# Patient Record
Sex: Male | Born: 1955 | Race: White | Hispanic: No | Marital: Married | Smoking: Current every day smoker
Health system: Southern US, Community
[De-identification: ages and names within clinical notes are randomized; demographics above are authoritative.]

## PROBLEM LIST (undated history)

## (undated) DIAGNOSIS — N529 Male erectile dysfunction, unspecified: Secondary | ICD-10-CM

## (undated) DIAGNOSIS — F419 Anxiety disorder, unspecified: Secondary | ICD-10-CM

## (undated) DIAGNOSIS — C801 Malignant (primary) neoplasm, unspecified: Secondary | ICD-10-CM

## (undated) DIAGNOSIS — F32A Depression, unspecified: Secondary | ICD-10-CM

## (undated) DIAGNOSIS — M1990 Unspecified osteoarthritis, unspecified site: Secondary | ICD-10-CM

## (undated) DIAGNOSIS — R519 Headache, unspecified: Secondary | ICD-10-CM

## (undated) DIAGNOSIS — F17200 Nicotine dependence, unspecified, uncomplicated: Secondary | ICD-10-CM

## (undated) DIAGNOSIS — M5030 Other cervical disc degeneration, unspecified cervical region: Secondary | ICD-10-CM

## (undated) DIAGNOSIS — M4800 Spinal stenosis, site unspecified: Secondary | ICD-10-CM

## (undated) DIAGNOSIS — R7303 Prediabetes: Secondary | ICD-10-CM

## (undated) DIAGNOSIS — K219 Gastro-esophageal reflux disease without esophagitis: Secondary | ICD-10-CM

## (undated) DIAGNOSIS — E785 Hyperlipidemia, unspecified: Secondary | ICD-10-CM

## (undated) DIAGNOSIS — I219 Acute myocardial infarction, unspecified: Secondary | ICD-10-CM

## (undated) DIAGNOSIS — Z9889 Other specified postprocedural states: Secondary | ICD-10-CM

## (undated) DIAGNOSIS — R972 Elevated prostate specific antigen [PSA]: Secondary | ICD-10-CM

## (undated) HISTORY — DX: Male erectile dysfunction, unspecified: N52.9

## (undated) HISTORY — DX: Elevated prostate specific antigen (PSA): R97.20

## (undated) HISTORY — DX: Nicotine dependence, unspecified, uncomplicated: F17.200

## (undated) HISTORY — DX: Prediabetes: R73.03

## (undated) HISTORY — DX: Hyperlipidemia, unspecified: E78.5

## (undated) HISTORY — DX: Gastro-esophageal reflux disease without esophagitis: K21.9

## (undated) HISTORY — PX: SPLENECTOMY: SUR1306

## (undated) HISTORY — DX: Other cervical disc degeneration, unspecified cervical region: M50.30

## (undated) HISTORY — PX: MANDIBLE FRACTURE SURGERY: SHX706

## (undated) HISTORY — PX: UMBILICAL HERNIA REPAIR: SHX196

## (undated) HISTORY — DX: Spinal stenosis, site unspecified: M48.00

## (undated) HISTORY — DX: Depression, unspecified: F32.A

---

## 1998-05-06 ENCOUNTER — Encounter: Payer: Self-pay | Admitting: Orthopedic Surgery

## 1998-05-06 ENCOUNTER — Ambulatory Visit (HOSPITAL_COMMUNITY): Admission: RE | Admit: 1998-05-06 | Discharge: 1998-05-06 | Payer: Self-pay | Admitting: Orthopedic Surgery

## 1998-06-15 ENCOUNTER — Ambulatory Visit (HOSPITAL_COMMUNITY): Admission: RE | Admit: 1998-06-15 | Discharge: 1998-06-15 | Payer: Self-pay | Admitting: Neurosurgery

## 1998-12-30 ENCOUNTER — Ambulatory Visit (HOSPITAL_COMMUNITY)
Admission: RE | Admit: 1998-12-30 | Discharge: 1998-12-30 | Payer: Self-pay | Admitting: Physical Medicine & Rehabilitation

## 1999-01-23 ENCOUNTER — Encounter: Payer: Self-pay | Admitting: Physical Medicine & Rehabilitation

## 1999-01-23 ENCOUNTER — Ambulatory Visit (HOSPITAL_COMMUNITY)
Admission: RE | Admit: 1999-01-23 | Discharge: 1999-01-23 | Payer: Self-pay | Admitting: Physical Medicine & Rehabilitation

## 1999-03-12 ENCOUNTER — Encounter: Payer: Self-pay | Admitting: Physical Medicine & Rehabilitation

## 1999-03-12 ENCOUNTER — Ambulatory Visit (HOSPITAL_COMMUNITY)
Admission: RE | Admit: 1999-03-12 | Discharge: 1999-03-12 | Payer: Self-pay | Admitting: Physical Medicine & Rehabilitation

## 1999-05-07 ENCOUNTER — Encounter: Payer: Self-pay | Admitting: Physical Medicine & Rehabilitation

## 1999-05-07 ENCOUNTER — Ambulatory Visit (HOSPITAL_COMMUNITY)
Admission: RE | Admit: 1999-05-07 | Discharge: 1999-05-07 | Payer: Self-pay | Admitting: Physical Medicine & Rehabilitation

## 2000-10-26 ENCOUNTER — Ambulatory Visit (HOSPITAL_COMMUNITY)
Admission: RE | Admit: 2000-10-26 | Discharge: 2000-10-26 | Payer: Self-pay | Admitting: Physical Medicine & Rehabilitation

## 2000-10-26 ENCOUNTER — Encounter: Payer: Self-pay | Admitting: Physical Medicine & Rehabilitation

## 2002-09-20 ENCOUNTER — Encounter
Admission: RE | Admit: 2002-09-20 | Discharge: 2002-12-19 | Payer: Self-pay | Admitting: Physical Medicine & Rehabilitation

## 2003-03-16 ENCOUNTER — Encounter
Admission: RE | Admit: 2003-03-16 | Discharge: 2003-06-14 | Payer: Self-pay | Admitting: Physical Medicine & Rehabilitation

## 2003-10-31 ENCOUNTER — Encounter
Admission: RE | Admit: 2003-10-31 | Discharge: 2004-01-29 | Payer: Self-pay | Admitting: Physical Medicine & Rehabilitation

## 2003-11-19 ENCOUNTER — Inpatient Hospital Stay (HOSPITAL_COMMUNITY): Admission: AC | Admit: 2003-11-19 | Discharge: 2003-12-02 | Payer: Self-pay

## 2003-11-23 ENCOUNTER — Encounter (INDEPENDENT_AMBULATORY_CARE_PROVIDER_SITE_OTHER): Payer: Self-pay | Admitting: *Deleted

## 2003-11-25 ENCOUNTER — Encounter (INDEPENDENT_AMBULATORY_CARE_PROVIDER_SITE_OTHER): Payer: Self-pay | Admitting: Specialist

## 2004-04-08 ENCOUNTER — Encounter
Admission: RE | Admit: 2004-04-08 | Discharge: 2004-07-07 | Payer: Self-pay | Admitting: Physical Medicine & Rehabilitation

## 2004-04-12 ENCOUNTER — Ambulatory Visit: Payer: Self-pay | Admitting: Physical Medicine & Rehabilitation

## 2005-02-19 ENCOUNTER — Ambulatory Visit: Payer: Self-pay | Admitting: Physical Medicine & Rehabilitation

## 2005-02-19 ENCOUNTER — Encounter
Admission: RE | Admit: 2005-02-19 | Discharge: 2005-05-20 | Payer: Self-pay | Admitting: Physical Medicine & Rehabilitation

## 2005-07-16 ENCOUNTER — Ambulatory Visit: Payer: Self-pay | Admitting: Physical Medicine & Rehabilitation

## 2005-07-16 ENCOUNTER — Encounter
Admission: RE | Admit: 2005-07-16 | Discharge: 2005-10-14 | Payer: Self-pay | Admitting: Physical Medicine & Rehabilitation
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
## 2006-01-06 ENCOUNTER — Encounter
Admission: RE | Admit: 2006-01-06 | Discharge: 2006-04-06 | Payer: Self-pay | Admitting: Physical Medicine & Rehabilitation

## 2006-01-06 ENCOUNTER — Ambulatory Visit: Payer: Self-pay | Admitting: Physical Medicine & Rehabilitation

## 2006-06-25 ENCOUNTER — Encounter
Admission: RE | Admit: 2006-06-25 | Discharge: 2006-09-23 | Payer: Self-pay | Admitting: Physical Medicine & Rehabilitation

## 2006-06-25 ENCOUNTER — Ambulatory Visit: Payer: Self-pay | Admitting: Physical Medicine & Rehabilitation

## 2006-12-16 ENCOUNTER — Ambulatory Visit: Payer: Self-pay | Admitting: Physical Medicine & Rehabilitation

## 2006-12-16 ENCOUNTER — Encounter
Admission: RE | Admit: 2006-12-16 | Discharge: 2006-12-17 | Payer: Self-pay | Admitting: Physical Medicine & Rehabilitation

## 2007-07-13 ENCOUNTER — Encounter
Admission: RE | Admit: 2007-07-13 | Discharge: 2007-07-15 | Payer: Self-pay | Admitting: Physical Medicine & Rehabilitation

## 2007-07-15 ENCOUNTER — Ambulatory Visit: Payer: Self-pay | Admitting: Physical Medicine & Rehabilitation

## 2008-01-04 ENCOUNTER — Encounter
Admission: RE | Admit: 2008-01-04 | Discharge: 2008-01-06 | Payer: Self-pay | Admitting: Physical Medicine & Rehabilitation

## 2008-01-06 ENCOUNTER — Ambulatory Visit: Payer: Self-pay | Admitting: Physical Medicine & Rehabilitation

## 2008-06-13 ENCOUNTER — Encounter
Admission: RE | Admit: 2008-06-13 | Discharge: 2008-06-16 | Payer: Self-pay | Admitting: Physical Medicine & Rehabilitation

## 2008-06-16 ENCOUNTER — Ambulatory Visit: Payer: Self-pay | Admitting: Physical Medicine & Rehabilitation

## 2009-06-06 ENCOUNTER — Emergency Department (HOSPITAL_COMMUNITY): Admission: EM | Admit: 2009-06-06 | Discharge: 2009-06-06 | Payer: Self-pay | Admitting: Family Medicine

## 2010-06-02 LAB — POCT I-STAT, CHEM 8
BUN: 18 mg/dL (ref 6–23)
Calcium, Ion: 1.11 mmol/L — ABNORMAL LOW (ref 1.12–1.32)
Chloride: 104 mEq/L (ref 96–112)
Glucose, Bld: 144 mg/dL — ABNORMAL HIGH (ref 70–99)
Hemoglobin: 16.7 g/dL (ref 13.0–17.0)
Potassium: 3.8 mEq/L (ref 3.5–5.1)
TCO2: 28 mmol/L (ref 0–100)

## 2010-07-23 NOTE — Assessment & Plan Note (Signed)
HISTORY:  Mr. Ethington returns to clinic today for follow up evaluation.  He reports that he continues to get benefits from the hydrocodone at  10/500 used approximately 5-7 tablets per day.  He does need a refill on  that in the office today.  He feels he may be experiencing some  tolerance to the Robaxin.  He is not getting the benefit that he had  been previously despite taking as much as 5,000 mg a day using a 500 mg  tablet.  He would like to try a different medication in place of the  Robaxin.  He also reports that he has had to increase his amitriptyline  to 3-4 tablets at night of the 25 mg strength.  He would like to  consolidate that prescription if possible.   MEDICATIONS:  1. Hydrocodone 10/500 1-2 tablets p.o. q.i.d. p.r.n. (maximum 6-7 per      day).  2. Robaxin 500 mg 3 tablets t.i.d. p.r.n. (approximately 9-10 per      day).  3. Amitriptyline 25 mg 3-4 tablets p.o. q.h.s.   REVIEW OF SYSTEMS:  Noncontributory.   PHYSICAL EXAMINATION:  GENERAL:  Well-appearing, well-nourished adult  male in mild acute discomfort.  VITAL SIGNS:  Blood pressure 135/70, pulse 72, respiratory rate 18, O2  saturation 97% on room air.  EXTREMITIES:  He has 5/5 strength throughout.  He ambulates without any  assistive device.  Cervical range of motion was decreased in all planes.   IMPRESSION:  Mild/moderate cervical spondylosis.   PLAN:  In the office today, we did continue the patient's hydrocodone as  noted above.  We did increase his amitriptyline to 100 mg tablet so that  he can take 1 tablet at night to cut down on the number of pills.  We  have discontinued the Robaxin as he is already at or above the maximum  dose per day.  Instead we have started him on Skelaxin 800 mg 1 tablet  q.i.d. p.r.n.   FOLLOW UP:  We will plan on seeing the patient in followup in  approximately 3-6 months' time with refills prior to that appointment if  necessary.     ______________________________  Ellwood Dense, M.D.     DC/MedQ  D:  07/15/2007 09:16:11  T:  07/15/2007 16:10:96  Job #:  [id number]
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
## 2010-07-23 NOTE — Assessment & Plan Note (Signed)
[name] returns to the clinic today for followup evaluation.  He is  not sure that he is getting as much benefit from the hydrocodone and  Soma that he had in the past.  He has actually increased his Soma up to  4-5 tablets per day, although he understands that the maximum was 3  tablets per day.  He has been using his hydrocodone 10/500 approximately  7 tablets per day, but still not getting the same relief that he had  previously.  He was unable to afford the Skelaxin due to the high  pharmacy cost.  He also had tried Robaxin and Flexeril in the past along  with Ultram without significant benefit.   The patient also reports that he would like slight increase in his  amitriptyline for insomnia.   MEDICATIONS:  1. Hydrocodone 10/500 one to two tablets p.o. q.i.d. p.r.n.      (approximately 7 per day).  2. Soma 350 mg 1 tablet t.i.d. p.r.n.  3. Amitriptyline 25 two to three tablets p.o. nightly.   REVIEW OF SYSTEMS:  Noncontributory.   PHYSICAL EXAMINATION:  GENERAL:  Well-appearing, well-nourished, adult  male in mild acute discomfort.  VITAL SIGNS:  Blood pressure 144/70 with pulse 71, respiratory rate 16,  and O2 saturation 96% on room air.  MUSCULOSKELETAL:  He has 5/5 strength throughout.  He ambulates without  any assistive device. Cervical range of motion was decreased in all  planes.   IMPRESSION:  Mild/moderate cervical spondylosis.   In the office today we did change the patient's hydrocodone to a 10/325  strength and increased the frequency up to 9 times per day, total  tablets.  That prescription was refilled for January 29, 2008, and he  will use up these 10/500 mg tablets that he has at present.  We also  refilled his Soma for him today in the office without change and asked  him to make sure he uses them more than 3 per day.  We increased his  amitriptyline up to 100 mg p.o. nightly p.r.n. for insomnia.  We will  plan on seeing the patient in followup in  approximately 6 months' time.  He has trouble affording the facility fee, which is not paid for by his  insurance with more frequent visits.           ______________________________  [name], M.D.     DC/MedQ  D:  01/06/2008 09:33:18  T:  01/06/2008 23:42:57  Job #:  [id number]
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
## 2015-10-05 DIAGNOSIS — E785 Hyperlipidemia, unspecified: Secondary | ICD-10-CM | POA: Diagnosis not present

## 2015-10-05 DIAGNOSIS — R7309 Other abnormal glucose: Secondary | ICD-10-CM | POA: Diagnosis not present

## 2015-10-12 DIAGNOSIS — R7309 Other abnormal glucose: Secondary | ICD-10-CM | POA: Diagnosis not present

## 2015-10-12 DIAGNOSIS — E785 Hyperlipidemia, unspecified: Secondary | ICD-10-CM | POA: Diagnosis not present

## 2015-10-12 DIAGNOSIS — F17210 Nicotine dependence, cigarettes, uncomplicated: Secondary | ICD-10-CM | POA: Diagnosis not present

## 2015-10-12 DIAGNOSIS — F339 Major depressive disorder, recurrent, unspecified: Secondary | ICD-10-CM | POA: Diagnosis not present

## 2016-01-18 DIAGNOSIS — Z131 Encounter for screening for diabetes mellitus: Secondary | ICD-10-CM | POA: Diagnosis not present

## 2016-01-18 DIAGNOSIS — E785 Hyperlipidemia, unspecified: Secondary | ICD-10-CM | POA: Diagnosis not present

## 2016-01-24 DIAGNOSIS — Z23 Encounter for immunization: Secondary | ICD-10-CM | POA: Diagnosis not present

## 2016-01-24 DIAGNOSIS — E785 Hyperlipidemia, unspecified: Secondary | ICD-10-CM | POA: Diagnosis not present

## 2016-04-14 ENCOUNTER — Encounter (INDEPENDENT_AMBULATORY_CARE_PROVIDER_SITE_OTHER): Payer: Self-pay | Admitting: Physician Assistant

## 2016-04-14 ENCOUNTER — Ambulatory Visit (INDEPENDENT_AMBULATORY_CARE_PROVIDER_SITE_OTHER): Payer: BLUE CROSS/BLUE SHIELD

## 2016-04-14 ENCOUNTER — Ambulatory Visit (INDEPENDENT_AMBULATORY_CARE_PROVIDER_SITE_OTHER): Payer: Self-pay

## 2016-04-14 ENCOUNTER — Ambulatory Visit (INDEPENDENT_AMBULATORY_CARE_PROVIDER_SITE_OTHER): Payer: BLUE CROSS/BLUE SHIELD | Admitting: Physician Assistant

## 2016-04-14 DIAGNOSIS — M25561 Pain in right knee: Secondary | ICD-10-CM

## 2016-04-14 DIAGNOSIS — M25511 Pain in right shoulder: Secondary | ICD-10-CM

## 2016-04-14 DIAGNOSIS — M25572 Pain in left ankle and joints of left foot: Secondary | ICD-10-CM

## 2016-04-14 MED ORDER — DICLOFENAC SODIUM 75 MG PO TBEC: 75.0000 mg | DELAYED_RELEASE_TABLET | Freq: Two times a day (BID) | ORAL | 0 refills | Status: DC

## 2016-04-14 MED ORDER — LIDOCAINE HCL 1 % IJ SOLN
3.0000 mL | INTRAMUSCULAR | Status: AC | PRN
  Administered 2016-04-14: 3 mL

## 2016-04-14 MED ORDER — METHYLPREDNISOLONE ACETATE 40 MG/ML IJ SUSP
40.0000 mg | INTRAMUSCULAR | Status: AC | PRN
  Administered 2016-04-14: 40 mg via INTRA_ARTICULAR

## 2016-04-14 NOTE — Progress Notes (Signed)
Office Visit Note   Patient: [name]           Date of Birth: 1955-09-21           MRN: [id number] Visit Date: 04/14/2016              Requested by: No referring provider defined for this encounter. PCP: Thayer Headings, MD   Assessment & Plan: Visit Diagnoses:  1. Acute pain of right knee   2. Acute pain of right shoulder   3. Pain in left ankle and joints of left foot     Plan: Quad strengthening exercises discussed. Wall crawls and forward flexion exercises for the shoulder. Start him on diclofenac no other NSAID's while on diclofenac.  Follow-Up Instructions: Return in about 2 weeks (around 04/28/2016).   Orders:  Orders Placed This Encounter  Procedures  . Large Joint Injection/Arthrocentesis  . Large Joint Injection/Arthrocentesis  . XR Knee 1-2 Views Right  . XR Shoulder Right   Meds ordered this encounter  Medications  . diclofenac (VOLTAREN) 75 MG EC tablet    Sig: Take 1 tablet (75 mg total) by mouth 2 (two) times daily.    Dispense:  60 tablet    Refill:  0      Procedures: Large Joint Inj Date/Time: 04/14/2016 5:14 PM Performed by: Kirtland Bouchard Authorized by: Kirtland Bouchard   Consent Given by:  Patient Indications:  Pain Location:  Knee Site:  R knee Needle Size:  22 G Approach:  Anterolateral Ultrasound Guidance: No   Fluoroscopic Guidance: No   Medications:  40 mg methylPREDNISolone acetate 40 MG/ML; 3 mL lidocaine 1 % Aspiration Attempted: No   Patient tolerance:  Patient tolerated the procedure well with no immediate complications Large Joint Inj Date/Time: 04/14/2016 5:15 PM Performed by: Kirtland Bouchard Authorized by: Kirtland Bouchard   Consent Given by:  Patient Indications:  Pain Location:  Shoulder Site:  R subacromial bursa Needle Size:  22 G Needle Length:  1.5 inches Approach:  Lateral Ultrasound Guidance: No   Fluoroscopic Guidance: No   Arthrogram: No   Medications:  40 mg methylPREDNISolone acetate 40 MG/ML;  3 mL lidocaine 1 % Aspiration Attempted: No   Patient tolerance:  Patient tolerated the procedure well with no immediate complications     Clinical Data: No additional findings.   Subjective: Chief Complaint  Patient presents with  . Right Knee - Pain  . Right Shoulder - Pain    HPI Left ankle , right shoulder and right knee pain for last couple of weeks. Feels left ankle pain is getting better and is an over use issue of going up and down stairs. Denies any radicular symptoms down either arm. 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 Has tried NSAID'S for all the above with no relief.  Review of Systems   Objective: Vital Signs: There were no vitals taken for this visit.  Physical Exam  Constitutional: He is oriented to person, place, and time. He appears well-developed and well-nourished. No distress.  Cardiovascular: Intact distal pulses.   Pulmonary/Chest: Effort normal.  Neurological: He is alert and oriented to person, place, and time.  Skin: He is not diaphoretic.  Psychiatric: He has a normal mood and affect. His behavior is normal.    Ortho Exam Bilateral shoulders full forward flexion on the left lacks last 10 degrees right shoulder. Right shoulder positive impingement . Negative empty can test  bilateral. 5/5 Strengths with external /internal rotation against resistance bilateral shoulders. Tenderness over right shoulder greater tuberosity and trapezius region.  Right knee no effusion , abnormal warmth or erythema, Full range of motion right knee without pain. No instability right knee, Tenderness over medial joint line. McMurray sign negative. Left ankle tenderness over sinus tarsi . Non tender over posterior tibial tendon, peroneal tendons, achilles tendon. Good range of motion left ankle without pain. No edema , rashes , skin lesions left ankle.  Specialty Comments:  No specialty comments  available.  Imaging: Xr Knee 1-2 Views Right  Result Date: 04/14/2016 Right knee medial compartment moderate narrowing. No acute fracture. Lateral and patellar femoral compartments well maintained.   Xr Shoulder Right  Result Date: 04/14/2016 Right shoulder 3 views: Calcification at rotator cuff insertion site. No acute fracture. Subacromial and glenohumeral joints maintained. Humerus well located.     PMFS History: There are no active problems to display for this patient.  No past medical history on file.  No family history on file.  No past surgical history on file. Social History   Occupational History  . Not on file.   Social History Main Topics  . Smoking status: Current Every Day Smoker  . Smokeless tobacco: Never Used  . Alcohol use Not on file  . Drug use: Unknown  . Sexual activity: Not on file

## 2016-04-18 DIAGNOSIS — E785 Hyperlipidemia, unspecified: Secondary | ICD-10-CM | POA: Diagnosis not present

## 2016-04-23 DIAGNOSIS — F339 Major depressive disorder, recurrent, unspecified: Secondary | ICD-10-CM | POA: Diagnosis not present

## 2016-04-23 DIAGNOSIS — R05 Cough: Secondary | ICD-10-CM | POA: Diagnosis not present

## 2016-04-23 DIAGNOSIS — R739 Hyperglycemia, unspecified: Secondary | ICD-10-CM | POA: Diagnosis not present

## 2016-04-23 DIAGNOSIS — E785 Hyperlipidemia, unspecified: Secondary | ICD-10-CM | POA: Diagnosis not present

## 2016-05-01 ENCOUNTER — Ambulatory Visit (INDEPENDENT_AMBULATORY_CARE_PROVIDER_SITE_OTHER): Payer: BLUE CROSS/BLUE SHIELD | Admitting: Physician Assistant

## 2016-05-01 ENCOUNTER — Encounter (INDEPENDENT_AMBULATORY_CARE_PROVIDER_SITE_OTHER): Payer: Self-pay | Admitting: Physician Assistant

## 2016-05-01 DIAGNOSIS — M25561 Pain in right knee: Secondary | ICD-10-CM

## 2016-05-01 DIAGNOSIS — M25511 Pain in right shoulder: Secondary | ICD-10-CM

## 2016-05-01 NOTE — Progress Notes (Signed)
   Office Visit Note   Patient: [name]           Date of Birth: 1955-04-03           MRN: [id number] Visit Date: 05/01/2016              Requested by: Dalton HeadingsBrian Mackenzie, MD [address], [address] [place] [id number] PCP: Dalton HeadingsMACKENZIE,BRIAN, MD   Assessment & Plan: Visit Diagnoses:  1. Acute pain of right knee   2. Acute pain of right shoulder     Plan: Discussed with him for him to continue doing the wall crawls pendulum exercises, exercises for his right shoulder. He'll continue quad strengthening right knee and discuss with the knee friendly exercises. We'll see him back on a when necessary basis. He develops any further mechanical symptoms are more frequency of giving way of the knee would recommend he follow up.  Follow-Up Instructions: Return if symptoms worsen or fail to improve.   Orders:  No orders of the defined types were placed in this encounter.  No orders of the defined types were placed in this encounter.     Procedures: No procedures performed   Clinical Data: No additional findings.   Subjective: Chief Complaint  Patient presents with  . Right Knee - Pain, Follow-up  . Right Shoulder - Pain, Follow-up  . Left Foot - Pain, Follow-up    HPI Mr.[name] anterior cruciate ligament overall he still doing much better. He states that his ankle is doing well. Feels diclofenac is helped with his ankle knee and his shoulder. He has some sensation of the right knee giving way at times but this is a very seldom. No other mechanical symptoms of the knee. Review of Systems   Objective: Vital Signs: There were no vitals taken for this visit.  Physical Exam  Constitutional: He is oriented to person, place, and time. He appears well-developed and well-nourished. No distress.  Pulmonary/Chest: Effort normal.  Neurological: He is alert and oriented to person, place, and time.  Psychiatric: He has a normal mood and affect. His behavior is normal.     Ortho Exam Right shoulder he has full forward flexion. Negative impingement testing. 5 out of 5 strengths with external and internal rotation against resistance. Right knee has good range of motion of the right knee no effusion no abnormal warmth no erythema no instability.Right knee minimal tenderness over the medial joint line. Specialty Comments:  No specialty comments available.  Imaging: No results found.   PMFS History: There are no active problems to display for this patient.  No past medical history on file.  No family history on file.  No past surgical history on file. Social History   Occupational History  . Not on file.   Social History Main Topics  . Smoking status: Current Every Day Smoker  . Smokeless tobacco: Never Used  . Alcohol use Not on file  . Drug use: Unknown  . Sexual activity: Not on file

## 2016-05-18 ENCOUNTER — Other Ambulatory Visit (INDEPENDENT_AMBULATORY_CARE_PROVIDER_SITE_OTHER): Payer: Self-pay | Admitting: Physician Assistant

## 2016-06-21 ENCOUNTER — Other Ambulatory Visit (INDEPENDENT_AMBULATORY_CARE_PROVIDER_SITE_OTHER): Payer: Self-pay | Admitting: Orthopaedic Surgery

## 2016-06-28 ENCOUNTER — Encounter (HOSPITAL_COMMUNITY): Payer: Self-pay | Admitting: Nurse Practitioner

## 2016-06-28 ENCOUNTER — Emergency Department (HOSPITAL_COMMUNITY)
Admission: EM | Admit: 2016-06-28 | Discharge: 2016-06-28 | Disposition: A | Discharge status: Home / Self Care | Payer: BLUE CROSS/BLUE SHIELD | Attending: Emergency Medicine | Admitting: Emergency Medicine

## 2016-06-28 DIAGNOSIS — F17210 Nicotine dependence, cigarettes, uncomplicated: Secondary | ICD-10-CM | POA: Diagnosis not present

## 2016-06-28 DIAGNOSIS — Z79899 Other long term (current) drug therapy: Secondary | ICD-10-CM | POA: Insufficient documentation

## 2016-06-28 DIAGNOSIS — R197 Diarrhea, unspecified: Secondary | ICD-10-CM | POA: Diagnosis not present

## 2016-06-28 DIAGNOSIS — K929 Disease of digestive system, unspecified: Secondary | ICD-10-CM | POA: Diagnosis present

## 2016-06-28 DIAGNOSIS — R112 Nausea with vomiting, unspecified: Secondary | ICD-10-CM | POA: Diagnosis not present

## 2016-06-28 LAB — URINALYSIS, ROUTINE W REFLEX MICROSCOPIC
Bacteria, UA: NONE SEEN
Bilirubin Urine: NEGATIVE
GLUCOSE, UA: NEGATIVE mg/dL
HGB URINE DIPSTICK: NEGATIVE
Ketones, ur: 5 mg/dL — AB
Leukocytes, UA: NEGATIVE
NITRITE: NEGATIVE
PH: 7 (ref 5.0–8.0)
Protein, ur: 30 mg/dL — AB
SPECIFIC GRAVITY, URINE: 1.019 (ref 1.005–1.030)
WBC UA: NONE SEEN WBC/hpf (ref 0–5)

## 2016-06-28 LAB — CBC
HCT: 46.1 % (ref 39.0–52.0)
HEMOGLOBIN: 16 g/dL (ref 13.0–17.0)
MCH: 31.1 pg (ref 26.0–34.0)
MCHC: 34.7 g/dL (ref 30.0–36.0)
MCV: 89.5 fL (ref 78.0–100.0)
Platelets: 432 10*3/uL — ABNORMAL HIGH (ref 150–400)
RBC: 5.15 MIL/uL (ref 4.22–5.81)
RDW: 13.9 % (ref 11.5–15.5)
WBC: 16.5 10*3/uL — ABNORMAL HIGH (ref 4.0–10.5)

## 2016-06-28 LAB — COMPREHENSIVE METABOLIC PANEL
ALBUMIN: 4.3 g/dL (ref 3.5–5.0)
ALT: 35 U/L (ref 17–63)
ANION GAP: 12 (ref 5–15)
AST: 26 U/L (ref 15–41)
Alkaline Phosphatase: 85 U/L (ref 38–126)
BUN: 18 mg/dL (ref 6–20)
CO2: 24 mmol/L (ref 22–32)
Calcium: 9.9 mg/dL (ref 8.9–10.3)
Chloride: 102 mmol/L (ref 101–111)
Creatinine, Ser: 0.8 mg/dL (ref 0.61–1.24)
GFR calc Af Amer: >60 mL/min (ref >60)
GFR calc non Af Amer: >60 mL/min (ref >60)
GLUCOSE: 161 mg/dL — AB (ref 65–99)
POTASSIUM: 3.6 mmol/L (ref 3.5–5.1)
SODIUM: 138 mmol/L (ref 135–145)
Total Bilirubin: 0.8 mg/dL (ref 0.3–1.2)
Total Protein: 8.1 g/dL (ref 6.5–8.1)

## 2016-06-28 MED ORDER — ONDANSETRON 4 MG PO TBDP
ORAL_TABLET | ORAL | Status: AC
  Filled 2016-06-28: qty 1

## 2016-06-28 MED ORDER — ONDANSETRON 4 MG PO TBDP
4.0000 mg | ORAL_TABLET | Freq: Once | ORAL | Status: AC | PRN
  Administered 2016-06-28: 4 mg via ORAL

## 2016-06-28 MED ORDER — ONDANSETRON 4 MG PO TBDP: ORAL_TABLET | ORAL | 0 refills | Status: DC

## 2016-06-28 MED ORDER — SODIUM CHLORIDE 0.9 % IV BOLUS (SEPSIS)
1000.0000 mL | Freq: Once | INTRAVENOUS | Status: AC
Start: 2016-06-28 — End: 2016-06-28
  Administered 2016-06-28: 1000 mL via INTRAVENOUS

## 2016-06-28 NOTE — ED Provider Notes (Signed)
[facility]ENCY DEPT Provider Note   CSN: [id number] Arrival date & time: 06/28/16  1319     History   Chief Complaint Chief Complaint  Patient presents with  . GI Problem    HPI [name] is a 61 y.o. male.  The history is provided by the patient, the spouse and medical records.  GI Problem  This is a recurrent problem. The current episode started yesterday. The problem occurs constantly. The problem has been resolved (after Zofran in Triage). Pertinent negatives include no chest pain, no abdominal pain, no headaches and no shortness of breath. Nothing aggravates the symptoms. Nothing relieves the symptoms. He has tried nothing for the symptoms.    History reviewed. No pertinent past medical history.  There are no active problems to display for this patient.   History reviewed. No pertinent surgical history.     Home Medications    Prior to Admission medications   Medication Sig Start Date End Date Taking? Authorizing Provider  atorvastatin (LIPITOR) 20 MG tablet  04/23/16   Historical Provider, MD  diclofenac (VOLTAREN) 75 MG EC tablet TAKE 1 TABLET BY MOUTH TWICE DAILY 06/23/16   Kathryne Hitch, MD  sertraline (ZOLOFT) 100 MG tablet  03/14/16   Historical Provider, MD    Family History History reviewed. No pertinent family history.  Social History Social History  Substance Use Topics  . Smoking status: Current Every Day Smoker    Types: Cigarettes  . Smokeless tobacco: Never Used  . Alcohol use Yes     Allergies   Patient has no known allergies.   Review of Systems Review of Systems  Constitutional: Positive for chills. Negative for fever.  HENT: Negative for ear pain and sore throat.   Eyes: Negative for pain and visual disturbance.  Respiratory: Negative for cough and shortness of breath.   Cardiovascular: Negative for chest pain and palpitations.  Gastrointestinal: Positive for diarrhea, nausea and vomiting. Negative for abdominal pain.    Genitourinary: Negative for dysuria and hematuria.  Musculoskeletal: Negative for arthralgias and back pain.  Skin: Negative for color change and rash.  Neurological: Negative for seizures, syncope and headaches.  All other systems reviewed and are negative.    Physical Exam Updated Vital Signs BP 113/74   Pulse 73   Temp 98.7 F (37.1 C) (Oral)   Resp 16   SpO2 96%   Physical Exam  Constitutional: He appears well-developed and well-nourished.  HENT:  Head: Normocephalic and atraumatic.  Eyes: Conjunctivae are normal.  Neck: Neck supple.  Cardiovascular: Normal rate and regular rhythm.   No murmur heard. Pulmonary/Chest: Effort normal and breath sounds normal. No respiratory distress.  Abdominal: Soft. He exhibits no distension and no mass. There is no tenderness.  Musculoskeletal: He exhibits no edema.  Neurological: He is alert.  Skin: Skin is warm and dry.  Psychiatric: He has a normal mood and affect.  Nursing note and vitals reviewed.    ED Treatments / Results  Labs (all labs ordered are listed, but only abnormal results are displayed) Labs Reviewed  COMPREHENSIVE METABOLIC PANEL - Abnormal; Notable for the following:       Result Value   Glucose, Bld 161 (*)    All other components within normal limits  CBC - Abnormal; Notable for the following:    WBC 16.5 (*)    Platelets 432 (*)    All other components within normal limits  URINALYSIS, ROUTINE W REFLEX MICROSCOPIC - Abnormal; Notable for the following:  APPearance CLOUDY (*)    Ketones, ur 5 (*)    Protein, ur 30 (*)    Squamous Epithelial / LPF 0-5 (*)    All other components within normal limits    EKG  EKG Interpretation None       Radiology No results found.  Procedures Procedures (including critical care time)  Medications Ordered in ED Medications  ondansetron (ZOFRAN-ODT) disintegrating tablet 4 mg (4 mg Oral Given 06/28/16 1343)     Initial Impression / Assessment and Plan  / ED Course  I have reviewed the triage vital signs and the nursing notes.  Pertinent labs & imaging results that were available during my care of the patient were reviewed by me and considered in my medical decision making (see chart for details).    Pt presents with N/V/D. Says his symptoms started last night & have been persistent; his wife says he's been vomiting all day & he notes that he's had several loose stools today. Endorses subjective chills, but denies fevers, HA, lightheadedness, cough, CP, SOB, abdominal pain, urinary symptoms, recent illness, sick contacts, or suspicious foods. Pt is asymptomatic now after receiving Zofran in Triage.  VS & exam as above. Labs remarkable for WBC 16.5; CBC appears hemoconcentrated w/Hgb 16 & PLT 432. NS bolus given in the ED. Suspect viral gastroenteritis as the etiology of the Pt's symptoms; recommending supportive treatment & anti-emetics at home.  Explained all results to the Pt. Will discharge the Pt home with prescription for Zofran. Recommending follow-up with PCP. ED return precautions provided. Pt acknowledged understanding of, and concurrence with the plan. All questions answered to his satisfaction. In stable condition at the time of discharge.  Final Clinical Impressions(s) / ED Diagnoses   Final diagnoses:  Nausea vomiting and diarrhea    New Prescriptions New Prescriptions   No medications on file     Forest Becker, MD 06/28/16 1759    Blane Ohara, MD 06/28/16 2359

## 2016-06-28 NOTE — ED Triage Notes (Signed)
Pt presents with c/o GI problem. He reports several episodes of N/V/D over the past 3 weeks. He began to have n/v/d again this morning. He reports chills, lower abdominal pain. He denies urinary frequency, dysuria. hes tired Tums, Rolaids, Pepto bismol with no relief.

## 2016-06-28 NOTE — Discharge Instructions (Signed)
If you were given medicines take as directed.  If you are on coumadin or contraceptives realize their levels and effectiveness is altered by many different medicines.  If you have any reaction (rash, tongues swelling, other) to the medicines stop taking and see a physician.    If your blood pressure was elevated in the ER make sure you follow up for management with a primary doctor or return for chest pain, shortness of breath or stroke symptoms.  Please follow up as directed and return to the ER or see a physician for new or worsening symptoms.  Thank you. Vitals:   06/28/16 1335 06/28/16 1420 06/28/16 1430 06/28/16 1500  BP: (!) 156/106 113/74 132/83 124/76  Pulse: 60 73 72 94  Resp: 17 16    Temp: 98.7 F (37.1 C)     TempSrc: Oral     SpO2: 100% 96% 95% 94%

## 2016-06-30 ENCOUNTER — Other Ambulatory Visit (INDEPENDENT_AMBULATORY_CARE_PROVIDER_SITE_OTHER): Payer: Self-pay | Admitting: Orthopaedic Surgery

## 2016-07-11 DIAGNOSIS — I10 Essential (primary) hypertension: Secondary | ICD-10-CM | POA: Diagnosis not present

## 2016-07-11 DIAGNOSIS — Z0000 Encounter for general adult medical examination without abnormal findings: Secondary | ICD-10-CM | POA: Diagnosis not present

## 2016-07-11 DIAGNOSIS — Z131 Encounter for screening for diabetes mellitus: Secondary | ICD-10-CM | POA: Diagnosis not present

## 2016-07-11 DIAGNOSIS — Z125 Encounter for screening for malignant neoplasm of prostate: Secondary | ICD-10-CM | POA: Diagnosis not present

## 2016-07-24 DIAGNOSIS — M1990 Unspecified osteoarthritis, unspecified site: Secondary | ICD-10-CM | POA: Diagnosis not present

## 2016-07-24 DIAGNOSIS — E785 Hyperlipidemia, unspecified: Secondary | ICD-10-CM | POA: Diagnosis not present

## 2016-07-24 DIAGNOSIS — F339 Major depressive disorder, recurrent, unspecified: Secondary | ICD-10-CM | POA: Diagnosis not present

## 2016-07-24 DIAGNOSIS — Z0000 Encounter for general adult medical examination without abnormal findings: Secondary | ICD-10-CM | POA: Diagnosis not present

## 2016-09-01 ENCOUNTER — Other Ambulatory Visit (INDEPENDENT_AMBULATORY_CARE_PROVIDER_SITE_OTHER): Payer: Self-pay | Admitting: Orthopaedic Surgery

## 2016-10-07 ENCOUNTER — Other Ambulatory Visit (INDEPENDENT_AMBULATORY_CARE_PROVIDER_SITE_OTHER): Payer: Self-pay | Admitting: Orthopaedic Surgery

## 2016-10-08 ENCOUNTER — Ambulatory Visit (INDEPENDENT_AMBULATORY_CARE_PROVIDER_SITE_OTHER): Payer: Self-pay

## 2016-10-08 ENCOUNTER — Ambulatory Visit (INDEPENDENT_AMBULATORY_CARE_PROVIDER_SITE_OTHER): Payer: BLUE CROSS/BLUE SHIELD | Admitting: Orthopaedic Surgery

## 2016-10-08 DIAGNOSIS — M5442 Lumbago with sciatica, left side: Secondary | ICD-10-CM | POA: Diagnosis not present

## 2016-10-08 DIAGNOSIS — M5441 Lumbago with sciatica, right side: Secondary | ICD-10-CM | POA: Diagnosis not present

## 2016-10-08 MED ORDER — METHYLPREDNISOLONE 4 MG PO TABS: ORAL_TABLET | ORAL | 0 refills | Status: DC

## 2016-10-08 MED ORDER — GABAPENTIN 300 MG PO CAPS: 300.0000 mg | ORAL_CAPSULE | Freq: Every day | ORAL | 0 refills | Status: DC

## 2016-10-08 MED ORDER — TIZANIDINE HCL 4 MG PO TABS: 4.0000 mg | ORAL_TABLET | Freq: Three times a day (TID) | ORAL | 0 refills | Status: DC | PRN

## 2016-10-08 NOTE — Progress Notes (Signed)
Office Visit Note   Patient: [name]           Date of Birth: 1956-01-07           MRN: [id number] Visit Date: 10/08/2016              Requested by: Thayer HeadingsMackenzie, Brian, MD [address], [address] [place] [id number] PCP: Thayer HeadingsMackenzie, Brian, MD   Assessment & Plan: Visit Diagnoses:  1. 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 I will put him on a six-day steroid taper and have him continue 600 mg of ibuprofen 3 times a day with meals. I will try gabapentin once a day as well as Zanaflex. All questions were encouraged and answered. We will see him back as MRI is obtained. If things are worsening he will let us know.  Follow-Up Instructions: Return in about 2 weeks (around 10/22/2016).   Orders:  Orders Placed This Encounter  Procedures  . XR Lumbar Spine 2-3 Views   Meds ordered this encounter  Medications  . methylPREDNISolone (MEDROL) 4 MG tablet    Sig: Medrol dose pack. Take as instructed    Dispense:  21 tablet    Refill:  0  . gabapentin (NEURONTIN) 300 MG capsule    Sig: Take 1 capsule (300 mg total) by mouth at bedtime.    Dispense:  30 capsule    Refill:  0  . tiZANidine (ZANAFLEX) 4 MG tablet    Sig: Take 1 tablet (4 mg total) by mouth every 8 (eight) hours as needed for muscle spasms.    Dispense:  30 tablet    Refill:  0      Procedures: No procedures performed   Clinical Data: No additional findings.   Subjective: No chief complaint on file. The patient is an established patient of our practice. He comes in with several week history of worsening low back pain that is causing burning sensations in his thighs and his backside bilaterally. He is not a diabetic. This is been slowly getting worse with time but has had  a significant acute flare up. He denies any change in bowel or bladder function or weakness in his legs but has severe pain in his back. Again he also has a radicular symptoms going down both legs.  HPI  Review of Systems He currently denies any change in bowel or bladder function. He denies any headache, chest pain, shortness of breath, fever, chills, nausea, vomiting  Objective: Vital Signs: There were no vitals taken for this visit.  Physical Exam On physical exam he is alert and oriented 3 and in no acute distress but obvious discomfort Ortho Exam He has significant limitations in flexion extension of his lumbar spine due to pain. He is very tight hamstrings. He is subjective numbness on both his feet on the bottom of each foot. He's got good strength in his bilateral lower extremities. He has definitely sciatic on both sides. Specialty Comments:  No specialty comments available.  Imaging: Xr Lumbar Spine 2-3 Views  Result Date: 10/08/2016 2 views of lumbar spine shows significant spondylosis at with degenerative disc disease at multiple levels and significant osteophytes. There is definitely evidence of foraminal stenosis as well.    PMFS History: There are  no active problems to display for this patient.  No past medical history on file.  No family history on file.  No past surgical history on file. Social History   Occupational History  . Not on file.   Social History Main Topics  . Smoking status: Current Every Day Smoker    Types: Cigarettes  . Smokeless tobacco: Never Used  . Alcohol use Yes  . Drug use: No  . Sexual activity: Not on file

## 2016-10-09 ENCOUNTER — Other Ambulatory Visit (INDEPENDENT_AMBULATORY_CARE_PROVIDER_SITE_OTHER): Payer: Self-pay

## 2016-10-09 DIAGNOSIS — M4807 Spinal stenosis, lumbosacral region: Secondary | ICD-10-CM

## 2016-10-17 ENCOUNTER — Other Ambulatory Visit (INDEPENDENT_AMBULATORY_CARE_PROVIDER_SITE_OTHER): Payer: Self-pay | Admitting: Orthopaedic Surgery

## 2016-10-19 ENCOUNTER — Other Ambulatory Visit (INDEPENDENT_AMBULATORY_CARE_PROVIDER_SITE_OTHER): Payer: Self-pay | Admitting: Orthopaedic Surgery

## 2016-10-20 ENCOUNTER — Telehealth (INDEPENDENT_AMBULATORY_CARE_PROVIDER_SITE_OTHER): Payer: Self-pay | Admitting: Orthopaedic Surgery

## 2016-10-20 NOTE — Telephone Encounter (Signed)
Please advise 

## 2016-10-20 NOTE — Telephone Encounter (Signed)
Pt would like to know if he can have a refill of tizanidine please.  [phone]

## 2016-10-20 NOTE — Telephone Encounter (Signed)
Already got a message about this - yes

## 2016-10-30 ENCOUNTER — Ambulatory Visit (INDEPENDENT_AMBULATORY_CARE_PROVIDER_SITE_OTHER): Payer: BLUE CROSS/BLUE SHIELD | Admitting: Orthopaedic Surgery

## 2016-10-31 ENCOUNTER — Ambulatory Visit
Admission: RE | Admit: 2016-10-31 | Discharge: 2016-10-31 | Disposition: A | Discharge status: Home / Self Care | Payer: BLUE CROSS/BLUE SHIELD | Source: Ambulatory Visit | Attending: Orthopaedic Surgery | Admitting: Orthopaedic Surgery

## 2016-10-31 DIAGNOSIS — M48061 Spinal stenosis, lumbar region without neurogenic claudication: Secondary | ICD-10-CM | POA: Diagnosis not present

## 2016-10-31 DIAGNOSIS — M4807 Spinal stenosis, lumbosacral region: Secondary | ICD-10-CM

## 2016-11-03 ENCOUNTER — Other Ambulatory Visit (INDEPENDENT_AMBULATORY_CARE_PROVIDER_SITE_OTHER): Payer: Self-pay | Admitting: Orthopaedic Surgery

## 2016-11-04 ENCOUNTER — Ambulatory Visit (INDEPENDENT_AMBULATORY_CARE_PROVIDER_SITE_OTHER): Payer: BLUE CROSS/BLUE SHIELD | Admitting: Orthopaedic Surgery

## 2016-11-04 ENCOUNTER — Ambulatory Visit (INDEPENDENT_AMBULATORY_CARE_PROVIDER_SITE_OTHER): Payer: Self-pay

## 2016-11-04 DIAGNOSIS — M5442 Lumbago with sciatica, left side: Secondary | ICD-10-CM | POA: Diagnosis not present

## 2016-11-04 DIAGNOSIS — G8929 Other chronic pain: Secondary | ICD-10-CM

## 2016-11-04 DIAGNOSIS — M542 Cervicalgia: Secondary | ICD-10-CM | POA: Diagnosis not present

## 2016-11-04 DIAGNOSIS — M5441 Lumbago with sciatica, right side: Secondary | ICD-10-CM

## 2016-11-04 MED ORDER — TRAMADOL HCL 50 MG PO TABS: 100.0000 mg | ORAL_TABLET | Freq: Three times a day (TID) | ORAL | 0 refills | Status: DC | PRN

## 2016-11-04 MED ORDER — TIZANIDINE HCL 4 MG PO TABS: 4.0000 mg | ORAL_TABLET | Freq: Three times a day (TID) | ORAL | 0 refills | Status: DC | PRN

## 2016-11-04 MED ORDER — GABAPENTIN 300 MG PO CAPS: 300.0000 mg | ORAL_CAPSULE | Freq: Two times a day (BID) | ORAL | 0 refills | Status: DC

## 2016-11-04 MED ORDER — DICLOFENAC SODIUM 75 MG PO TBEC: 75.0000 mg | DELAYED_RELEASE_TABLET | Freq: Two times a day (BID) | ORAL | 3 refills | Status: DC

## 2016-11-04 NOTE — Progress Notes (Signed)
Office Visit Note   Patient: [name]           Date of Birth: 05-23-1955           MRN: [id number] Visit Date: 11/04/2016              Requested by: Thayer Headings, MD [address], [address] [place], [place] [address] PCP: Thayer Headings, MD   Assessment & Plan: Visit Diagnoses:  1. Neck pain   2. Chronic bilateral low back pain with bilateral sciatica     Plan: I believe he would definitely benefit from a multifocal approach including physical therapy on the cervical and lumbar spines for traction and other modalities to improve his function and decrease his pain. This will be the therapist's discretion. 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 I will increase his Neurontin to twice a day and try some tramadol her pain along with his muscle relaxant and anti-inflammatory. Finally would like to have Dr. Alvester Morin evaluate him for a right L3-L4 likely translaminar injection. Given the radicular symptoms in his cervical spine we may end up needing to obtain an MRI of his neck but we will wait until seeing him back for the response from therapy as well as medications. All questions were encouraged and answered.  Follow-Up Instructions: Return in about 4 weeks (around 12/02/2016).   Orders:  Orders Placed This Encounter  Procedures  . XR Cervical Spine 2 or 3 views   Meds ordered this encounter  Medications  . tiZANidine (ZANAFLEX) 4 MG tablet    Sig: Take 1 tablet (4 mg total) by mouth every 8 (eight) hours as needed for muscle spasms.    Dispense:  60 tablet    Refill:  0    Please consider 90 day supplies to promote better adherence  . gabapentin (NEURONTIN) 300 MG capsule    Sig: Take 1 capsule (300 mg total) by mouth 2 (two) times daily.    Dispense:  90 capsule    Refill:  0    Please consider 90 day supplies to promote better adherence  . diclofenac (VOLTAREN) 75 MG EC tablet    Sig: Take 1 tablet (75 mg  total) by mouth 2 (two) times daily.    Dispense:  60 tablet    Refill:  3    Please consider 90 day supplies to promote better adherence  . traMADol (ULTRAM) 50 MG tablet    Sig: Take 2 tablets (100 mg total) by mouth 3 (three) times daily as needed.    Dispense:  60 tablet    Refill:  0      Procedures: No procedures performed   Clinical Data: No additional findings.   Subjective: No chief complaint on file. The patient is returning for follow-up after MRI of his lumbar spine due to significant radicular symptoms going down both his legs more the right than the left. Is been his backside as well as back pain in general. It does not radiate to his feet. He's worked and heavy manual labor through all this. He worked last night but feels like it needs to be off. He's also developed now severe neck pain is burning into his right shoulder is away describes it. His mortise In the back of the shoulder. He denies a weakness or numbness and tingling in his hands. He has been on Neurontin as well as Zanaflex and diclofenac. He  needs refills of all these. The Neurontin he just takes at bedtime. He is not getting good sleep either.  HPI  Review of Systems He still denies any change in bowel or bladder function. Denies any chest pain, headache, short of breath, fever, chills, nausea, vomiting.  Objective: Vital Signs: There were no vitals taken for this visit.  Physical Exam He is alert or 3 in no acute distress but obvious discomfort and he appears tired. Ortho Exam Examination of cervical spine shows almost full motion but is deathly stiff and painful more the right and left. He has some radicular component of this into the parascapular area but no weakness in either arm. Examination of lumbar spine she is pain in the facet joint areas of the lower lumbar spine does radiate into the sciatic region on both sides. Specialty Comments:  No specialty comments available.  Imaging: Xr  Cervical Spine 2 Or 3 Views  Result Date: 11/04/2016 An AP and lateral cervical spine shows severe degenerative disc disease at multiple levels.  The MRI of his lumbar spine shows multilevel's with degenerative disc disease and stenosis with disc protrusion at L3-L4 and states that her slight mass effect on the right L4 nerve root. He also has a soft disc extrusion L5-S1 but it is not appear that it has any nerve impingement. The L3-L4 disc is broad-based.  PMFS History: Patient Active Problem List   Diagnosis Date Noted  . Neck pain 11/04/2016  . Chronic bilateral low back pain with bilateral sciatica 11/04/2016   No past medical history on file.  No family history on file.  No past surgical history on file. Social History   Occupational History  . Not on file.   Social History Main Topics  . Smoking status: Current Every Day Smoker    Types: Cigarettes  . Smokeless tobacco: Never Used  . Alcohol use Yes  . Drug use: No  . Sexual activity: Not on file

## 2016-11-04 NOTE — Telephone Encounter (Signed)
Please advise 

## 2016-11-06 ENCOUNTER — Telehealth (INDEPENDENT_AMBULATORY_CARE_PROVIDER_SITE_OTHER): Payer: Self-pay | Admitting: Physical Medicine and Rehabilitation

## 2016-11-07 ENCOUNTER — Other Ambulatory Visit (INDEPENDENT_AMBULATORY_CARE_PROVIDER_SITE_OTHER): Payer: Self-pay

## 2016-11-07 DIAGNOSIS — G8929 Other chronic pain: Secondary | ICD-10-CM

## 2016-11-07 DIAGNOSIS — M5441 Lumbago with sciatica, right side: Principal | ICD-10-CM

## 2016-11-07 DIAGNOSIS — M5442 Lumbago with sciatica, left side: Principal | ICD-10-CM

## 2016-11-07 NOTE — Telephone Encounter (Signed)
I just put an order in, I am sorry

## 2016-11-11 ENCOUNTER — Telehealth (INDEPENDENT_AMBULATORY_CARE_PROVIDER_SITE_OTHER): Payer: Self-pay | Admitting: Orthopaedic Surgery

## 2016-11-11 NOTE — Telephone Encounter (Signed)
Patient would like to be referred to [name] [address]  CB#[phone]

## 2016-11-12 NOTE — Telephone Encounter (Signed)
Faxed Rx to [facility]

## 2016-11-17 ENCOUNTER — Telehealth (INDEPENDENT_AMBULATORY_CARE_PROVIDER_SITE_OTHER): Payer: Self-pay

## 2016-11-17 MED ORDER — TIZANIDINE HCL 4 MG PO TABS: 4.0000 mg | ORAL_TABLET | Freq: Three times a day (TID) | ORAL | 0 refills | Status: DC | PRN

## 2016-11-17 NOTE — Telephone Encounter (Signed)
Please advise 

## 2016-11-17 NOTE — Telephone Encounter (Signed)
Try Tylenol No. 3 one to 2 every 8 hours as needed for pain #60 with no refills. Let him know that this is probably requirement due to all these medications needing narcotics.

## 2016-11-17 NOTE — Telephone Encounter (Signed)
IC s/w patient and he actually wanted refill on zanaflex. I submitted refill. He did not want pain med refill.

## 2016-11-17 NOTE — Telephone Encounter (Signed)
Patient stated that insurance will only cover a 10 day supply of Tramadol.  Would like another Rx that would be covered by his insurance. Cb# is [phone].  Please advise.  Thank You.

## 2016-11-19 DIAGNOSIS — M79605 Pain in left leg: Secondary | ICD-10-CM | POA: Diagnosis not present

## 2016-11-19 DIAGNOSIS — M545 Low back pain: Secondary | ICD-10-CM | POA: Diagnosis not present

## 2016-11-19 DIAGNOSIS — M79604 Pain in right leg: Secondary | ICD-10-CM | POA: Diagnosis not present

## 2016-11-20 ENCOUNTER — Encounter (INDEPENDENT_AMBULATORY_CARE_PROVIDER_SITE_OTHER): Payer: BLUE CROSS/BLUE SHIELD | Admitting: Physical Medicine and Rehabilitation

## 2016-11-23 ENCOUNTER — Other Ambulatory Visit (INDEPENDENT_AMBULATORY_CARE_PROVIDER_SITE_OTHER): Payer: Self-pay | Admitting: Orthopaedic Surgery

## 2016-11-24 ENCOUNTER — Encounter (INDEPENDENT_AMBULATORY_CARE_PROVIDER_SITE_OTHER): Payer: Self-pay | Admitting: Physical Medicine and Rehabilitation

## 2016-11-24 ENCOUNTER — Ambulatory Visit (INDEPENDENT_AMBULATORY_CARE_PROVIDER_SITE_OTHER): Payer: BLUE CROSS/BLUE SHIELD

## 2016-11-24 ENCOUNTER — Ambulatory Visit (INDEPENDENT_AMBULATORY_CARE_PROVIDER_SITE_OTHER): Payer: BLUE CROSS/BLUE SHIELD | Admitting: Physical Medicine and Rehabilitation

## 2016-11-24 VITALS — BP 138/78 | HR 52 | Temp 97.8°F

## 2016-11-24 DIAGNOSIS — M5416 Radiculopathy, lumbar region: Secondary | ICD-10-CM

## 2016-11-24 MED ORDER — LIDOCAINE HCL (PF) 1 % IJ SOLN
2.0000 mL | Freq: Once | INTRAMUSCULAR | Status: AC
  Administered 2016-11-24: 2 mL

## 2016-11-24 MED ORDER — BETAMETHASONE SOD PHOS & ACET 6 (3-3) MG/ML IJ SUSP
12.0000 mg | Freq: Once | INTRAMUSCULAR | Status: AC
  Administered 2016-11-24: 12 mg

## 2016-11-24 NOTE — Telephone Encounter (Signed)
Please advise 

## 2016-11-24 NOTE — Procedures (Signed)
[name] is a 61 year old gentleman followed by Dr. Magnus Ivan in our office. He's had chronic worsening low back pain and bilateral a little bit more right than left with pain into the anterolateral thighs as well as buttock region and sometimes even past the knee and the calf. He has had a chronic history of off and on back pain but months and worsening symptoms with MRI evidence of disc extrusion at L3-4 with moderate stenosis as well as small disc extrusion at L5-S1 without compression. He has failed other conservative care. The injection  will be diagnostic and hopefully therapeutic. The patient has failed conservative care including time, medications and activity modification.  Lumbar Epidural Steroid Injection - Interlaminar Approach with Fluoroscopic Guidance  Patient: [name]      Date of Birth: [birth year] MRN: [id number] PCP: Thayer Headings, MD      Visit Date: 11/24/2016   Universal Protocol:     Consent Given By: the patient  Position: PRONE  Additional Comments: Vital signs were monitored before and after the procedure. Patient was prepped and draped in the usual sterile fashion. The correct patient, procedure, and site was verified.   Injection Procedure Details:  Procedure Site One Meds Administered:  Meds ordered this encounter  Medications  . lidocaine (PF) (XYLOCAINE) 1 % injection 2 mL  . betamethasone acetate-betamethasone sodium phosphate (CELESTONE) injection 12 mg     Laterality: Bilateral  Location/Site:  L4-L5  Needle size: 20 G  Needle type: Tuohy  Needle Placement: Paramedian epidural  Findings:  -Contrast Used: 1 mL iohexol 180 mg iodine/mL   -Comments: Excellent flow of contrast into the epidural space.  Procedure Details: Using a paramedian approach from the side mentioned above, the region overlying the inferior lamina was localized under fluoroscopic visualization and the soft tissues overlying this structure were infiltrated  with 4 ml. of 1% Lidocaine without Epinephrine. The Tuohy needle was inserted into the epidural space using a paramedian approach.   The epidural space was localized using loss of resistance along with lateral and bi-planar fluoroscopic views.  After negative aspirate for air, blood, and CSF, a 2 ml. volume of Isovue-250 was injected into the epidural space and the flow of contrast was observed. Radiographs were obtained for documentation purposes.    The injectate was administered into the level noted above.   Additional Comments:  The patient tolerated the procedure well Dressing: Band-Aid    Post-procedure details: Patient was observed during the procedure. Post-procedure instructions were reviewed.  Patient left the clinic in stable condition.

## 2016-11-24 NOTE — Progress Notes (Deleted)
Lower back pain. Numbness in both anterior thighs, buttock numbness. Hurts worse with standing.

## 2016-11-24 NOTE — Patient Instructions (Signed)

## 2016-11-26 ENCOUNTER — Ambulatory Visit (INDEPENDENT_AMBULATORY_CARE_PROVIDER_SITE_OTHER): Payer: BLUE CROSS/BLUE SHIELD | Admitting: Orthopaedic Surgery

## 2016-11-26 DIAGNOSIS — G8929 Other chronic pain: Secondary | ICD-10-CM | POA: Diagnosis not present

## 2016-11-26 DIAGNOSIS — M542 Cervicalgia: Secondary | ICD-10-CM

## 2016-11-26 DIAGNOSIS — M545 Low back pain: Secondary | ICD-10-CM | POA: Diagnosis not present

## 2016-11-26 DIAGNOSIS — M5442 Lumbago with sciatica, left side: Secondary | ICD-10-CM

## 2016-11-26 DIAGNOSIS — M79605 Pain in left leg: Secondary | ICD-10-CM | POA: Diagnosis not present

## 2016-11-26 DIAGNOSIS — M25511 Pain in right shoulder: Secondary | ICD-10-CM | POA: Diagnosis not present

## 2016-11-26 DIAGNOSIS — M79604 Pain in right leg: Secondary | ICD-10-CM | POA: Diagnosis not present

## 2016-11-26 DIAGNOSIS — M5441 Lumbago with sciatica, right side: Secondary | ICD-10-CM

## 2016-11-26 MED ORDER — METHYLPREDNISOLONE ACETATE 40 MG/ML IJ SUSP
40.0000 mg | INTRAMUSCULAR | Status: AC | PRN
  Administered 2016-11-26: 40 mg via INTRA_ARTICULAR

## 2016-11-26 MED ORDER — TRAMADOL HCL 50 MG PO TABS: 100.0000 mg | ORAL_TABLET | Freq: Two times a day (BID) | ORAL | 0 refills | Status: DC | PRN

## 2016-11-26 MED ORDER — LIDOCAINE HCL 1 % IJ SOLN
3.0000 mL | INTRAMUSCULAR | Status: AC | PRN
  Administered 2016-11-26: 3 mL

## 2016-11-26 NOTE — Progress Notes (Signed)
Office Visit Note   Patient: [name]           Date of Birth: [birth year]           MRN: [id number] Visit Date: 11/26/2016              Requested by: Thayer Headings, MD [address], [address] [place], [place] [address] PCP: Thayer Headings, MD   Assessment & Plan: Visit Diagnoses:  1. Chronic bilateral low back pain with bilateral sciatica   2. Neck pain   3. Chronic right shoulder pain     Plan: At this point we'll continue physical therapy on his lumbar spine but we'll add prescription for working on his cervical spine and shoulder with any modalities to decrease pain and improve his function. I did talk about steroid shot in the subdural space of his right shoulder The risks and benefits of this he did wish proceed with a shoulder injection which he tolerated well. I did refill his tramadol as well. We'll see him back in 4 weeks how is doing overall.  Follow-Up Instructions: Return in about 4 weeks (around 12/24/2016).   Orders:  No orders of the defined types were placed in this encounter.  No orders of the defined types were placed in this encounter.     Procedures: Large Joint Inj Date/Time: 11/26/2016 10:24 AM Performed by: Kathryne Hitch Authorized by: Kathryne Hitch   Location:  Shoulder Site:  R subacromial bursa Ultrasound Guidance: No   Fluoroscopic Guidance: No   Arthrogram: No   Medications:  3 mL lidocaine 1 %; 40 mg methylPREDNISolone acetate 40 MG/ML     Clinical Data: No additional findings.   Subjective: No chief complaint on file. The patient's well-known to me. Is been going to physical therapy to changes in lumbar spine. We have also treated right shoulder pain the past as well as neck pain. He has known significant arthritis in his neck but only bursitis and tendinitis of the shoulder. The shoulders flared up quite a bit today. These things are certainly calling him enough to keep him out of work. We tried  some tramadol for pain on occasion anti-inflammatories. He does say that physical therapy lumbar spine is helping significantly. His right shoulder pain is mainly activity-related when he reaches overhead and our way from. Hurts from the shoulder down to the elbow.  HPI  Review of Systems He currently denies any headache, chest pain, short of breath, fever, chills, nausea, vomiting.  Objective: Vital Signs: There were no vitals taken for this visit.  Physical Exam He is alert and oriented 3 and in no acute distress Ortho Exam Examination of his right shoulder shows fluid range of motion shoulder but positive Neer and Hawkins signs. His range of motion is neck is stiff but full and painful. He has no radicular symptoms in the upper extremities otherwise. His get good grip strength bilaterally. Specialty Comments:  No specialty comments available.  Imaging: No results found.   PMFS History: Patient Active Problem List   Diagnosis Date Noted  . Chronic right shoulder pain 11/26/2016  . Neck pain 11/04/2016  . Chronic bilateral low back pain with bilateral sciatica 11/04/2016   No past medical history on file.  No family history on file.  No past surgical history on file. Social History   Occupational History  . Not on file.   Social History Main Topics  . Smoking status: Current Every Day Smoker    Types:  Cigarettes  . Smokeless tobacco: Never Used  . Alcohol use Yes  . Drug use: No  . Sexual activity: Not on file

## 2016-12-02 ENCOUNTER — Ambulatory Visit (INDEPENDENT_AMBULATORY_CARE_PROVIDER_SITE_OTHER): Payer: BLUE CROSS/BLUE SHIELD | Admitting: Orthopaedic Surgery

## 2016-12-05 ENCOUNTER — Ambulatory Visit (HOSPITAL_COMMUNITY)
Admission: EM | Admit: 2016-12-05 | Discharge: 2016-12-05 | Disposition: A | Discharge status: Home / Self Care | Payer: BLUE CROSS/BLUE SHIELD | Attending: Internal Medicine | Admitting: Internal Medicine

## 2016-12-05 ENCOUNTER — Encounter (HOSPITAL_COMMUNITY): Payer: Self-pay | Admitting: Emergency Medicine

## 2016-12-05 DIAGNOSIS — L237 Allergic contact dermatitis due to plants, except food: Secondary | ICD-10-CM

## 2016-12-05 DIAGNOSIS — R21 Rash and other nonspecific skin eruption: Secondary | ICD-10-CM

## 2016-12-05 MED ORDER — METHYLPREDNISOLONE SODIUM SUCC 125 MG IJ SOLR
125.0000 mg | Freq: Once | INTRAMUSCULAR | Status: AC
  Administered 2016-12-05: 125 mg via INTRAMUSCULAR

## 2016-12-05 MED ORDER — METHYLPREDNISOLONE SODIUM SUCC 125 MG IJ SOLR
INTRAMUSCULAR | Status: AC
  Filled 2016-12-05: qty 2

## 2016-12-05 MED ORDER — BETAMETHASONE VALERATE 0.1 % EX OINT: 1.0000 "application " | TOPICAL_OINTMENT | Freq: Two times a day (BID) | CUTANEOUS | 0 refills | Status: DC

## 2016-12-05 NOTE — ED Provider Notes (Signed)
[facility]    CSN: [id number] Arrival date & time: 12/05/16  1448     History   Chief Complaint Chief Complaint  Patient presents with  . Poison Ivy    HPI [name] is a 61 y.o. male.   61 year old male comes in for one-week history of rash on bilateral forearms. He states he was outside tending the yard when he got in contact with poison ivy. He has been using Benadryl cream without relief. States rash continues to be extremely pruritic. Denies spreading erythema, increased warmth, pain, fever. Denies trouble breathing, trouble swallowing, swelling of the throat.      History reviewed. No pertinent past medical history.  Patient Active Problem List   Diagnosis Date Noted  . Chronic right shoulder pain 11/26/2016  . Neck pain 11/04/2016  . Chronic bilateral low back pain with bilateral sciatica 11/04/2016    History reviewed. No pertinent surgical history.     Home Medications    Prior to Admission medications   Medication Sig Start Date End Date Taking? Authorizing Provider  atorvastatin (LIPITOR) 20 MG tablet Take 20 mg by mouth daily.  04/23/16  Yes [provider]  diclofenac (VOLTAREN) 75 MG EC tablet TAKE 1 TABLET BY MOUTH TWICE DAILY 10/17/16  Yes Kathryne Hitch, MD  diclofenac (VOLTAREN) 75 MG EC tablet Take 1 tablet (75 mg total) by mouth 2 (two) times daily. 11/04/16  Yes Kathryne Hitch, MD  gabapentin (NEURONTIN) 300 MG capsule Take 1 capsule (300 mg total) by mouth 2 (two) times daily. 11/04/16  Yes Kathryne Hitch, MD  sertraline (ZOLOFT) 100 MG tablet Take 100 mg by mouth daily.  03/14/16  Yes [provider]  tiZANidine (ZANAFLEX) 4 MG tablet Take 1 tablet (4 mg total) by mouth every 8 (eight) hours as needed for muscle spasms. 11/17/16  Yes Kathryne Hitch, MD  traMADol (ULTRAM) 50 MG tablet Take 2 tablets (100 mg total) by mouth every 12 (twelve) hours as needed. 11/26/16  Yes Kathryne Hitch, MD  betamethasone valerate ointment (VALISONE) 0.1 % Apply 1 application topically 2 (two) times daily. 12/05/16   Cathie Hoops, Amy V, PA-C  methylPREDNISolone (MEDROL) 4 MG tablet Medrol dose pack. Take as instructed 10/08/16   Kathryne Hitch, MD  ondansetron (ZOFRAN ODT) 4 MG disintegrating tablet  ODT q4 hours prn nausea/vomit 06/28/16   Blane Ohara, MD    Family History History reviewed. No pertinent family history.  Social History Social History  Substance Use Topics  . Smoking status: Current Every Day Smoker    Types: Cigarettes  . Smokeless tobacco: Never Used  . Alcohol use Yes     Allergies   Patient has no known allergies.   Review of Systems Review of Systems  Reason unable to perform ROS: See HPI as above.     Physical Exam Triage Vital Signs ED Triage Vitals  Enc Vitals Group     BP 12/05/16 1549 124/68     Pulse Rate 12/05/16 1549 (!) 50     Resp 12/05/16 1549 18     Temp 12/05/16 1549 98.5 F (36.9 C)     Temp Source 12/05/16 1549 Oral     SpO2 12/05/16 1549 97 %     Weight --      Height --      Head Circumference --      Peak Flow --      Pain Score 12/05/16 1550 6  Pain Loc --      Pain Edu? --      Excl. in GC? --    No data found.   Updated Vital Signs BP 124/68 (BP Location: Left Arm)   Pulse (!) 50   Temp 98.5 F (36.9 C) (Oral)   Resp 18   SpO2 97%   Physical Exam  Constitutional: He is oriented to person, place, and time. He appears well-developed and well-nourished. No distress.  HENT:  Head: Normocephalic and atraumatic.  Eyes: Pupils are equal, round, and reactive to light. Conjunctivae are normal.  Pulmonary/Chest: Effort normal. No respiratory distress.  Neurological: He is alert and oriented to person, place, and time.  Skin:  Vesicular rash with surrounding erythema of bilateral forearms. No increased warmth. See pictures below.         UC Treatments / Results  Labs (all labs ordered are  listed, but only abnormal results are displayed) Labs Reviewed - No data to display  EKG  EKG Interpretation None       Radiology No results found.  Procedures Procedures (including critical care time)  Medications Ordered in UC Medications  methylPREDNISolone sodium succinate (SOLU-MEDROL) 125 mg/2 mL injection 125 mg (125 mg Intramuscular Given 12/05/16 1624)     Initial Impression / Assessment and Plan / UC Course  I have reviewed the triage vital signs and the nursing notes.  Pertinent labs & imaging results that were available during my care of the patient were reviewed by me and considered in my medical decision making (see chart for details).     History and exam consistent with poison ivy dermatitis. Patient would like IM steroids vs PO. Solu-Medrol injection in office. High potency topical corticosteroids prescribed. Patient to apply as directed on affected area. Over-the-counter antihistamines for pruritus, ice compress as needed. Return precautions given.  Final Clinical Impressions(s) / UC Diagnoses   Final diagnoses:  Poison ivy dermatitis    New Prescriptions Discharge Medication List as of 12/05/2016  4:16 PM    START taking these medications   Details  betamethasone valerate ointment (VALISONE) 0.1 % Apply 1 application topically 2 (two) times daily., Starting Fri 12/05/2016, Normal          Cathie Hoops, Amy V, PA-C 12/05/16 2134

## 2016-12-05 NOTE — Discharge Instructions (Signed)
Solu-Medrol injection in office today. You can apply Valisone ointment on the affected area to help with symptoms. Over-the-counter antihistamine such as Zyrtec, Allegra, for itching. You can also use ice compress to help with itchiness. Monitor for any worsening of symptoms, spreading redness, increased warmth, increased pain, fever, follow-up for reevaluation.

## 2016-12-05 NOTE — ED Triage Notes (Signed)
Pt here for poison ivy on bilateral arms onset 1 week  Has been applying OTC creams w/no relief.   A&O x4... NAD.... Ambulatory

## 2016-12-08 ENCOUNTER — Ambulatory Visit (INDEPENDENT_AMBULATORY_CARE_PROVIDER_SITE_OTHER): Payer: BLUE CROSS/BLUE SHIELD

## 2016-12-08 ENCOUNTER — Ambulatory Visit (INDEPENDENT_AMBULATORY_CARE_PROVIDER_SITE_OTHER): Payer: BLUE CROSS/BLUE SHIELD | Admitting: Physical Medicine and Rehabilitation

## 2016-12-08 ENCOUNTER — Encounter (INDEPENDENT_AMBULATORY_CARE_PROVIDER_SITE_OTHER): Payer: Self-pay | Admitting: Physical Medicine and Rehabilitation

## 2016-12-08 VITALS — BP 137/79 | HR 55 | Temp 98.4°F

## 2016-12-08 DIAGNOSIS — M5416 Radiculopathy, lumbar region: Secondary | ICD-10-CM | POA: Diagnosis not present

## 2016-12-08 DIAGNOSIS — M5116 Intervertebral disc disorders with radiculopathy, lumbar region: Secondary | ICD-10-CM | POA: Diagnosis not present

## 2016-12-08 DIAGNOSIS — M48062 Spinal stenosis, lumbar region with neurogenic claudication: Secondary | ICD-10-CM

## 2016-12-08 MED ORDER — BETAMETHASONE SOD PHOS & ACET 6 (3-3) MG/ML IJ SUSP
12.0000 mg | Freq: Once | INTRAMUSCULAR | Status: AC
  Administered 2016-12-08: 12 mg

## 2016-12-08 MED ORDER — LIDOCAINE HCL (PF) 1 % IJ SOLN
2.0000 mL | Freq: Once | INTRAMUSCULAR | Status: AC
  Administered 2016-12-08: 2 mL

## 2016-12-08 NOTE — Patient Instructions (Signed)

## 2016-12-08 NOTE — Procedures (Signed)
[name] is a 61 year old gentleman with low back and bilateral hip and leg pain for the anterior thighs and more of an L3 and L4 distribution. Prior L4-5 epidural injection gave him 40-50% relief. He continues to have symptoms that are pretty progressively worsening for him. We are going to repeat the injection at L3-4 and see how he does. He has herniated disc and moderate stenosis here. Depending on his relief I'll have him come back in a few weeks for possible transforaminal injection. He also is having poison ivy outbreak in his arms. He had recent general cortisone injection by his primary care physician. Hopefully the medicine today will help some of that as well. Lumbar Epidural Steroid Injection - Interlaminar Approach with Fluoroscopic Guidance  Patient: [name]      Date of Birth: 1955/07/17 MRN: [id number] PCP: Thayer Headings, MD      Visit Date: 12/08/2016   Universal Protocol:     Consent Given By: the patient  Position: PRONE  Additional Comments: Vital signs were monitored before and after the procedure. Patient was prepped and draped in the usual sterile fashion. The correct patient, procedure, and site was verified.   Injection Procedure Details:  Procedure Site One Meds Administered:  Meds ordered this encounter  Medications  . lidocaine (PF) (XYLOCAINE) 1 % injection 2 mL  . betamethasone acetate-betamethasone sodium phosphate (CELESTONE) injection 12 mg     Laterality: Bilateral  Location/Site:  L3-L4  Needle size: 20 G  Needle type: Tuohy  Needle Placement: Paramedian epidural  Findings:  -Contrast Used: 1 mL iohexol 180 mg iodine/mL   -Comments: Excellent flow of contrast into the epidural space.  Procedure Details: Using a paramedian approach from the side mentioned above, the region overlying the inferior lamina was localized under fluoroscopic visualization and the soft tissues overlying this structure were infiltrated with 4 ml. of  1% Lidocaine without Epinephrine. The Tuohy needle was inserted into the epidural space using a paramedian approach.   The epidural space was localized using loss of resistance along with lateral and bi-planar fluoroscopic views.  After negative aspirate for air, blood, and CSF, a 2 ml. volume of Isovue-250 was injected into the epidural space and the flow of contrast was observed. Radiographs were obtained for documentation purposes.    The injectate was administered into the level noted above.   Additional Comments:  The patient tolerated the procedure well Dressing: Band-Aid    Post-procedure details: Patient was observed during the procedure. Post-procedure instructions were reviewed.  Patient left the clinic in stable condition.

## 2016-12-08 NOTE — Progress Notes (Deleted)
Feels he got a little relief from last injection- maybe 30%. Still at about 30% with PT. Pain is still in same area when he has it.

## 2016-12-10 ENCOUNTER — Other Ambulatory Visit (INDEPENDENT_AMBULATORY_CARE_PROVIDER_SITE_OTHER): Payer: Self-pay | Admitting: Orthopaedic Surgery

## 2016-12-11 NOTE — Telephone Encounter (Signed)
Please advise 

## 2016-12-16 ENCOUNTER — Telehealth (INDEPENDENT_AMBULATORY_CARE_PROVIDER_SITE_OTHER): Payer: Self-pay | Admitting: Orthopaedic Surgery

## 2016-12-16 NOTE — Telephone Encounter (Signed)
Patient's wife called stating that she called in a refill to the pharmacy for her husband on his Tizanidine.  Pharmacy has not received a request to refill back from [place].  CB#[phone].  Thank you.

## 2016-12-16 NOTE — Telephone Encounter (Signed)
Ok to refill Zanaflex 4 mg, 1 po tid prn spasms, #60

## 2016-12-16 NOTE — Telephone Encounter (Signed)
Please advise 

## 2016-12-17 ENCOUNTER — Other Ambulatory Visit (INDEPENDENT_AMBULATORY_CARE_PROVIDER_SITE_OTHER): Payer: Self-pay

## 2016-12-17 MED ORDER — TIZANIDINE HCL 4 MG PO TABS: 4.0000 mg | ORAL_TABLET | Freq: Three times a day (TID) | ORAL | 0 refills | Status: DC | PRN

## 2016-12-17 NOTE — Telephone Encounter (Signed)
Called into pharmacy

## 2016-12-24 ENCOUNTER — Ambulatory Visit (INDEPENDENT_AMBULATORY_CARE_PROVIDER_SITE_OTHER): Payer: BLUE CROSS/BLUE SHIELD | Admitting: Orthopaedic Surgery

## 2016-12-24 DIAGNOSIS — G8929 Other chronic pain: Secondary | ICD-10-CM | POA: Diagnosis not present

## 2016-12-24 DIAGNOSIS — M5442 Lumbago with sciatica, left side: Secondary | ICD-10-CM

## 2016-12-24 DIAGNOSIS — M7541 Impingement syndrome of right shoulder: Secondary | ICD-10-CM

## 2016-12-24 DIAGNOSIS — M542 Cervicalgia: Secondary | ICD-10-CM

## 2016-12-24 DIAGNOSIS — M5441 Lumbago with sciatica, right side: Secondary | ICD-10-CM

## 2016-12-24 NOTE — Progress Notes (Signed)
[name] is following up after bilateral L3-L4 injections by Dr. Alvester MorinNewton as well as a right shoulder subacromial injection by me. He says he is doing much better overall it feels good. He is back at work as well. He says the muscle relaxant helps. He had to hold off on physical therapy because he had severe breakout of poison ivy. He is doing well overall. He still having some neck pain and feels like his head is "heavy on his shoulders".  On exam his exam is basically normal today with his neck shoulder and legs. His back pain is minimal.  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 I've encouraged him to try to get back into physical therapy as well. Apparently does have another intervention with Dr. Alvester MorinNewton next week and these apparently are helping him significantly at this point. I'll see him back myself in 6 weeks' 0 is doing overall.

## 2016-12-26 ENCOUNTER — Other Ambulatory Visit (INDEPENDENT_AMBULATORY_CARE_PROVIDER_SITE_OTHER): Payer: Self-pay | Admitting: Family

## 2016-12-26 ENCOUNTER — Telehealth (INDEPENDENT_AMBULATORY_CARE_PROVIDER_SITE_OTHER): Payer: Self-pay | Admitting: Orthopaedic Surgery

## 2016-12-26 MED ORDER — TRAMADOL HCL 50 MG PO TABS: 100.0000 mg | ORAL_TABLET | Freq: Two times a day (BID) | ORAL | 0 refills | Status: DC | PRN

## 2016-12-26 NOTE — Telephone Encounter (Signed)
I called left vm on pharmacy line for rx refill for tramadol. I called patient to make him aware

## 2016-12-26 NOTE — Telephone Encounter (Signed)
I called left vm on pharmacy line for rx refill for tramadol. I called patient to make him aware 

## 2016-12-26 NOTE — Telephone Encounter (Signed)
Rx Tramadol [facility] @ [address]  Can you please advise for Dr Magnus IvanBlackman

## 2016-12-29 ENCOUNTER — Encounter (INDEPENDENT_AMBULATORY_CARE_PROVIDER_SITE_OTHER): Payer: Self-pay | Admitting: Physical Medicine and Rehabilitation

## 2016-12-29 ENCOUNTER — Ambulatory Visit (INDEPENDENT_AMBULATORY_CARE_PROVIDER_SITE_OTHER): Payer: BLUE CROSS/BLUE SHIELD | Admitting: Physical Medicine and Rehabilitation

## 2016-12-29 ENCOUNTER — Ambulatory Visit (INDEPENDENT_AMBULATORY_CARE_PROVIDER_SITE_OTHER): Payer: BLUE CROSS/BLUE SHIELD

## 2016-12-29 VITALS — BP 141/86 | HR 50

## 2016-12-29 DIAGNOSIS — M5416 Radiculopathy, lumbar region: Secondary | ICD-10-CM | POA: Diagnosis not present

## 2016-12-29 DIAGNOSIS — M48062 Spinal stenosis, lumbar region with neurogenic claudication: Secondary | ICD-10-CM

## 2016-12-29 DIAGNOSIS — M5116 Intervertebral disc disorders with radiculopathy, lumbar region: Secondary | ICD-10-CM

## 2016-12-29 MED ORDER — LIDOCAINE HCL (PF) 1 % IJ SOLN
2.0000 mL | Freq: Once | INTRAMUSCULAR | Status: AC
  Administered 2016-12-29: 2 mL

## 2016-12-29 MED ORDER — BETAMETHASONE SOD PHOS & ACET 6 (3-3) MG/ML IJ SUSP
12.0000 mg | Freq: Once | INTRAMUSCULAR | Status: AC
  Administered 2016-12-29: 12 mg

## 2016-12-29 NOTE — Progress Notes (Deleted)
Patient stated his pain is about a 7. And that he has been having a lot more pain lately in his Spine area

## 2016-12-29 NOTE — Patient Instructions (Signed)

## 2016-12-30 NOTE — Procedures (Signed)
[name] is a 61 year old gentleman whom we've completed to interlaminar epidural steroid injections and he's overall least 50% better in terms of what we first saw him. He still has this nagging pain at the left lower lumbar area at about L4-L5. MRI evidence mostly shows disc herniation or on the right than the left. This is at 2 locations and obviously has the moderate stenosis at L2-S3 and L3 S4. I think a transforaminal S1 transforaminal injection be the next step to try to get rid of some of this nagging lumbosacral pain that he is having. If he doesn't get to the point where he is comfortable the amount of pain that we can look at regrouping with physical therapy or return to referring physician or possibly O think it's necessary at this point spine surgery referral.  S1 Lumbosacral Transforaminal Epidural Steroid Injection - Sub-Pedicular Approach with Fluoroscopic Guidance   Patient: [name]      Date of Birth: 1955/08/16 MRN: [id number] PCP: Thayer HeadingsMackenzie, Brian, MD      Visit Date: 12/29/2016   Universal Protocol:    Date/Time: 10/23/185:18 AM  Consent Given By: the patient  Position:  PRONE  Additional Comments: Vital signs were monitored before and after the procedure. Patient was prepped and draped in the usual sterile fashion. The correct patient, procedure, and site was verified.   Injection Procedure Details:  Procedure Site One Meds Administered:  Meds ordered this encounter  Medications  . lidocaine (PF) (XYLOCAINE) 1 % injection 2 mL  . betamethasone acetate-betamethasone sodium phosphate (CELESTONE) injection 12 mg    Laterality: Left  Location/Site:  S1 Foramen   Needle size: 22 G  Needle type: Spinal  Needle Placement: Transforaminal  Findings:  -Contrast Used: 1 mL iohexol 180 mg iodine/mL   -Comments: Excellent flow of contrast along the nerve and into the epidural space.  Procedure Details: After squaring off the sacral end-plate to get a  true AP view, the C-arm was positioned so that the best possible view of the S1 foramen was visualized. The soft tissues overlying this structure were infiltrated with 2-3 ml. of 1% Lidocaine without Epinephrine.    The spinal needle was inserted toward the target using a "trajectory" view along the fluoroscope beam.  Under AP and lateral visualization, the needle was advanced so it did not puncture dura. Biplanar projections were used to confirm position. Aspiration was confirmed to be negative for CSF and/or blood. A 1-2 ml. volume of Isovue-250 was injected and flow of contrast was noted at each level. Radiographs were obtained for documentation purposes.   After attaining the desired flow of contrast documented above, a 0.5 to 1.0 ml test dose of 0.25% Marcaine was injected into each respective transforaminal space.  The patient was observed for 90 seconds post injection.  After no sensory deficits were reported, and normal lower extremity motor function was noted,   the above injectate was administered so that equal amounts of the injectate were placed at each foramen (level) into the transforaminal epidural space.   Additional Comments:  The patient tolerated the procedure well Dressing: Band-Aid    Post-procedure details: Patient was observed during the procedure. Post-procedure instructions were reviewed.  Patient left the clinic in stable condition.

## 2017-01-03 ENCOUNTER — Other Ambulatory Visit (INDEPENDENT_AMBULATORY_CARE_PROVIDER_SITE_OTHER): Payer: Self-pay | Admitting: Family

## 2017-01-03 ENCOUNTER — Other Ambulatory Visit (INDEPENDENT_AMBULATORY_CARE_PROVIDER_SITE_OTHER): Payer: Self-pay | Admitting: Orthopaedic Surgery

## 2017-01-05 NOTE — Telephone Encounter (Signed)
Please advise 

## 2017-01-05 NOTE — Telephone Encounter (Signed)
Called into pharmacy

## 2017-01-16 DIAGNOSIS — E559 Vitamin D deficiency, unspecified: Secondary | ICD-10-CM | POA: Diagnosis not present

## 2017-01-16 DIAGNOSIS — E785 Hyperlipidemia, unspecified: Secondary | ICD-10-CM | POA: Diagnosis not present

## 2017-01-16 DIAGNOSIS — R739 Hyperglycemia, unspecified: Secondary | ICD-10-CM | POA: Diagnosis not present

## 2017-01-17 ENCOUNTER — Other Ambulatory Visit (INDEPENDENT_AMBULATORY_CARE_PROVIDER_SITE_OTHER): Payer: Self-pay | Admitting: Orthopaedic Surgery

## 2017-01-19 NOTE — Telephone Encounter (Signed)
Please advise 

## 2017-01-21 DIAGNOSIS — R05 Cough: Secondary | ICD-10-CM | POA: Diagnosis not present

## 2017-01-21 DIAGNOSIS — E785 Hyperlipidemia, unspecified: Secondary | ICD-10-CM | POA: Diagnosis not present

## 2017-01-21 DIAGNOSIS — R739 Hyperglycemia, unspecified: Secondary | ICD-10-CM | POA: Diagnosis not present

## 2017-01-21 DIAGNOSIS — F339 Major depressive disorder, recurrent, unspecified: Secondary | ICD-10-CM | POA: Diagnosis not present

## 2017-01-27 ENCOUNTER — Other Ambulatory Visit (INDEPENDENT_AMBULATORY_CARE_PROVIDER_SITE_OTHER): Payer: Self-pay | Admitting: Orthopaedic Surgery

## 2017-01-27 NOTE — Telephone Encounter (Signed)
Please advise 

## 2017-02-09 ENCOUNTER — Ambulatory Visit (INDEPENDENT_AMBULATORY_CARE_PROVIDER_SITE_OTHER): Payer: BLUE CROSS/BLUE SHIELD | Admitting: Orthopaedic Surgery

## 2017-02-17 ENCOUNTER — Ambulatory Visit (INDEPENDENT_AMBULATORY_CARE_PROVIDER_SITE_OTHER): Payer: BLUE CROSS/BLUE SHIELD | Admitting: Orthopaedic Surgery

## 2017-02-20 ENCOUNTER — Other Ambulatory Visit (INDEPENDENT_AMBULATORY_CARE_PROVIDER_SITE_OTHER): Payer: Self-pay | Admitting: Orthopaedic Surgery

## 2017-02-20 NOTE — Telephone Encounter (Signed)
Please advise 

## 2017-03-01 ENCOUNTER — Other Ambulatory Visit (INDEPENDENT_AMBULATORY_CARE_PROVIDER_SITE_OTHER): Payer: Self-pay | Admitting: Orthopaedic Surgery

## 2017-03-04 NOTE — Telephone Encounter (Signed)
Called to pharmacy 

## 2017-03-04 NOTE — Telephone Encounter (Signed)
Ok for refill? 

## 2017-03-16 ENCOUNTER — Other Ambulatory Visit (INDEPENDENT_AMBULATORY_CARE_PROVIDER_SITE_OTHER): Payer: Self-pay | Admitting: Orthopaedic Surgery

## 2017-03-16 NOTE — Telephone Encounter (Signed)
Please advise 

## 2017-04-02 ENCOUNTER — Other Ambulatory Visit (INDEPENDENT_AMBULATORY_CARE_PROVIDER_SITE_OTHER): Payer: Self-pay | Admitting: Orthopaedic Surgery

## 2017-04-02 NOTE — Telephone Encounter (Signed)
Please advise 

## 2017-04-14 ENCOUNTER — Other Ambulatory Visit (INDEPENDENT_AMBULATORY_CARE_PROVIDER_SITE_OTHER): Payer: Self-pay | Admitting: Orthopaedic Surgery

## 2017-04-14 NOTE — Telephone Encounter (Signed)
Please advise 

## 2017-05-19 ENCOUNTER — Other Ambulatory Visit (INDEPENDENT_AMBULATORY_CARE_PROVIDER_SITE_OTHER): Payer: Self-pay | Admitting: Orthopaedic Surgery

## 2017-05-19 NOTE — Telephone Encounter (Signed)
Please advise 

## 2017-06-08 ENCOUNTER — Other Ambulatory Visit (INDEPENDENT_AMBULATORY_CARE_PROVIDER_SITE_OTHER): Payer: Self-pay | Admitting: Orthopaedic Surgery

## 2017-06-08 NOTE — Telephone Encounter (Signed)
Please advise 

## 2017-06-17 ENCOUNTER — Other Ambulatory Visit (INDEPENDENT_AMBULATORY_CARE_PROVIDER_SITE_OTHER): Payer: Self-pay | Admitting: Orthopaedic Surgery

## 2017-07-14 ENCOUNTER — Other Ambulatory Visit (INDEPENDENT_AMBULATORY_CARE_PROVIDER_SITE_OTHER): Payer: Self-pay | Admitting: Orthopaedic Surgery

## 2017-07-14 NOTE — Telephone Encounter (Signed)
Please advise 

## 2017-08-06 ENCOUNTER — Other Ambulatory Visit (INDEPENDENT_AMBULATORY_CARE_PROVIDER_SITE_OTHER): Payer: Self-pay | Admitting: Orthopaedic Surgery

## 2017-08-07 NOTE — Telephone Encounter (Signed)
Please advise 

## 2017-08-12 ENCOUNTER — Other Ambulatory Visit (INDEPENDENT_AMBULATORY_CARE_PROVIDER_SITE_OTHER): Payer: Self-pay | Admitting: Orthopaedic Surgery

## 2017-08-12 NOTE — Telephone Encounter (Signed)
Rx request 

## 2017-08-20 DIAGNOSIS — Z125 Encounter for screening for malignant neoplasm of prostate: Secondary | ICD-10-CM | POA: Diagnosis not present

## 2017-08-20 DIAGNOSIS — R739 Hyperglycemia, unspecified: Secondary | ICD-10-CM | POA: Diagnosis not present

## 2017-08-20 DIAGNOSIS — E785 Hyperlipidemia, unspecified: Secondary | ICD-10-CM | POA: Diagnosis not present

## 2017-09-02 DIAGNOSIS — Z9081 Acquired absence of spleen: Secondary | ICD-10-CM | POA: Diagnosis not present

## 2017-09-02 DIAGNOSIS — M1990 Unspecified osteoarthritis, unspecified site: Secondary | ICD-10-CM | POA: Diagnosis not present

## 2017-09-02 DIAGNOSIS — Z0000 Encounter for general adult medical examination without abnormal findings: Secondary | ICD-10-CM | POA: Diagnosis not present

## 2017-09-02 DIAGNOSIS — F339 Major depressive disorder, recurrent, unspecified: Secondary | ICD-10-CM | POA: Diagnosis not present

## 2017-09-02 DIAGNOSIS — Z23 Encounter for immunization: Secondary | ICD-10-CM | POA: Diagnosis not present

## 2017-09-02 DIAGNOSIS — E785 Hyperlipidemia, unspecified: Secondary | ICD-10-CM | POA: Diagnosis not present

## 2017-09-08 ENCOUNTER — Other Ambulatory Visit (INDEPENDENT_AMBULATORY_CARE_PROVIDER_SITE_OTHER): Payer: Self-pay | Admitting: Orthopaedic Surgery

## 2017-09-08 NOTE — Telephone Encounter (Signed)
Please advise 

## 2017-09-21 ENCOUNTER — Encounter (INDEPENDENT_AMBULATORY_CARE_PROVIDER_SITE_OTHER): Payer: Self-pay | Admitting: Physician Assistant

## 2017-09-21 ENCOUNTER — Ambulatory Visit (INDEPENDENT_AMBULATORY_CARE_PROVIDER_SITE_OTHER): Payer: BLUE CROSS/BLUE SHIELD | Admitting: Physician Assistant

## 2017-09-21 ENCOUNTER — Ambulatory Visit (INDEPENDENT_AMBULATORY_CARE_PROVIDER_SITE_OTHER): Payer: BLUE CROSS/BLUE SHIELD

## 2017-09-21 DIAGNOSIS — M79641 Pain in right hand: Secondary | ICD-10-CM

## 2017-09-21 MED ORDER — DICLOFENAC SODIUM 1 % TD GEL: 2.0000 g | Freq: Four times a day (QID) | TRANSDERMAL | 2 refills | Status: DC

## 2017-09-21 NOTE — Progress Notes (Signed)
Office Visit Note   Patient: [name]           Date of Birth: November 03, 1955           MRN: [id number] Visit Date: 09/21/2017              Requested by: Thayer HeadingsMackenzie, Brian, MD [address], [address] [place], [place] [id number] PCP: Thayer HeadingsMackenzie, Brian, MD   Assessment & Plan: Visit Diagnoses:  1. Pain in right hand     Plan: Discussed the x-ray findings with him.  Recommend Voltaren gel 2 g up to 4 times daily to the long finger metacarpal phalangeal joint.  Otherwise activities as tolerated.  Follow-up PRN  Follow-Up Instructions: Return if symptoms worsen or fail to improve.   Orders:  Orders Placed This Encounter  Procedures  . XR Hand Complete Right   Meds ordered this encounter  Medications  . diclofenac sodium (VOLTAREN) 1 % GEL    Sig: Apply 2 g topically 4 (four) times daily.    Dispense:  1 Tube    Refill:  2      Procedures: No procedures performed   Clinical Data: No additional findings.   Subjective: Chief Complaint  Patient presents with  . Right Hand - Pain    HPI [name] comes in today due to right hand pain.  He has had no known injury.  Pain is been on past few months.  Feels like a tightness.  He works as a Curatormechanic 8 hours a day.  He is tried Tylenol ibuprofen tramadol.  No numbness tingling throughout the hand.   Review of Systems See HPI otherwise negative  Objective: Vital Signs: There were no vitals taken for this visit.  Physical Exam  Constitutional: He is oriented to person, place, and time. He appears well-developed and well-nourished. No distress.  Pulmonary/Chest: Effort normal.  Neurological: He is alert and oriented to person, place, and time.  Skin: He is not diaphoretic.  Psychiatric: He has a normal mood and affect.    Ortho Exam Bilateral hands no rashes skin lesions ulcerations ecchymosis.  He does have oil stained hands from his work as a Curatormechanic.  There is no significant edema throughout either hand.  No  erythema.  Full motor full sensation bilateral hands radial pulses are intact.  He has full range of motion particularly of the right long finger through the metacarpal phalangeal joint the PIP joint and the DIP joint.  No triggering any fingers on the right hand.  Tenderness over the metacarpal phalangeal joint of the long finger right hand only. Specialty Comments:  No specialty comments available.  Imaging: Xr Hand Complete Right  Result Date: 09/21/2017 3 views right hand: No acute fracture.  There is narrowing at the long finger metacarpal phalangeal joint.  No significant spurring.  No other bony abnormalities.  Fingers well located particularly right long finger.    PMFS History: Patient Active Problem List   Diagnosis Date Noted  . Impingement syndrome of right shoulder 12/24/2016  . Chronic right shoulder pain 11/26/2016  . Neck pain 11/04/2016  . Chronic bilateral low back pain with bilateral sciatica 11/04/2016   History reviewed. No pertinent past medical history.  History reviewed. No pertinent family history.  History reviewed. No pertinent surgical history. Social History   Occupational History  . Not on file  Tobacco Use  . Smoking status: Current Every Day Smoker    Types: Cigarettes  . Smokeless tobacco: Never Used  Substance  and Sexual Activity  . Alcohol use: Yes  . Drug use: No  . Sexual activity: Not on file

## 2017-09-22 ENCOUNTER — Other Ambulatory Visit (INDEPENDENT_AMBULATORY_CARE_PROVIDER_SITE_OTHER): Payer: Self-pay | Admitting: Orthopaedic Surgery

## 2017-09-22 NOTE — Telephone Encounter (Signed)
Ok to rf? 

## 2017-09-24 ENCOUNTER — Telehealth (INDEPENDENT_AMBULATORY_CARE_PROVIDER_SITE_OTHER): Payer: Self-pay | Admitting: Physician Assistant

## 2017-09-24 NOTE — Telephone Encounter (Signed)
Please advise 

## 2017-09-24 NOTE — Telephone Encounter (Signed)
Patients wife called to ask a couple of questions about her husbands appt with Bronson CurbGil, since a cortisone inj couldn't be given in his right hand due to location she is wondering if cortisone tablets by mouth would help? Also if he thinks it could be rheumatoid arthritis and if he needs a referral to rheumatology?  Please advise # [phone]

## 2017-09-28 NOTE — Telephone Encounter (Signed)
Can you please call 

## 2017-09-28 NOTE — Telephone Encounter (Signed)
No it looks like OA.  Medrol dose pak is ok to do. Medrol 4mg  #21 take as directed.

## 2017-09-29 ENCOUNTER — Other Ambulatory Visit (INDEPENDENT_AMBULATORY_CARE_PROVIDER_SITE_OTHER): Payer: Self-pay

## 2017-09-29 MED ORDER — METHYLPREDNISOLONE 4 MG PO TABS: ORAL_TABLET | ORAL | 0 refills | Status: DC

## 2017-09-29 NOTE — Telephone Encounter (Signed)
Patient wife aware of the below message from [place]

## 2017-10-01 ENCOUNTER — Encounter (INDEPENDENT_AMBULATORY_CARE_PROVIDER_SITE_OTHER): Payer: Self-pay | Admitting: Physician Assistant

## 2017-10-01 ENCOUNTER — Ambulatory Visit (INDEPENDENT_AMBULATORY_CARE_PROVIDER_SITE_OTHER): Payer: BLUE CROSS/BLUE SHIELD

## 2017-10-01 ENCOUNTER — Ambulatory Visit (INDEPENDENT_AMBULATORY_CARE_PROVIDER_SITE_OTHER): Payer: BLUE CROSS/BLUE SHIELD | Admitting: Physician Assistant

## 2017-10-01 DIAGNOSIS — M545 Low back pain: Secondary | ICD-10-CM

## 2017-10-01 MED ORDER — METHOCARBAMOL 500 MG PO TABS: 500.0000 mg | ORAL_TABLET | Freq: Three times a day (TID) | ORAL | 1 refills | Status: DC

## 2017-10-01 NOTE — Addendum Note (Signed)
Addended by: Richardean CanalLARK, GILBERT on: 10/01/2017 05:51 PM   Modules accepted: Level of Service

## 2017-10-01 NOTE — Progress Notes (Addendum)
Office Visit Note   Patient: [name]           Date of Birth: 12/31/1955           MRN: [id number] Visit Date: 10/01/2017              Requested by: Thayer Headings, MD [address], [address] [place], [place] [address] PCP: Thayer Headings, MD   Assessment & Plan: Visit Diagnoses:  1. Right low back pain, unspecified chronicity, with sciatica presence unspecified     Plan: We will send him to formal physical therapy for core strengthening, home exercise program, back exercises, modalities and lifting techniques.  Follow-up with [place] in 2 weeks check his progress lack of.  He will take the Medrol Dosepak that was called in for an few days ago.  We will stop his Zanaflex and start Robaxin.  Moist heat to his low back.  Follow-Up Instructions: Return in about 2 weeks (around 10/15/2017).   Orders:  Orders Placed This Encounter  Procedures  . XR Lumbar Spine 2-3 Views  . Ambulatory referral to Physical Therapy   Meds ordered this encounter  Medications  . methocarbamol (ROBAXIN) 500 MG tablet    Sig: Take 1 tablet (500 mg total) by mouth 3 (three) times daily.    Dispense:  40 tablet    Refill:  1      Procedures: No procedures performed   Clinical Data: No additional findings.   Subjective: Chief Complaint  Patient presents with  . Lower Back - Pain    HPI Mr. Akhavan comes in today with low back pain with radicular symptoms down into his right lateral thigh.  Started while he was moving jacks on and off a trailer at work on 09/29/2017.  Tylene Fantasia says weight 100- 200 pounds.  He has been seen in the past due to low back pain and had prior epidural steroid injections by Dr. Alvester Morin.  MRI in 2018 showed disc extrusion at L3-4 with moderate stenosis as well as a small disc extrusion at L5-S1 without compression.  He done well until recently.  He denies any bowel or bladder dysfunction.  Does have some waking pain due to low back.  Overall his back pain is  improving.  He is tried some Tylenol Extra Strength and Zanaflex.  He was given a Medrol Dosepak due to some hand pain which he has not started as of yet. Review of Systems Denies any fevers, chills, dysuria, chest pain or shortness of breath.  Objective: Vital Signs: There were no vitals taken for this visit.  Physical Exam  Constitutional: He is oriented to person, place, and time. He appears well-developed and well-nourished. No distress.  Cardiovascular: Intact distal pulses.  Pulmonary/Chest: Effort normal.  Neurological: He is alert and oriented to person, place, and time.  Skin: He is not diaphoretic.  Psychiatric: He has a normal mood and affect.    Ortho Exam Bilateral feet he has good sensation to light touch except for subjective decreased sensation over the medial aspect of the superficial peroneal nerve region of the right foot.  Deep tendon reflexes show slight decreased reflex response on the right compared to the left at the patella but bilateral ankle reflexes are 1+ and equal and symmetric.  He has negative straight leg raise bilaterally.  Good range of motion of bilateral hips without pain.  Has tenderness in the lower right paraspinous region with palpation.  He has limited extension of the lumbar spine.  He comes within 6 inches of touching his toes. Specialty Comments:  No specialty comments available.  Imaging: Xr Lumbar Spine 2-3 Views  Result Date: 10/01/2017 2 views of the lumbar spine compared to films in August 2018 show no acute fractures no significant change.  Multilevel spondylosis.  Retrolisthesis at L2-3 remains unchanged.    PMFS History: Patient Active Problem List   Diagnosis Date Noted  . Impingement syndrome of right shoulder 12/24/2016  . Chronic right shoulder pain 11/26/2016  . Neck pain 11/04/2016  . Chronic bilateral low back pain with bilateral sciatica 11/04/2016   History reviewed. No pertinent past medical history.  History  reviewed. No pertinent family history.  History reviewed. No pertinent surgical history. Social History   Occupational History  . Not on file  Tobacco Use  . Smoking status: Current Every Day Smoker    Types: Cigarettes  . Smokeless tobacco: Never Used  Substance and Sexual Activity  . Alcohol use: Yes  . Drug use: No  . Sexual activity: Not on file

## 2017-10-15 ENCOUNTER — Ambulatory Visit (INDEPENDENT_AMBULATORY_CARE_PROVIDER_SITE_OTHER): Payer: BLUE CROSS/BLUE SHIELD | Admitting: Physician Assistant

## 2017-10-19 ENCOUNTER — Other Ambulatory Visit (INDEPENDENT_AMBULATORY_CARE_PROVIDER_SITE_OTHER): Payer: Self-pay | Admitting: Orthopaedic Surgery

## 2017-10-19 NOTE — Telephone Encounter (Signed)
Please advise 

## 2017-11-12 DIAGNOSIS — Z23 Encounter for immunization: Secondary | ICD-10-CM | POA: Diagnosis not present

## 2017-11-19 ENCOUNTER — Other Ambulatory Visit (INDEPENDENT_AMBULATORY_CARE_PROVIDER_SITE_OTHER): Payer: Self-pay | Admitting: Physician Assistant

## 2017-11-19 NOTE — Telephone Encounter (Signed)
Please advise 

## 2017-11-21 ENCOUNTER — Other Ambulatory Visit (INDEPENDENT_AMBULATORY_CARE_PROVIDER_SITE_OTHER): Payer: Self-pay | Admitting: Physician Assistant

## 2017-11-23 NOTE — Telephone Encounter (Signed)
Please advise 

## 2017-12-23 ENCOUNTER — Ambulatory Visit (INDEPENDENT_AMBULATORY_CARE_PROVIDER_SITE_OTHER): Payer: BLUE CROSS/BLUE SHIELD | Admitting: Orthopaedic Surgery

## 2017-12-28 ENCOUNTER — Ambulatory Visit (INDEPENDENT_AMBULATORY_CARE_PROVIDER_SITE_OTHER): Payer: BLUE CROSS/BLUE SHIELD | Admitting: Orthopaedic Surgery

## 2017-12-29 ENCOUNTER — Other Ambulatory Visit (INDEPENDENT_AMBULATORY_CARE_PROVIDER_SITE_OTHER): Payer: Self-pay | Admitting: Physician Assistant

## 2017-12-29 NOTE — Telephone Encounter (Signed)
Please refuse

## 2018-01-28 ENCOUNTER — Other Ambulatory Visit (INDEPENDENT_AMBULATORY_CARE_PROVIDER_SITE_OTHER): Payer: Self-pay | Admitting: Physician Assistant

## 2018-01-29 NOTE — Telephone Encounter (Signed)
Please advise 

## 2018-02-17 DIAGNOSIS — E785 Hyperlipidemia, unspecified: Secondary | ICD-10-CM | POA: Diagnosis not present

## 2018-02-17 DIAGNOSIS — E559 Vitamin D deficiency, unspecified: Secondary | ICD-10-CM | POA: Diagnosis not present

## 2018-02-17 DIAGNOSIS — R739 Hyperglycemia, unspecified: Secondary | ICD-10-CM | POA: Diagnosis not present

## 2018-02-24 DIAGNOSIS — M1990 Unspecified osteoarthritis, unspecified site: Secondary | ICD-10-CM | POA: Diagnosis not present

## 2018-02-24 DIAGNOSIS — F17210 Nicotine dependence, cigarettes, uncomplicated: Secondary | ICD-10-CM | POA: Diagnosis not present

## 2018-02-24 DIAGNOSIS — E785 Hyperlipidemia, unspecified: Secondary | ICD-10-CM | POA: Diagnosis not present

## 2018-02-24 DIAGNOSIS — R7309 Other abnormal glucose: Secondary | ICD-10-CM | POA: Diagnosis not present

## 2018-02-27 ENCOUNTER — Other Ambulatory Visit (INDEPENDENT_AMBULATORY_CARE_PROVIDER_SITE_OTHER): Payer: Self-pay | Admitting: Orthopaedic Surgery

## 2018-03-01 NOTE — Telephone Encounter (Signed)
Please advise 

## 2018-03-23 ENCOUNTER — Other Ambulatory Visit (INDEPENDENT_AMBULATORY_CARE_PROVIDER_SITE_OTHER): Payer: Self-pay | Admitting: Orthopaedic Surgery

## 2018-03-24 ENCOUNTER — Ambulatory Visit (INDEPENDENT_AMBULATORY_CARE_PROVIDER_SITE_OTHER): Payer: BLUE CROSS/BLUE SHIELD | Admitting: Orthopaedic Surgery

## 2018-03-24 ENCOUNTER — Encounter (INDEPENDENT_AMBULATORY_CARE_PROVIDER_SITE_OTHER): Payer: Self-pay | Admitting: Orthopaedic Surgery

## 2018-03-24 DIAGNOSIS — G8929 Other chronic pain: Secondary | ICD-10-CM | POA: Diagnosis not present

## 2018-03-24 DIAGNOSIS — M542 Cervicalgia: Secondary | ICD-10-CM | POA: Diagnosis not present

## 2018-03-24 DIAGNOSIS — M25511 Pain in right shoulder: Secondary | ICD-10-CM | POA: Diagnosis not present

## 2018-03-24 NOTE — Telephone Encounter (Signed)
Please advise 

## 2018-03-24 NOTE — Progress Notes (Signed)
The patient is well-known to me.  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  He is now 63 years old.  He still having a lot of right-hand-dominant hand pain with gripping activities.  He is mainly the index and middle fingers at the MCP joints that have a lot of pain.  He also has left shoulder pain and numbness and tingling that is in both hands.  He does have neck pain as well.  I have seen him for all these things before.  We have had him on anti-inflammatories as well as muscle relaxants and Neurontin.  Today I suggested he try Tumeric or even glucosamine.  On exam he seems to have a positive Spurling sign to the right side when stressing the neck.  He has fluid and full range of motion of his right shoulder.  His right hand second and third rays are very painful at the MCP joint.  He has weak grip strength on both sides.  At this point I will obtain an MRI of the cervical spine to rule out nerve compression and stenosis based on his symptoms.  He will continue the other medications as they have been prescribed as well as the supplements that can hopefully help with his joint aches and pains.  We will see him back in 2 weeks to go over the MRI of the cervical spine.

## 2018-03-25 ENCOUNTER — Other Ambulatory Visit (INDEPENDENT_AMBULATORY_CARE_PROVIDER_SITE_OTHER): Payer: Self-pay

## 2018-03-25 DIAGNOSIS — M4807 Spinal stenosis, lumbosacral region: Secondary | ICD-10-CM

## 2018-03-26 ENCOUNTER — Other Ambulatory Visit: Payer: Self-pay | Admitting: Orthopaedic Surgery

## 2018-03-26 ENCOUNTER — Other Ambulatory Visit (INDEPENDENT_AMBULATORY_CARE_PROVIDER_SITE_OTHER): Payer: Self-pay | Admitting: Orthopaedic Surgery

## 2018-03-28 ENCOUNTER — Ambulatory Visit
Admission: RE | Admit: 2018-03-28 | Discharge: 2018-03-28 | Disposition: A | Discharge status: Home / Self Care | Payer: PRIVATE HEALTH INSURANCE | Source: Ambulatory Visit | Attending: Orthopaedic Surgery | Admitting: Orthopaedic Surgery

## 2018-03-28 DIAGNOSIS — M5023 Other cervical disc displacement, cervicothoracic region: Secondary | ICD-10-CM | POA: Diagnosis not present

## 2018-03-28 DIAGNOSIS — M50222 Other cervical disc displacement at C5-C6 level: Secondary | ICD-10-CM | POA: Diagnosis not present

## 2018-03-28 DIAGNOSIS — M50223 Other cervical disc displacement at C6-C7 level: Secondary | ICD-10-CM | POA: Diagnosis not present

## 2018-03-28 DIAGNOSIS — M47812 Spondylosis without myelopathy or radiculopathy, cervical region: Secondary | ICD-10-CM | POA: Diagnosis not present

## 2018-03-28 DIAGNOSIS — M4807 Spinal stenosis, lumbosacral region: Secondary | ICD-10-CM

## 2018-03-29 ENCOUNTER — Telehealth (INDEPENDENT_AMBULATORY_CARE_PROVIDER_SITE_OTHER): Payer: Self-pay | Admitting: Orthopaedic Surgery

## 2018-03-29 NOTE — Telephone Encounter (Signed)
Please advise 

## 2018-03-29 NOTE — Telephone Encounter (Signed)
Pharmacy  [address]   Medication refill  Prednisone

## 2018-03-30 MED ORDER — METHYLPREDNISOLONE 4 MG PO TABS: ORAL_TABLET | ORAL | 0 refills | Status: DC

## 2018-03-30 NOTE — Telephone Encounter (Signed)
Please advise 

## 2018-03-31 ENCOUNTER — Ambulatory Visit (INDEPENDENT_AMBULATORY_CARE_PROVIDER_SITE_OTHER): Payer: BLUE CROSS/BLUE SHIELD | Admitting: Orthopaedic Surgery

## 2018-03-31 ENCOUNTER — Encounter (INDEPENDENT_AMBULATORY_CARE_PROVIDER_SITE_OTHER): Payer: Self-pay | Admitting: Orthopaedic Surgery

## 2018-03-31 DIAGNOSIS — M542 Cervicalgia: Secondary | ICD-10-CM | POA: Diagnosis not present
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
## 2018-04-01 ENCOUNTER — Other Ambulatory Visit (INDEPENDENT_AMBULATORY_CARE_PROVIDER_SITE_OTHER): Payer: Self-pay

## 2018-04-01 DIAGNOSIS — M4802 Spinal stenosis, cervical region: Secondary | ICD-10-CM

## 2018-04-06 DIAGNOSIS — M5412 Radiculopathy, cervical region: Secondary | ICD-10-CM | POA: Diagnosis not present

## 2018-04-06 DIAGNOSIS — M542 Cervicalgia: Secondary | ICD-10-CM | POA: Diagnosis not present

## 2018-04-06 DIAGNOSIS — M5416 Radiculopathy, lumbar region: Secondary | ICD-10-CM | POA: Diagnosis not present

## 2018-04-08 ENCOUNTER — Ambulatory Visit (INDEPENDENT_AMBULATORY_CARE_PROVIDER_SITE_OTHER): Payer: BLUE CROSS/BLUE SHIELD | Admitting: Orthopaedic Surgery

## 2018-04-09 DIAGNOSIS — M47812 Spondylosis without myelopathy or radiculopathy, cervical region: Secondary | ICD-10-CM | POA: Diagnosis not present

## 2018-04-09 DIAGNOSIS — M5412 Radiculopathy, cervical region: Secondary | ICD-10-CM | POA: Diagnosis not present

## 2018-04-09 DIAGNOSIS — M4802 Spinal stenosis, cervical region: Secondary | ICD-10-CM | POA: Diagnosis not present

## 2018-04-13 ENCOUNTER — Encounter (INDEPENDENT_AMBULATORY_CARE_PROVIDER_SITE_OTHER): Payer: Self-pay

## 2018-04-13 ENCOUNTER — Telehealth (INDEPENDENT_AMBULATORY_CARE_PROVIDER_SITE_OTHER): Payer: Self-pay | Admitting: Orthopaedic Surgery

## 2018-04-13 NOTE — Telephone Encounter (Signed)
Patients wife is aware note ready at front desk

## 2018-04-13 NOTE — Telephone Encounter (Signed)
Pts wife called requesting for Dr. Magnus Ivan to write him a note to be out of work for 2 weeks.   The Wife states that his back and shoulder has progressively gotten worse and causing him a lot of pain.

## 2018-04-14 ENCOUNTER — Other Ambulatory Visit: Payer: Self-pay | Admitting: Student

## 2018-04-14 DIAGNOSIS — M5412 Radiculopathy, cervical region: Secondary | ICD-10-CM | POA: Diagnosis not present

## 2018-04-14 DIAGNOSIS — M5416 Radiculopathy, lumbar region: Secondary | ICD-10-CM

## 2018-04-14 DIAGNOSIS — Z6829 Body mass index (BMI) 29.0-29.9, adult: Secondary | ICD-10-CM | POA: Diagnosis not present

## 2018-04-14 DIAGNOSIS — M4802 Spinal stenosis, cervical region: Secondary | ICD-10-CM | POA: Diagnosis not present

## 2018-04-15 ENCOUNTER — Other Ambulatory Visit: Payer: Self-pay | Admitting: Student

## 2018-04-18 ENCOUNTER — Ambulatory Visit
Admission: RE | Admit: 2018-04-18 | Discharge: 2018-04-18 | Disposition: A | Discharge status: Home / Self Care | Payer: BLUE CROSS/BLUE SHIELD | Source: Ambulatory Visit | Attending: Student | Admitting: Student

## 2018-04-18 DIAGNOSIS — M48061 Spinal stenosis, lumbar region without neurogenic claudication: Secondary | ICD-10-CM | POA: Diagnosis not present

## 2018-04-18 DIAGNOSIS — M5416 Radiculopathy, lumbar region: Secondary | ICD-10-CM

## 2018-04-20 ENCOUNTER — Other Ambulatory Visit (INDEPENDENT_AMBULATORY_CARE_PROVIDER_SITE_OTHER): Payer: Self-pay | Admitting: Orthopaedic Surgery

## 2018-04-20 NOTE — Telephone Encounter (Signed)
Please advise 

## 2018-04-22 DIAGNOSIS — M542 Cervicalgia: Secondary | ICD-10-CM | POA: Diagnosis not present

## 2018-04-22 DIAGNOSIS — M5126 Other intervertebral disc displacement, lumbar region: Secondary | ICD-10-CM | POA: Diagnosis not present

## 2018-04-23 ENCOUNTER — Other Ambulatory Visit: Payer: PRIVATE HEALTH INSURANCE

## 2018-04-28 ENCOUNTER — Other Ambulatory Visit (INDEPENDENT_AMBULATORY_CARE_PROVIDER_SITE_OTHER): Payer: Self-pay | Admitting: Orthopaedic Surgery

## 2018-04-28 DIAGNOSIS — M5416 Radiculopathy, lumbar region: Secondary | ICD-10-CM | POA: Diagnosis not present

## 2018-04-28 DIAGNOSIS — Z6829 Body mass index (BMI) 29.0-29.9, adult: Secondary | ICD-10-CM | POA: Diagnosis not present

## 2018-04-28 DIAGNOSIS — M5126 Other intervertebral disc displacement, lumbar region: Secondary | ICD-10-CM | POA: Diagnosis not present

## 2018-04-29 ENCOUNTER — Institutional Professional Consult (permissible substitution): Payer: Self-pay | Admitting: Neurology

## 2018-04-29 NOTE — Telephone Encounter (Signed)
Please advise 

## 2018-05-05 ENCOUNTER — Institutional Professional Consult (permissible substitution): Payer: Self-pay | Admitting: Neurology

## 2018-05-08 ENCOUNTER — Other Ambulatory Visit (INDEPENDENT_AMBULATORY_CARE_PROVIDER_SITE_OTHER): Payer: Self-pay | Admitting: Orthopaedic Surgery

## 2018-05-10 NOTE — Telephone Encounter (Signed)
Please advise 

## 2018-05-13 DIAGNOSIS — M5126 Other intervertebral disc displacement, lumbar region: Secondary | ICD-10-CM | POA: Diagnosis not present

## 2018-05-13 DIAGNOSIS — M542 Cervicalgia: Secondary | ICD-10-CM | POA: Diagnosis not present

## 2018-05-14 ENCOUNTER — Ambulatory Visit (HOSPITAL_COMMUNITY)
Admission: RE | Admit: 2018-05-14 | Discharge: 2018-05-14 | Disposition: A | Discharge status: Home / Self Care | Payer: BLUE CROSS/BLUE SHIELD | Source: Ambulatory Visit | Attending: Neurosurgery | Admitting: Neurosurgery

## 2018-05-14 ENCOUNTER — Other Ambulatory Visit (HOSPITAL_COMMUNITY): Payer: Self-pay | Admitting: Neurosurgery

## 2018-05-14 DIAGNOSIS — I739 Peripheral vascular disease, unspecified: Secondary | ICD-10-CM | POA: Diagnosis not present

## 2018-05-19 DIAGNOSIS — R7309 Other abnormal glucose: Secondary | ICD-10-CM | POA: Diagnosis not present

## 2018-05-26 DIAGNOSIS — F17210 Nicotine dependence, cigarettes, uncomplicated: Secondary | ICD-10-CM | POA: Diagnosis not present

## 2018-05-26 DIAGNOSIS — Z23 Encounter for immunization: Secondary | ICD-10-CM | POA: Diagnosis not present

## 2018-05-26 DIAGNOSIS — R7309 Other abnormal glucose: Secondary | ICD-10-CM | POA: Diagnosis not present

## 2018-05-26 DIAGNOSIS — J988 Other specified respiratory disorders: Secondary | ICD-10-CM | POA: Diagnosis not present

## 2018-05-26 DIAGNOSIS — E785 Hyperlipidemia, unspecified: Secondary | ICD-10-CM | POA: Diagnosis not present

## 2018-05-27 ENCOUNTER — Other Ambulatory Visit (INDEPENDENT_AMBULATORY_CARE_PROVIDER_SITE_OTHER): Payer: Self-pay | Admitting: Orthopaedic Surgery

## 2018-05-29 ENCOUNTER — Other Ambulatory Visit (INDEPENDENT_AMBULATORY_CARE_PROVIDER_SITE_OTHER): Payer: Self-pay | Admitting: Orthopaedic Surgery

## 2018-05-31 NOTE — Telephone Encounter (Signed)
Please advise 

## 2018-06-07 ENCOUNTER — Telehealth (INDEPENDENT_AMBULATORY_CARE_PROVIDER_SITE_OTHER): Payer: Self-pay | Admitting: Orthopaedic Surgery

## 2018-06-07 ENCOUNTER — Ambulatory Visit (INDEPENDENT_AMBULATORY_CARE_PROVIDER_SITE_OTHER): Payer: BLUE CROSS/BLUE SHIELD | Admitting: Orthopaedic Surgery

## 2018-06-07 ENCOUNTER — Other Ambulatory Visit (INDEPENDENT_AMBULATORY_CARE_PROVIDER_SITE_OTHER): Payer: Self-pay | Admitting: Orthopaedic Surgery

## 2018-06-07 DIAGNOSIS — M1711 Unilateral primary osteoarthritis, right knee: Secondary | ICD-10-CM

## 2018-06-07 DIAGNOSIS — G8929 Other chronic pain: Secondary | ICD-10-CM

## 2018-06-07 DIAGNOSIS — M25561 Pain in right knee: Secondary | ICD-10-CM

## 2018-06-07 MED ORDER — METHYLPREDNISOLONE ACETATE 40 MG/ML IJ SUSP
40.0000 mg | INTRAMUSCULAR | Status: AC | PRN
  Administered 2018-06-07: 40 mg via INTRA_ARTICULAR

## 2018-06-07 MED ORDER — LIDOCAINE HCL 1 % IJ SOLN
3.0000 mL | INTRAMUSCULAR | Status: AC | PRN
  Administered 2018-06-07: 3 mL

## 2018-06-07 NOTE — Progress Notes (Signed)
Office Visit Note   Patient: [name]           Date of Birth: [birth year]           MRN: [id number] Visit Date: 06/07/2018              Requested by: No referring provider defined for this encounter. PCP: Patient, No Pcp Per   Assessment & Plan: Visit Diagnoses:  1. Chronic pain of right knee   2. Unilateral primary osteoarthritis, right knee     Plan: I did provide a steroid injection per his wishes in his right knee.  I counseled him about the risk and benefits of steroid injections and he tolerated it well.  We will see him back in about 6 weeks to see how he is doing overall.  I do feel that he should apply for full body disability based on his multiple multiple skeletal issues.  Follow-Up Instructions: Return in about 6 weeks (around 07/19/2018).   Orders:  Orders Placed This Encounter  Procedures  . Large Joint Inj   No orders of the defined types were placed in this encounter.     Procedures: Large Joint Inj: R knee on 06/07/2018 8:47 AM Indications: diagnostic evaluation and pain Details: 22 G 1.5 in needle, superolateral approach  Arthrogram: No  Medications: 3 mL lidocaine 1 %; 40 mg methylPREDNISolone acetate 40 MG/ML Outcome: tolerated well, no immediate complications Procedure, treatment alternatives, risks and benefits explained, specific risks discussed. Consent was given by the patient. Immediately prior to procedure a time out was called to verify the correct patient, procedure, equipment, support staff and site/side marked as required. Patient was prepped and draped in the usual sterile fashion.       Clinical Data: No additional findings.   Subjective: Chief Complaint  Patient presents with  . Right Knee - Pain  The patient comes in today with significant right knee pain.  This is been a chronic issue for him.  He is requesting a steroid injection today as well.  He is also been dealing with chronic neck and back issues and shoulder  issues.  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  This is been a struggle for him.  He is not a diabetic.  His knee is flared up quite a bit today and over the last several days.  Is making it difficult for him to walk.  It hurts at night and during the daytime.  HPI  Review of Systems He currently denies any headache, chest pain, shortness of breath, fever, chills, nausea, vomiting  Objective: Vital Signs: There were no vitals taken for this visit.  Physical Exam He is alert and orient x3 and in no acute distress Ortho Exam Examination of his right knee shows varus malalignment.  There is a mild effusion.  There is medial joint line tenderness and patellofemoral rotation.  His knee is ligamentously stable. Specialty Comments:  No specialty comments available.  Imaging: No results found.   PMFS History: Patient Active Problem List   Diagnosis Date Noted  . Impingement syndrome of right shoulder 12/24/2016  . Chronic right shoulder pain 11/26/2016  . Neck pain 11/04/2016  . Chronic bilateral low back pain with bilateral sciatica 11/04/2016   No past medical history on file.  No family history on file.  No past surgical history on file. Social History   Occupational History  . Not on file  Tobacco Use  . Smoking status: Current Every Day  Smoker    Types: Cigarettes  . Smokeless tobacco: Never Used  Substance and Sexual Activity  . Alcohol use: Yes  . Drug use: No  . Sexual activity: Not on file

## 2018-06-07 NOTE — Telephone Encounter (Signed)
Working on PA

## 2018-06-07 NOTE — Telephone Encounter (Signed)
Patient's wife called stating that the patient's Robaxin needs to have a PA before the pharmacy can refill it.  CB#[phone].  Thank you.

## 2018-06-08 DIAGNOSIS — Z6829 Body mass index (BMI) 29.0-29.9, adult: Secondary | ICD-10-CM | POA: Diagnosis not present

## 2018-06-08 DIAGNOSIS — Z1212 Encounter for screening for malignant neoplasm of rectum: Secondary | ICD-10-CM | POA: Diagnosis not present

## 2018-06-08 DIAGNOSIS — Z1211 Encounter for screening for malignant neoplasm of colon: Secondary | ICD-10-CM | POA: Diagnosis not present

## 2018-06-08 DIAGNOSIS — M5416 Radiculopathy, lumbar region: Secondary | ICD-10-CM | POA: Diagnosis not present

## 2018-06-09 ENCOUNTER — Other Ambulatory Visit (INDEPENDENT_AMBULATORY_CARE_PROVIDER_SITE_OTHER): Payer: Self-pay | Admitting: Orthopaedic Surgery

## 2018-06-30 ENCOUNTER — Other Ambulatory Visit (INDEPENDENT_AMBULATORY_CARE_PROVIDER_SITE_OTHER): Payer: Self-pay | Admitting: Orthopaedic Surgery

## 2018-06-30 NOTE — Telephone Encounter (Signed)
Please advise 

## 2018-07-05 ENCOUNTER — Other Ambulatory Visit (INDEPENDENT_AMBULATORY_CARE_PROVIDER_SITE_OTHER): Payer: Self-pay | Admitting: Orthopaedic Surgery

## 2018-07-05 NOTE — Telephone Encounter (Signed)
Please advise 

## 2018-07-20 ENCOUNTER — Encounter: Payer: Self-pay | Admitting: Orthopaedic Surgery

## 2018-07-20 ENCOUNTER — Ambulatory Visit (INDEPENDENT_AMBULATORY_CARE_PROVIDER_SITE_OTHER): Payer: BLUE CROSS/BLUE SHIELD | Admitting: Orthopaedic Surgery

## 2018-07-20 ENCOUNTER — Other Ambulatory Visit: Payer: Self-pay

## 2018-07-20 DIAGNOSIS — M1711 Unilateral primary osteoarthritis, right knee: Secondary | ICD-10-CM

## 2018-07-20 DIAGNOSIS — G8929 Other chronic pain: Secondary | ICD-10-CM | POA: Diagnosis not present

## 2018-07-20 DIAGNOSIS — M25561 Pain in right knee: Secondary | ICD-10-CM | POA: Diagnosis not present

## 2018-07-20 NOTE — Progress Notes (Signed)
The patient is following up after having had an injection in his right knee in late March to treat the pain from osteoarthritis.  I seen him for this knee for over a decade now.  He said the injection has been very helpful.  I had referred him to neurosurgery for his neck and back issues.  They are trying injections for now.  Those have not been as helpful but they have at least lasted longer.  He is a smoker self counseled him about stopping smoking.  Again he says his right knee is doing well overall.  On exam his right knee does have varus malalignment.  The range of motion is better and there is no effusion today.  His pain is minimal.  The right knee feels ligamentously stable.  At this point we will follow-up as needed.  If he has any issues at all any time he will let us know.

## 2018-07-31 ENCOUNTER — Other Ambulatory Visit (INDEPENDENT_AMBULATORY_CARE_PROVIDER_SITE_OTHER): Payer: Self-pay | Admitting: Orthopaedic Surgery

## 2018-08-07 ENCOUNTER — Other Ambulatory Visit (INDEPENDENT_AMBULATORY_CARE_PROVIDER_SITE_OTHER): Payer: Self-pay | Admitting: Orthopaedic Surgery

## 2018-08-08 NOTE — Telephone Encounter (Signed)
Rx request 

## 2018-08-25 ENCOUNTER — Other Ambulatory Visit (INDEPENDENT_AMBULATORY_CARE_PROVIDER_SITE_OTHER): Payer: Self-pay | Admitting: Orthopaedic Surgery

## 2018-09-02 ENCOUNTER — Other Ambulatory Visit (INDEPENDENT_AMBULATORY_CARE_PROVIDER_SITE_OTHER): Payer: Self-pay | Admitting: Orthopaedic Surgery

## 2018-09-02 NOTE — Telephone Encounter (Signed)
Please advise 

## 2018-09-05 ENCOUNTER — Other Ambulatory Visit (INDEPENDENT_AMBULATORY_CARE_PROVIDER_SITE_OTHER): Payer: Self-pay | Admitting: Orthopaedic Surgery

## 2018-09-06 NOTE — Telephone Encounter (Signed)
Please advise 

## 2018-09-09 DIAGNOSIS — E785 Hyperlipidemia, unspecified: Secondary | ICD-10-CM | POA: Diagnosis not present

## 2018-09-09 DIAGNOSIS — R7309 Other abnormal glucose: Secondary | ICD-10-CM | POA: Diagnosis not present

## 2018-09-09 DIAGNOSIS — Z0000 Encounter for general adult medical examination without abnormal findings: Secondary | ICD-10-CM | POA: Diagnosis not present

## 2018-09-09 DIAGNOSIS — E559 Vitamin D deficiency, unspecified: Secondary | ICD-10-CM | POA: Diagnosis not present

## 2018-09-17 ENCOUNTER — Other Ambulatory Visit (INDEPENDENT_AMBULATORY_CARE_PROVIDER_SITE_OTHER): Payer: Self-pay | Admitting: Orthopaedic Surgery

## 2018-09-17 NOTE — Telephone Encounter (Signed)
Ok to rf? 

## 2018-09-27 DIAGNOSIS — Z5181 Encounter for therapeutic drug level monitoring: Secondary | ICD-10-CM | POA: Diagnosis not present

## 2018-09-27 DIAGNOSIS — Z0000 Encounter for general adult medical examination without abnormal findings: Secondary | ICD-10-CM | POA: Diagnosis not present

## 2018-09-27 DIAGNOSIS — F411 Generalized anxiety disorder: Secondary | ICD-10-CM | POA: Diagnosis not present

## 2018-09-27 DIAGNOSIS — E785 Hyperlipidemia, unspecified: Secondary | ICD-10-CM | POA: Diagnosis not present

## 2018-09-27 DIAGNOSIS — R7309 Other abnormal glucose: Secondary | ICD-10-CM | POA: Diagnosis not present

## 2018-12-07 DIAGNOSIS — Z23 Encounter for immunization: Secondary | ICD-10-CM | POA: Diagnosis not present

## 2019-02-08 DIAGNOSIS — E785 Hyperlipidemia, unspecified: Secondary | ICD-10-CM | POA: Diagnosis not present

## 2019-02-08 DIAGNOSIS — R7309 Other abnormal glucose: Secondary | ICD-10-CM | POA: Diagnosis not present

## 2019-03-14 ENCOUNTER — Other Ambulatory Visit: Payer: Self-pay

## 2019-03-14 ENCOUNTER — Other Ambulatory Visit (INDEPENDENT_AMBULATORY_CARE_PROVIDER_SITE_OTHER): Payer: Self-pay | Admitting: Orthopaedic Surgery

## 2019-03-14 MED ORDER — METHOCARBAMOL 500 MG PO TABS: 500.0000 mg | ORAL_TABLET | Freq: Three times a day (TID) | ORAL | 0 refills | Status: DC

## 2019-05-04 ENCOUNTER — Other Ambulatory Visit: Payer: Self-pay | Admitting: Radiology

## 2019-05-04 ENCOUNTER — Ambulatory Visit (INDEPENDENT_AMBULATORY_CARE_PROVIDER_SITE_OTHER): Payer: 59 | Admitting: Orthopaedic Surgery

## 2019-05-04 ENCOUNTER — Other Ambulatory Visit: Payer: Self-pay

## 2019-05-04 ENCOUNTER — Encounter: Payer: Self-pay | Admitting: Orthopaedic Surgery

## 2019-05-04 DIAGNOSIS — M4807 Spinal stenosis, lumbosacral region: Secondary | ICD-10-CM | POA: Diagnosis not present

## 2019-05-04 DIAGNOSIS — M1711 Unilateral primary osteoarthritis, right knee: Secondary | ICD-10-CM | POA: Diagnosis not present

## 2019-05-04 DIAGNOSIS — M4802 Spinal stenosis, cervical region: Secondary | ICD-10-CM

## 2019-05-04 MED ORDER — METHYLPREDNISOLONE ACETATE 40 MG/ML IJ SUSP
40.0000 mg | INTRAMUSCULAR | Status: AC | PRN
  Administered 2019-05-04: 40 mg via INTRA_ARTICULAR

## 2019-05-04 MED ORDER — LIDOCAINE HCL 1 % IJ SOLN
3.0000 mL | INTRAMUSCULAR | Status: AC | PRN
  Administered 2019-05-04: 10:00:00 3 mL

## 2019-05-04 NOTE — Progress Notes (Signed)
Office Visit Note   Patient: [name]           Date of Birth: 13-Mar-1955           MRN: [id number] Visit Date: 05/04/2019              Requested by: No referring provider defined for this encounter. PCP: Patient, No Pcp Per   Assessment & Plan: Visit Diagnoses:  1. Unilateral primary osteoarthritis, right knee   2. Spinal stenosis of lumbosacral region   3. Spinal stenosis of cervical region     Plan:  We will refer him for lumbar epidural steroid injections in cervical spine ESI with Dr. Ernestina Patches per patient's request.  He is given a form for handicap placard.  He will follow-up with [place] in regards to his knee on an as-needed basis.  Questions were encouraged and answered by Dr. Mardee Postin myself.  Would like repeat AP and lateral views of his right knee at next office visit prior to injection.  Follow-Up Instructions: Return if symptoms worsen or fail to improve.   Orders:  Orders Placed This Encounter  Procedures  . Large Joint Inj   No orders of the defined types were placed in this encounter.     Procedures: Large Joint Inj: R knee on 05/04/2019 9:33 AM Indications: pain Details: 22 G 1.5 in needle, anterolateral approach  Arthrogram: No  Medications: 3 mL lidocaine 1 %; 40 mg methylPREDNISolone acetate 40 MG/ML Outcome: tolerated well, no immediate complications Procedure, treatment alternatives, risks and benefits explained, specific risks discussed. Consent was given by the patient. Immediately prior to procedure a time out was called to verify the correct patient, procedure, equipment, support staff and site/side marked as required. Patient was prepped and draped in the usual sterile fashion.       Clinical Data: No additional findings.   Subjective: Chief Complaint  Patient presents with  . Right Knee - Pain    HPI Mr. Lamison returns today Right knee injection.  He has known osteoarthritis right knee.  He has had no new injury.  Not describe  significant mechanical symptoms.  States the last injection from 06/07/2018 did well until just recently.  States he has had increased pain to the right with cold weather with.  He is also asking to see Dr. Ernestina Patches for lumbar epidural steroid injection he states last injections done by Dr. Ernestina Patches helped for about 2 years.  He is also wanting ESI of the cervical spine.  States has had a change in his neck pain or his low back pain.  Is requesting handicap placard.  Review of Systems  Constitutional: Negative for activity change, chills and fever.  Cardiovascular: Negative for chest pain.  Musculoskeletal: Positive for arthralgias, back pain and neck pain.     Objective: Vital Signs: There were no vitals taken for this visit.  Physical Exam Constitutional:      Appearance: He is not ill-appearing or diaphoretic.  Pulmonary:     Effort: Pulmonary effort is normal.  Neurological:     Mental Status: He is alert and oriented to person, place, and time.  Psychiatric:        Mood and Affect: Mood normal.     Ortho Exam Right knee full extension full flexion without pain.  Tenderness along medial joint line.  No instability valgus varus stressing.  No effusion abnormal warmth erythema right knee. Specialty Comments:  No specialty comments available.  Imaging: No results found.   [place]  History: Patient Active Problem List   Diagnosis Date Noted  . Impingement syndrome of right shoulder 12/24/2016  . Chronic right shoulder pain 11/26/2016  . Neck pain 11/04/2016  . Chronic bilateral low back pain with bilateral sciatica 11/04/2016   History reviewed. No pertinent past medical history.  History reviewed. No pertinent family history.  History reviewed. No pertinent surgical history. Social History   Occupational History  . Not on file  Tobacco Use  . Smoking status: Current Every Day Smoker    Types: Cigarettes  . Smokeless tobacco: Never Used  Substance and Sexual Activity  .  Alcohol use: Yes  . Drug use: No  . Sexual activity: Not on file

## 2019-05-16 ENCOUNTER — Ambulatory Visit (INDEPENDENT_AMBULATORY_CARE_PROVIDER_SITE_OTHER): Payer: 59

## 2019-05-16 ENCOUNTER — Encounter: Payer: Self-pay | Admitting: Orthopaedic Surgery

## 2019-05-16 ENCOUNTER — Ambulatory Visit (INDEPENDENT_AMBULATORY_CARE_PROVIDER_SITE_OTHER): Payer: 59 | Admitting: Orthopaedic Surgery

## 2019-05-16 ENCOUNTER — Other Ambulatory Visit: Payer: Self-pay

## 2019-05-16 DIAGNOSIS — M25561 Pain in right knee: Secondary | ICD-10-CM

## 2019-05-16 DIAGNOSIS — M25521 Pain in right elbow: Secondary | ICD-10-CM

## 2019-05-16 DIAGNOSIS — G8929 Other chronic pain: Secondary | ICD-10-CM

## 2019-05-16 MED ORDER — LIDOCAINE HCL 1 % IJ SOLN
3.0000 mL | INTRAMUSCULAR | Status: AC | PRN
  Administered 2019-05-16: 3 mL

## 2019-05-16 MED ORDER — LIDOCAINE HCL 1 % IJ SOLN
1.0000 mL | INTRAMUSCULAR | Status: AC | PRN
  Administered 2019-05-16: 1 mL

## 2019-05-16 MED ORDER — METHYLPREDNISOLONE ACETATE 40 MG/ML IJ SUSP
40.0000 mg | INTRAMUSCULAR | Status: AC | PRN
  Administered 2019-05-16: 16:00:00 40 mg via INTRA_ARTICULAR

## 2019-05-16 NOTE — Progress Notes (Signed)
Office Visit Note   Patient: [name]           Date of Birth: June 03, 1955           MRN: [id number] Visit Date: 05/16/2019              Requested by: No referring provider defined for this encounter. PCP: Dalton Reichmann, DO   Assessment & Plan: Visit Diagnoses:  1. Chronic pain of right knee   2. Pain in right elbow     Plan: He is advised to keep pressure off the elbow.  We'll see how he does with the olecranon injection and the right knee injection.  Follow-up with [place] as needed.  Questions encouraged and answered at length by Dr. Magnus Ivan and myself.  Follow-Up Instructions: Return if symptoms worsen or fail to improve.   Orders:  Orders Placed This Encounter  Procedures  . Large Joint Inj: R knee  . Medium Joint Inj: R olecranon bursa  . XR Knee 1-2 Views Right  . XR Elbow 2 Views Right   No orders of the defined types were placed in this encounter.     Procedures: Large Joint Inj: R knee on 05/16/2019 4:07 PM Indications: pain Details: 22 G 1.5 in needle, anterolateral approach  Arthrogram: No  Medications: 3 mL lidocaine 1 %; 40 mg methylPREDNISolone acetate 40 MG/ML Outcome: tolerated well, no immediate complications Procedure, treatment alternatives, risks and benefits explained, specific risks discussed. Consent was given by the patient. Immediately prior to procedure a time out was called to verify the correct patient, procedure, equipment, support staff and site/side marked as required. Patient was prepped and draped in the usual sterile fashion.   Medium Joint Inj: R olecranon bursa on 05/16/2019 4:09 PM Medications: 1 mL lidocaine 1 %; 40 mg methylPREDNISolone acetate 40 MG/ML      Clinical Data: No additional findings.   Subjective: Chief Complaint  Patient presents with  . Right Elbow - Pain    HPI  Review of Systems   Objective: Vital Signs: There were no vitals taken for this visit.  Physical Exam Constitutional:       Appearance: He is not ill-appearing or diaphoretic.  Pulmonary:     Effort: Pulmonary effort is normal.  Neurological:     Mental Status: He is alert and oriented to person, place, and time.  Psychiatric:        Behavior: Behavior normal.     Ortho Exam Right knee: No abnormal warmth, fusion, or erythema.  No instability valgus varus stressing of either knee.  Right knee with significant crepitus with passive range of motion.  Tenderness along medial joint line of the right knee.  Right elbow good range of motion without significant pain.  Tenderness over the olecranon process region.  No evidence of infection.  Triceps is intact.  Has full supination pronation forearm.  Nontender along medial lateral epicondyle of the right elbow.  Specialty Comments:  No specialty comments available.  Imaging: XR Elbow 2 Views Right  Result Date: 05/16/2019 Right elbow: Arthritic changes about the elbow.  Osteophyte off the olecranon.  Elbow is well located.  No bony abnormalities.  No acute fracture  XR Knee 1-2 Views Right  Result Date: 05/16/2019 Right knee 2 views: No acute fractures.  No subluxation dislocation.  No effusion.  Medial joint line moderate narrowing with blunting of the medial femoral condyle.    PMFS History: Patient Active Problem List   Diagnosis Date Noted  .  Impingement syndrome of right shoulder 12/24/2016  . Chronic right shoulder pain 11/26/2016  . Neck pain 11/04/2016  . Chronic bilateral low back pain with bilateral sciatica 11/04/2016   History reviewed. No pertinent past medical history.  History reviewed. No pertinent family history.  History reviewed. No pertinent surgical history. Social History   Occupational History  . Not on file  Tobacco Use  . Smoking status: Current Every Day Smoker    Types: Cigarettes  . Smokeless tobacco: Never Used  Substance and Sexual Activity  . Alcohol use: Yes  . Drug use: No  . Sexual activity: Not on file

## 2019-05-31 ENCOUNTER — Ambulatory Visit: Payer: Self-pay

## 2019-05-31 ENCOUNTER — Ambulatory Visit (INDEPENDENT_AMBULATORY_CARE_PROVIDER_SITE_OTHER): Payer: 59 | Admitting: Physical Medicine and Rehabilitation

## 2019-05-31 ENCOUNTER — Other Ambulatory Visit: Payer: Self-pay

## 2019-05-31 ENCOUNTER — Encounter: Payer: Self-pay | Admitting: Physical Medicine and Rehabilitation

## 2019-05-31 VITALS — BP 104/64 | HR 70 | Ht 68.0 in | Wt 200.0 lb

## 2019-05-31 DIAGNOSIS — M542 Cervicalgia: Secondary | ICD-10-CM | POA: Diagnosis not present

## 2019-05-31 DIAGNOSIS — M5416 Radiculopathy, lumbar region: Secondary | ICD-10-CM

## 2019-05-31 DIAGNOSIS — M4802 Spinal stenosis, cervical region: Secondary | ICD-10-CM

## 2019-05-31 DIAGNOSIS — M48062 Spinal stenosis, lumbar region with neurogenic claudication: Secondary | ICD-10-CM | POA: Diagnosis not present

## 2019-05-31 DIAGNOSIS — M25511 Pain in right shoulder: Secondary | ICD-10-CM | POA: Diagnosis not present

## 2019-05-31 DIAGNOSIS — M5010 Cervical disc disorder with radiculopathy, unspecified cervical region: Secondary | ICD-10-CM

## 2019-05-31 DIAGNOSIS — G8929 Other chronic pain: Secondary | ICD-10-CM

## 2019-05-31 MED ORDER — METHYLPREDNISOLONE ACETATE 80 MG/ML IJ SUSP
40.0000 mg | Freq: Once | INTRAMUSCULAR | Status: AC
  Administered 2019-05-31: 40 mg

## 2019-05-31 NOTE — Progress Notes (Signed)
 .  Numeric Pain Rating Scale and Functional Assessment Average Pain 8 Pain Right Now 9 My pain is constant, sharp and aching Pain is worse with: walking, some activites and laying down Pain improves with: rest   In the last MONTH (on 0-10 scale) has pain interfered with the following?  1. General activity like being  able to carry out your everyday physical activities such as walking, climbing stairs, carrying groceries, or moving a chair?  Rating(8)  2. Relation with others like being able to carry out your usual social activities and roles such as  activities at home, at work and in your community. Rating(8)  3. Enjoyment of life such that you have  been bothered by emotional problems such as feeling anxious, depressed or irritable?  Rating(6)

## 2019-06-01 ENCOUNTER — Telehealth: Payer: Self-pay | Admitting: *Deleted

## 2019-06-01 ENCOUNTER — Encounter: Payer: Self-pay | Admitting: Physical Medicine and Rehabilitation

## 2019-06-01 NOTE — Procedures (Signed)
Lumbar Epidural Steroid Injection - Interlaminar Approach with Fluoroscopic Guidance  Patient: [name]      Date of Birth: 05/13/1955 MRN: [id number] PCP: Irena Reichmann, DO      Visit Date: 05/31/2019   Universal Protocol:     Consent Given By: the patient  Position: PRONE  Additional Comments: Vital signs were monitored before and after the procedure. Patient was prepped and draped in the usual sterile fashion. The correct patient, procedure, and site was verified.   Injection Procedure Details:  Procedure Site One Meds Administered:  Meds ordered this encounter  Medications  . methylPREDNISolone acetate (DEPO-MEDROL) injection 40 mg     Laterality: Right  Location/Site:  L3-L4  Needle size: 20 G  Needle type: Tuohy  Needle Placement: Paramedian epidural  Findings:   -Comments: Excellent flow of contrast into the epidural space.  Procedure Details: Using a paramedian approach from the side mentioned above, the region overlying the inferior lamina was localized under fluoroscopic visualization and the soft tissues overlying this structure were infiltrated with 4 ml. of 1% Lidocaine without Epinephrine. The Tuohy needle was inserted into the epidural space using a paramedian approach.   The epidural space was localized using loss of resistance along with lateral and bi-planar fluoroscopic views.  After negative aspirate for air, blood, and CSF, a 2 ml. volume of Isovue-250 was injected into the epidural space and the flow of contrast was observed. Radiographs were obtained for documentation purposes.    The injectate was administered into the level noted above.   Additional Comments:  The patient tolerated the procedure well Dressing: 2 x 2 sterile gauze and Band-Aid    Post-procedure details: Patient was observed during the procedure. Post-procedure instructions were reviewed.  Patient left the clinic in stable condition.

## 2019-06-01 NOTE — Telephone Encounter (Signed)
Sent fax to patient insurance for [id number], case is pending

## 2019-06-01 NOTE — Progress Notes (Signed)
Judah Carchi - 64 y.o. male MRN [id number]  Date of birth: 12/07/1955  Office Visit Note: Visit Date: 05/31/2019 PCP: Irena Reichmann, DO Referred by: Irena Reichmann, DO  Subjective: Chief Complaint  Patient presents with  . Neck - Pain  . Right Shoulder - Pain  . Left Shoulder - Pain  . Right Arm - Pain  . Left Arm - Pain  . Lower Back - Pain  . Right Leg - Pain  . Left Leg - Pain   HPI: Cavion Faiola is a 64 y.o. male who comes in today For what was planned epidural injection at L3-4 at the request of Dr. Doneen Poisson now has turned into a pretty full evaluation and management of neck pain and bilateral shoulder and arm pain as well as low back and bilateral leg pain.  We had gotten preauthorization already for the epidural injection.  Briefly I have seen the patient in the past and the last time I saw him was in 2018 and at that time we had completed epidural injection around the L3-4 region which did help him and we also completed at least one transforaminal approach at S1 because of just a different issue is having with more  root type pain that was consistent at that time with S1 nerve.  Since I have seen him he has had updated MRI of both the cervical and lumbar spine.  These were completed last year.  I did go over both of those with him using spine models and we did review the reports.  We reviewed the reports of his primary doctor as well as Dr. Magnus Ivan.  The patient also states he has been seeing Dr. Callie Fielding at [facility] and has received some injections there about a year ago without much relief.  He could not remember the surgeon's name that he saw there.  He has not had cervical or lumbar surgery.  He has been followed by Dr. Doneen Poisson in the office from an orthopedic standpoint.  His low back consist of again low back pain worse with standing and ambulating referring into both legs.  MRI does show level of stenosis at L3-4  and above that some discogenic endplate changes.  He has multilevel facet arthropathy but without high-grade focal narrowing or compression.  In terms of his neck and shoulder pain he has been working with Dr. Magnus Ivan for chronic right shoulder pain but also neck pain bilateral shoulder and arm pain.  He does get paresthesias in the hands.  He assures me that he does not have carpal tunnel syndrome but there is really no way for him to know that he has not had electrodiagnostic study and at least on the cervical MRI that I am looking at it would be hard pressed to say that somebody would get a lot of numbness and tingling in the hands but is not impossible.  His biggest complaint is his neck and shoulder and arm pain.  He has had injection again by Dr. Murray Hodgkins but he is not sure what was done of the cervical spine.  He has failed medication management otherwise including gabapentin anti-inflammatories and tramadol and tizanidine.  Review of Systems  Musculoskeletal: Positive for back pain, joint pain and neck pain.  Neurological: Positive for tingling.  All other systems reviewed and are negative.  Otherwise per HPI.  Assessment & Plan: Visit Diagnoses:  1. Lumbar radiculopathy   2. Spinal stenosis of lumbar region with neurogenic claudication  3. Neck pain   4. Chronic right shoulder pain   5. Cervical disc disorder with radiculopathy   6. Spinal stenosis of cervical region     Plan: Findings:  1.  Chronic worsening severe with exacerbation of low back and bilateral hip and leg pain consistent with neurogenic claudication and stenosis at L3-4 which is moderate.  No high-grade nerve compression.  I do think epidural injection is the right answer here to see if he gets better.  We will try an interlaminar approach at L3-4 today which was planned.  Would consider bilateral L3 transforaminal injection.  He does have some foraminal narrowing on the right at L5 but does not his biggest complaint.  He  will continue with home exercise and current medication.   2.  Chronic worsening neck pain with bilateral shoulder and arm pain with some paresthesia at times in the hands.  A lot of pain is nondermatomal with him both upper and lower extremity.  He has had treatment and injections by Dr. Callie Fielding.  Working to get him to sign a release form to get those notes to look at.  I am happy to try to review those and potentially do epidural injection of his cervical spine which is really what he needs when I looked at the MRI.  He will continue to see Dr. Magnus Ivan for his shoulder and see if that is more mechanical than related to the cervical spine but I think the epidural injection would be diagnostic at least to see if it helped the shoulder pain.  May consider regrouping with physical therapy but this could be looked at by Dr. Magnus Ivan.  If he got more tingling in the hands would recommend electrodiagnostic study to rule out carpal tunnel issues which I think is probably more likely.    Meds & Orders:  Meds ordered this encounter  Medications  . methylPREDNISolone acetate (DEPO-MEDROL) injection 40 mg    Orders Placed This Encounter  Procedures  . XR C-ARM NO REPORT  . Epidural Steroid injection    Follow-up: Return for Cervical epidural injection.   Procedures: No procedures performed  Lumbar Epidural Steroid Injection - Interlaminar Approach with Fluoroscopic Guidance  Patient: Araceli Coufal      Date of Birth: Jul 23, 1955 MRN: [id number] PCP: Irena Reichmann, DO      Visit Date: 05/31/2019   Universal Protocol:     Consent Given By: the patient  Position: PRONE  Additional Comments: Vital signs were monitored before and after the procedure. Patient was prepped and draped in the usual sterile fashion. The correct patient, procedure, and site was verified.   Injection Procedure Details:  Procedure Site One Meds Administered:  Meds ordered this encounter  Medications  .  methylPREDNISolone acetate (DEPO-MEDROL) injection 40 mg     Laterality: Right  Location/Site:  L3-L4  Needle size: 20 G  Needle type: Tuohy  Needle Placement: Paramedian epidural  Findings:   -Comments: Excellent flow of contrast into the epidural space.  Procedure Details: Using a paramedian approach from the side mentioned above, the region overlying the inferior lamina was localized under fluoroscopic visualization and the soft tissues overlying this structure were infiltrated with 4 ml. of 1% Lidocaine without Epinephrine. The Tuohy needle was inserted into the epidural space using a paramedian approach.   The epidural space was localized using loss of resistance along with lateral and bi-planar fluoroscopic views.  After negative aspirate for air, blood, and CSF, a 2 ml. volume of Isovue-250  was injected into the epidural space and the flow of contrast was observed. Radiographs were obtained for documentation purposes.    The injectate was administered into the level noted above.   Additional Comments:  The patient tolerated the procedure well Dressing: 2 x 2 sterile gauze and Band-Aid    Post-procedure details: Patient was observed during the procedure. Post-procedure instructions were reviewed.  Patient left the clinic in stable condition.    Clinical History: MRI LUMBAR SPINE WITHOUT CONTRAST    FINDINGS:  Segmentation: Standard lumbar numbering when compared to prior    Alignment: Retrolisthesis at L1-2 to L4-5, stable from prior and  greatest at L2-3 where there is 3 mm of slip.    Vertebrae: New/progressed discogenic endplate edema at A6-3 at L2-3.    Conus medullaris and cauda equina: Conus extends to the L2 level.  Conus and cauda equina appear normal.    Paraspinal and other soft tissues: Negative    Disc levels:    L1-L2: Disc degeneration with endplate edema posteriorly on both  sides. Small upward migrating central disc extrusion  without neural  contact.    L2-L3: Disc narrowing and bulge with endplate edema. Mild posterior  element hypertrophy. Moderate spinal stenosis. Best seen on axial  slices there is a left-sided herniation filling the left  subarticular recess and compressing the left L3 nerve root. Mild  left foraminal narrowing    L3-L4: Disc narrowing and bulge with mild posterior element  hypertrophy. Moderate spinal stenosis based on sagittal images.  Right subarticular recess narrowing impinging on the L4 nerve root.  A herniation was more discretely seen in this area on prior study.  The foramina are patent    L4-L5: Disc narrowing and bulge with endplate spurring. Mild  posterior element hypertrophy. No impingement    L5-S1:Disc narrowing with preferential right-sided height loss,  bulge, and endplate spur. Mild right facet spurring. Right foraminal  impingement. Patent canal and left foramen.    IMPRESSION:  1. Multilevel degenerative disease and retrolisthesis. Discogenic  endplate edema at K1-6 and L2-3 that has progressed from 2018.  2. L2-3 left paracentral herniation with L3 impingement in the  subarticular recess. Spinal stenosis is moderate at this level.  3. L3-4 moderate spinal stenosis. Continued right subarticular  recess stenosis and L4 impingement.  4. L5-S1 right foraminal impingement, progressed from 2018.      Electronically Signed  By: Marnee Spring M.D.  On: 04/18/2018 09:42  ------- MRI CERVICAL SPINE WITHOUT CONTRAST  TECHNIQUE: Multiplanar, multisequence MR imaging of the cervical spine was performed. No intravenous contrast was administered.  COMPARISON:  None.  FINDINGS: Alignment: Physiologic.  Vertebrae: No fracture, evidence of discitis, or bone lesion.  Cord: Normal signal and morphology.  Posterior Fossa, vertebral arteries, paraspinal tissues: Posterior fossa demonstrates no focal abnormality. Vertebral artery flow voids are  maintained. Paraspinal soft tissues are unremarkable.  Disc levels:  Discs: Degenerative disc disease with disc height loss at C3-4, C5-6, C6-7 and T1-2.  C2-3: Minimal broad-based disc bulge. No neural foraminal stenosis. No central canal stenosis.  C3-4: Broad-based disc bulge with a broad right paracentral disc protrusion contacting the ventral cervical spinal cord. Bilateral uncovertebral degenerative changes. Severe bilateral foraminal stenosis. Mild spinal stenosis.  C4-5: No significant disc bulge. Moderate right and mild left facet arthropathy. Moderate right foraminal stenosis. No left foraminal stenosis. No central canal stenosis.  C5-6: Broad-based disc bulge. Bilateral uncovertebral degenerative changes. Severe bilateral foraminal stenosis. Mild spinal stenosis.  C6-7: Broad-based disc  bulge. Bilateral uncovertebral degenerative changes. Severe bilateral foraminal stenosis. No central canal stenosis.  C7-T1: Broad-based disc bulge. Severe left facet arthropathy and left uncovertebral degenerative changes. Severe bilateral foraminal stenosis. Mild spinal stenosis.  T1-2: Mild broad-based disc bulge. Bilateral facet arthropathy. Mild right foraminal stenosis. No left foraminal stenosis.  IMPRESSION: 1. Diffuse cervical spine spondylosis as described above. 2.  No acute osseous injury of the cervical spine.   Electronically Signed   By: Elige KoHetal  Patel   On: 03/28/2018 15:15   He reports that he has been smoking cigarettes. He has never used smokeless tobacco. No results for input(s): HGBA1C, LABURIC in the last 8760 hours.  Objective:  VS:  HT:5\' 8"  (172.7 cm)   WT:200 lb (90.7 kg)  BMI:30.42    BP:104/64  HR:70bpm  TEMP: ( )  RESP:  Physical Exam Vitals and nursing note reviewed.  Constitutional:      General: He is not in acute distress.    Appearance: He is well-developed.  HENT:     Head: Normocephalic and atraumatic.     Nose: Nose  normal.     Mouth/Throat:     Mouth: Mucous membranes are moist.     Pharynx: Oropharynx is clear.  Eyes:     Conjunctiva/sclera: Conjunctivae normal.     Pupils: Pupils are equal, round, and reactive to light.  Neck:     Trachea: No tracheal deviation.  Cardiovascular:     Rate and Rhythm: Normal rate and regular rhythm.     Pulses: Normal pulses.  Pulmonary:     Effort: Pulmonary effort is normal.     Breath sounds: Normal breath sounds.  Abdominal:     General: There is no distension.     Palpations: Abdomen is soft.     Tenderness: There is no guarding or rebound.  Musculoskeletal:        General: No deformity.     Cervical back: Neck supple. Tenderness present. No rigidity.     Right lower leg: No edema.     Left lower leg: No edema.     Comments: Patient sits with forward flexed cervical spine and does have pain at end ranges of rotation bilaterally.  He has more pain with extension than forward flexion.  No specific trigger points noted but he does have tight musculature in the trapezius.  He has impingement sign bilaterally right more than left shoulder he has good strength in the hands bilaterally.  Lower back exam shows good distal strength he has no pain over the greater trochanters no pain with hip rotation.  He does have pain with extension and facet loading of the lumbar spine.  Lymphadenopathy:     Cervical: No cervical adenopathy.  Skin:    General: Skin is warm and dry.     Findings: No erythema or rash.  Neurological:     General: No focal deficit present.     Mental Status: He is alert and oriented to person, place, and time.     Motor: No abnormal muscle tone.     Coordination: Coordination normal.     Gait: Gait normal.  Psychiatric:        Mood and Affect: Mood normal.        Behavior: Behavior normal.        Thought Content: Thought content normal.     Ortho Exam Imaging: XR C-ARM NO REPORT  Result Date: 05/31/2019 Please see Notes tab for imaging  impression.   Past Medical/Family/Surgical/Social  History: Medications & Allergies reviewed per EMR, new medications updated. Patient Active Problem List   Diagnosis Date Noted  . Impingement syndrome of right shoulder 12/24/2016  . Chronic right shoulder pain 11/26/2016  . Neck pain 11/04/2016  . Chronic bilateral low back pain with bilateral sciatica 11/04/2016   History reviewed. No pertinent past medical history. History reviewed. No pertinent family history. History reviewed. No pertinent surgical history. Social History   Occupational History  . Not on file  Tobacco Use  . Smoking status: Current Every Day Smoker    Types: Cigarettes  . Smokeless tobacco: Never Used  Substance and Sexual Activity  . Alcohol use: Yes  . Drug use: No  . Sexual activity: Not on file

## 2019-06-07 ENCOUNTER — Telehealth: Payer: Self-pay | Admitting: *Deleted

## 2019-06-07 NOTE — Telephone Encounter (Signed)
Received approval via [facility] Reference# [id number] Effective dates: 06/01/19-08/30/19

## 2019-06-15 ENCOUNTER — Encounter: Payer: 59 | Admitting: Physical Medicine and Rehabilitation

## 2019-07-04 ENCOUNTER — Ambulatory Visit: Payer: Self-pay

## 2019-07-04 ENCOUNTER — Ambulatory Visit (INDEPENDENT_AMBULATORY_CARE_PROVIDER_SITE_OTHER): Payer: 59 | Admitting: Physical Medicine and Rehabilitation

## 2019-07-04 ENCOUNTER — Other Ambulatory Visit: Payer: Self-pay

## 2019-07-04 ENCOUNTER — Encounter: Payer: Self-pay | Admitting: Physical Medicine and Rehabilitation

## 2019-07-04 VITALS — BP 147/95 | HR 69

## 2019-07-04 DIAGNOSIS — M5010 Cervical disc disorder with radiculopathy, unspecified cervical region: Secondary | ICD-10-CM | POA: Diagnosis not present

## 2019-07-04 MED ORDER — METHYLPREDNISOLONE ACETATE 80 MG/ML IJ SUSP
40.0000 mg | Freq: Once | INTRAMUSCULAR | Status: AC
  Administered 2019-07-04: 40 mg

## 2019-07-04 NOTE — Progress Notes (Signed)
Jehiel Koepp - 64 y.o. male MRN [id number]  Date of birth: November 26, 1955  Office Visit Note: Visit Date: 07/04/2019 PCP: Irena Reichmann, DO Referred by: Irena Reichmann, DO  Subjective: Chief Complaint  Patient presents with  . Neck - Pain  . Right Shoulder - Pain  . Left Shoulder - Pain  . Right Arm - Pain  . Left Arm - Pain  . Right Hand - Tingling  . Left Hand - Tingling   HPI:  Quirino Kakos is a 64 y.o. male who comes in today For planned C7-T1 interlaminar epidural steroid injection for chronic worsening neck and bilateral shoulder arm pain with paresthesias.  Please see our prior notes for further details and justification.  Last time I saw the patient we actually ended up completing a lumbar injection which we have done on a few occasions.  His lumbar spine does show stenosis.  His cervical spine shows some spondylitic change without high-grade narrowing or nerve compression.  ROS Otherwise per HPI.  Assessment & Plan: Visit Diagnoses:  1. Cervical disc disorder with radiculopathy     Plan: No additional findings.   Meds & Orders:  Meds ordered this encounter  Medications  . methylPREDNISolone acetate (DEPO-MEDROL) injection 40 mg    Orders Placed This Encounter  Procedures  . XR C-ARM NO REPORT  . Epidural Steroid injection    Follow-up: Return if symptoms worsen or fail to improve.   Procedures: No procedures performed  Cervical Epidural Steroid Injection - Interlaminar Approach with Fluoroscopic Guidance  Patient: Deondre Marinaro      Date of Birth: Nov 05, 1955 MRN: [id number] PCP: Irena Reichmann, DO      Visit Date: 07/04/2019   Universal Protocol:    Date/Time: 07/04/2110:57 PM  Consent Given By: the patient  Position: PRONE  Additional Comments: Vital signs were monitored before and after the procedure. Patient was prepped and draped in the usual sterile fashion. The correct patient, procedure, and site was verified.   Injection Procedure  Details:  Procedure Site One Meds Administered:  Meds ordered this encounter  Medications  . methylPREDNISolone acetate (DEPO-MEDROL) injection 40 mg     Laterality: Right  Location/Site: C7-T1  Needle size: 20 G  Needle type: Touhy  Needle Placement: Paramedian epidural space  Findings:  -Comments: Excellent flow of contrast into the epidural space.  Procedure Details: Using a paramedian approach from the side mentioned above, the region overlying the inferior lamina was localized under fluoroscopic visualization and the soft tissues overlying this structure were infiltrated with 4 ml. of 1% Lidocaine without Epinephrine. A # 20 gauge, Tuohy needle was inserted into the epidural space using a paramedian approach.  The epidural space was localized using loss of resistance along with lateral and contralateral oblique bi-planar fluoroscopic views.  After negative aspirate for air, blood, and CSF, a 2 ml. volume of Isovue-250 was injected into the epidural space and the flow of contrast was observed. Radiographs were obtained for documentation purposes.   The injectate was administered into the level noted above.  Additional Comments:  The patient tolerated the procedure well Dressing: 2 x 2 sterile gauze and Band-Aid    Post-procedure details: Patient was observed during the procedure. Post-procedure instructions were reviewed.  Patient left the clinic in stable condition.    Clinical History: MRI LUMBAR SPINE WITHOUT CONTRAST    FINDINGS:  Segmentation: Standard lumbar numbering when compared to prior    Alignment: Retrolisthesis at L1-2 to L4-5, stable from prior and  greatest  at L2-3 where there is 3 mm of slip.    Vertebrae: New/progressed discogenic endplate edema at A6-3 at L2-3.    Conus medullaris and cauda equina: Conus extends to the L2 level.  Conus and cauda equina appear normal.    Paraspinal and other soft tissues: Negative    Disc levels:     L1-L2: Disc degeneration with endplate edema posteriorly on both  sides. Small upward migrating central disc extrusion without neural  contact.    L2-L3: Disc narrowing and bulge with endplate edema. Mild posterior  element hypertrophy. Moderate spinal stenosis. Best seen on axial  slices there is a left-sided herniation filling the left  subarticular recess and compressing the left L3 nerve root. Mild  left foraminal narrowing    L3-L4: Disc narrowing and bulge with mild posterior element  hypertrophy. Moderate spinal stenosis based on sagittal images.  Right subarticular recess narrowing impinging on the L4 nerve root.  A herniation was more discretely seen in this area on prior study.  The foramina are patent    L4-L5: Disc narrowing and bulge with endplate spurring. Mild  posterior element hypertrophy. No impingement    L5-S1:Disc narrowing with preferential right-sided height loss,  bulge, and endplate spur. Mild right facet spurring. Right foraminal  impingement. Patent canal and left foramen.    IMPRESSION:  1. Multilevel degenerative disease and retrolisthesis. Discogenic  endplate edema at K1-6 and L2-3 that has progressed from 2018.  2. L2-3 left paracentral herniation with L3 impingement in the  subarticular recess. Spinal stenosis is moderate at this level.  3. L3-4 moderate spinal stenosis. Continued right subarticular  recess stenosis and L4 impingement.  4. L5-S1 right foraminal impingement, progressed from 2018.      Electronically Signed  By: Monte Fantasia M.D.  On: 04/18/2018 09:42  ------- MRI CERVICAL SPINE WITHOUT CONTRAST  TECHNIQUE: Multiplanar, multisequence MR imaging of the cervical spine was performed. No intravenous contrast was administered.  COMPARISON:  None.  FINDINGS: Alignment: Physiologic.  Vertebrae: No fracture, evidence of discitis, or bone lesion.  Cord: Normal signal and morphology.  Posterior Fossa,  vertebral arteries, paraspinal tissues: Posterior fossa demonstrates no focal abnormality. Vertebral artery flow voids are maintained. Paraspinal soft tissues are unremarkable.  Disc levels:  Discs: Degenerative disc disease with disc height loss at C3-4, C5-6, C6-7 and T1-2.  C2-3: Minimal broad-based disc bulge. No neural foraminal stenosis. No central canal stenosis.  C3-4: Broad-based disc bulge with a broad right paracentral disc protrusion contacting the ventral cervical spinal cord. Bilateral uncovertebral degenerative changes. Severe bilateral foraminal stenosis. Mild spinal stenosis.  C4-5: No significant disc bulge. Moderate right and mild left facet arthropathy. Moderate right foraminal stenosis. No left foraminal stenosis. No central canal stenosis.  C5-6: Broad-based disc bulge. Bilateral uncovertebral degenerative changes. Severe bilateral foraminal stenosis. Mild spinal stenosis.  C6-7: Broad-based disc bulge. Bilateral uncovertebral degenerative changes. Severe bilateral foraminal stenosis. No central canal stenosis.  C7-T1: Broad-based disc bulge. Severe left facet arthropathy and left uncovertebral degenerative changes. Severe bilateral foraminal stenosis. Mild spinal stenosis.  T1-2: Mild broad-based disc bulge. Bilateral facet arthropathy. Mild right foraminal stenosis. No left foraminal stenosis.  IMPRESSION: 1. Diffuse cervical spine spondylosis as described above. 2.  No acute osseous injury of the cervical spine.   Electronically Signed   By: Kathreen Devoid   On: 03/28/2018 15:15     Objective:  VS:  HT:    WT:   BMI:     BP:(!) 147/95  HR:69bpm  TEMP: ( )  RESP:  Physical Exam  Ortho Exam Imaging: XR C-ARM NO REPORT  Result Date: 07/04/2019 Please see Notes tab for imaging impression.

## 2019-07-04 NOTE — Procedures (Signed)
Cervical Epidural Steroid Injection - Interlaminar Approach with Fluoroscopic Guidance  Patient: [name]      Date of Birth: 06-19-1955 MRN: [id number] PCP: Irena Reichmann, DO      Visit Date: 07/04/2019   Universal Protocol:    Date/Time: 07/04/2110:57 PM  Consent Given By: the patient  Position: PRONE  Additional Comments: Vital signs were monitored before and after the procedure. Patient was prepped and draped in the usual sterile fashion. The correct patient, procedure, and site was verified.   Injection Procedure Details:  Procedure Site One Meds Administered:  Meds ordered this encounter  Medications  . methylPREDNISolone acetate (DEPO-MEDROL) injection 40 mg     Laterality: Right  Location/Site: C7-T1  Needle size: 20 G  Needle type: Touhy  Needle Placement: Paramedian epidural space  Findings:  -Comments: Excellent flow of contrast into the epidural space.  Procedure Details: Using a paramedian approach from the side mentioned above, the region overlying the inferior lamina was localized under fluoroscopic visualization and the soft tissues overlying this structure were infiltrated with 4 ml. of 1% Lidocaine without Epinephrine. A # 20 gauge, Tuohy needle was inserted into the epidural space using a paramedian approach.  The epidural space was localized using loss of resistance along with lateral and contralateral oblique bi-planar fluoroscopic views.  After negative aspirate for air, blood, and CSF, a 2 ml. volume of Isovue-250 was injected into the epidural space and the flow of contrast was observed. Radiographs were obtained for documentation purposes.   The injectate was administered into the level noted above.  Additional Comments:  The patient tolerated the procedure well Dressing: 2 x 2 sterile gauze and Band-Aid    Post-procedure details: Patient was observed during the procedure. Post-procedure instructions were reviewed.  Patient left  the clinic in stable condition.

## 2019-07-04 NOTE — Progress Notes (Signed)
.  Numeric Pain Rating Scale and Functional Assessment Average Pain 7   In the last MONTH (on 0-10 scale) has pain interfered with the following?  1. General activity like being  able to carry out your everyday physical activities such as walking, climbing stairs, carrying groceries, or moving a chair?  Rating(8)   +Driver, -BT, -Dye Allergies.  

## 2019-11-11 ENCOUNTER — Telehealth: Payer: Self-pay | Admitting: Orthopaedic Surgery

## 2019-11-11 NOTE — Telephone Encounter (Signed)
Patient's wife Rivka Barbara called advised patient's handicap placard just ran out 3 days ago and he will need another one. Patient's wife said she will pick up placard. The number to contact Rivka Barbara is [phone]

## 2019-11-15 NOTE — Telephone Encounter (Signed)
Patient aware handicap at front desk  

## 2020-07-05 ENCOUNTER — Telehealth: Payer: Self-pay | Admitting: Orthopaedic Surgery

## 2020-07-05 NOTE — Telephone Encounter (Signed)
Patient's wife Rivka Barbara submitted on behalf of husband medical release form, short term disability, and $25.00 check psyment to [employer]. Accepcted 07/04/20

## 2020-07-16 ENCOUNTER — Ambulatory Visit (INDEPENDENT_AMBULATORY_CARE_PROVIDER_SITE_OTHER): Payer: 59 | Admitting: Orthopaedic Surgery

## 2020-07-16 ENCOUNTER — Other Ambulatory Visit: Payer: Self-pay

## 2020-07-16 ENCOUNTER — Ambulatory Visit: Payer: Self-pay

## 2020-07-16 DIAGNOSIS — M5442 Lumbago with sciatica, left side: Secondary | ICD-10-CM

## 2020-07-16 DIAGNOSIS — G8929 Other chronic pain: Secondary | ICD-10-CM

## 2020-07-16 DIAGNOSIS — M5441 Lumbago with sciatica, right side: Secondary | ICD-10-CM

## 2020-07-16 MED ORDER — METHYLPREDNISOLONE 4 MG PO TABS: ORAL_TABLET | ORAL | 0 refills | Status: DC

## 2020-07-16 MED ORDER — TIZANIDINE HCL 4 MG PO TABS: 4.0000 mg | ORAL_TABLET | Freq: Three times a day (TID) | ORAL | 0 refills | Status: DC | PRN

## 2020-07-16 MED ORDER — TRAMADOL HCL 50 MG PO TABS: 50.0000 mg | ORAL_TABLET | Freq: Four times a day (QID) | ORAL | 0 refills | Status: DC | PRN

## 2020-07-16 NOTE — Progress Notes (Signed)
   Office Visit Note   Patient: [name]           Date of Birth: [birth year]           MRN: [id number] Visit Date: 07/16/2020              Requested by: Irena Reichmann, DO [address] [place],  [place] [address] PCP: Irena Reichmann, DO   Assessment & Plan: Visit Diagnoses:  1. Chronic bilateral low back pain with bilateral sciatica     Plan: I will put him on a 6-day steroid taper as well as a muscle relaxant and some tramadol to use sparingly.  He will continue anti-inflammatory patches that are over-the-counter as needed for his low back.  He is interested in another epidural steroid injection so we will see about getting this set up with Dr. Alvester Morin to the same right side that was done back in March 2021.  All questions and concerns were answered and addressed.  Follow-Up Instructions: No follow-ups on file.   Orders:  Orders Placed This Encounter  Procedures  . XR Lumbar Spine 2-3 Views   No orders of the defined types were placed in this encounter.     Procedures: No procedures performed   Clinical Data: No additional findings.   Subjective: Chief Complaint  Patient presents with  . Lower Back - Pain  The patient comes today with acute flareup of chronic issue involving his lumbar spine to the right side.  He last had an epidural steroid injection to the right side at either L2 or L3 or L3-L4 by Dr. Alvester Morin back in March 2021.  He had done well until more recently.  He has developed pain in the right side of his low back again.  It only radiates into the sciatic region and not down his leg.  He denies any groin pain bilaterally.  He has had no other acute change in medical status.  He and his wife are leaving for a trip to [place] tomorrow.  He is not needing an assistive device to walk.  HPI  Review of Systems He currently denies any headache, chest pain, shortness of breath, fever, chills, nausea, vomiting  Objective: Vital Signs: There were no  vitals taken for this visit.  Physical Exam He is alert and orient x3 in no acute distress Ortho Exam Examination of both hips show the move smoothly and fluidly.  He gets out of the chair easily.  He points to the low back to the right side is source of his pain and it does radiate into the sciatic region.  He has great strength in his bilateral extremities. Specialty Comments:  No specialty comments available.  Imaging: No results found.   PMFS History: Patient Active Problem List   Diagnosis Date Noted  . Impingement syndrome of right shoulder 12/24/2016  . Chronic right shoulder pain 11/26/2016  . Neck pain 11/04/2016  . Chronic bilateral low back pain with bilateral sciatica 11/04/2016   No past medical history on file.  No family history on file.  No past surgical history on file. Social History   Occupational History  . Not on file  Tobacco Use  . Smoking status: Current Every Day Smoker    Types: Cigarettes  . Smokeless tobacco: Never Used  Substance and Sexual Activity  . Alcohol use: Yes  . Drug use: No  . Sexual activity: Not on file

## 2020-07-23 ENCOUNTER — Telehealth: Payer: Self-pay

## 2020-07-23 NOTE — Telephone Encounter (Signed)
[name] from [facility] called he stated the gel injection has been denied but the patient can do peer to peer that include other options such as reconsideration call [phone] option 4  Ref:[id number]

## 2020-07-24 NOTE — Telephone Encounter (Signed)
I believe this was for an injection with Dr. Alvester Morin, not a gel injection.

## 2020-07-26 NOTE — Telephone Encounter (Signed)
This patient has a referral in for Right L3-4 interlam which says it is pending. I think that might be what was denied, but we did not receive faxes for a few days.

## 2020-07-26 NOTE — Telephone Encounter (Signed)
Please Advise

## 2020-07-30 ENCOUNTER — Telehealth: Payer: Self-pay

## 2020-07-30 NOTE — Telephone Encounter (Signed)
Wife aware Rx at front desk

## 2020-07-30 NOTE — Telephone Encounter (Signed)
Patients wife Rivka Barbara called regarding a rx for a scooter she is requesting a call back:[phone]

## 2020-07-30 NOTE — Telephone Encounter (Signed)
I am fine with him having a basic scooter.

## 2020-07-31 ENCOUNTER — Other Ambulatory Visit: Payer: Self-pay | Admitting: Orthopaedic Surgery

## 2020-07-31 NOTE — Telephone Encounter (Signed)
PENDING

## 2020-08-01 ENCOUNTER — Telehealth: Payer: Self-pay | Admitting: Orthopaedic Surgery

## 2020-08-01 NOTE — Telephone Encounter (Signed)
Pts wife Rivka Barbara called stating a rx was written for a scooter but now insurance is saying the pt will need an pre autho in order for them to cover it. Pt would like to have this sent in and then would like to be updated when we've done this please.   [phone]

## 2020-08-01 NOTE — Telephone Encounter (Signed)
I called pt's wife and she stated she is trying to get insurance to pay back for scooter she purchased. I instructed the pt to try to get scooter with [facility] and try to return other scooter. She stated understanding

## 2020-08-13 ENCOUNTER — Telehealth: Payer: Self-pay | Admitting: Orthopaedic Surgery

## 2020-08-13 NOTE — Telephone Encounter (Signed)
Patient's wife Rivka Barbara called advised the referral to see Dr. Alvester Morin was denied. Rivka Barbara said the insurance said the insurance denied the referral because patient did not get  a physical. The number to contact Rivka Barbara is [phone]

## 2020-08-13 NOTE — Telephone Encounter (Signed)
Please advise. This correct?

## 2020-08-13 NOTE — Telephone Encounter (Signed)
Pt has an referral from Dr August Saucer and I sent over the denial paper the [facility]. I haven't gotten any updates.

## 2020-08-14 NOTE — Telephone Encounter (Signed)
Sent MyChart message to patient

## 2020-08-14 NOTE — Telephone Encounter (Signed)
The only options at this point or either him trying some more physical therapy as an outpatient for his spine versus referral to Dr. Ophelia Charter for a second opinion as it relates to these issues.

## 2020-08-27 ENCOUNTER — Encounter: Payer: Self-pay | Admitting: Orthopaedic Surgery

## 2020-08-27 ENCOUNTER — Ambulatory Visit (INDEPENDENT_AMBULATORY_CARE_PROVIDER_SITE_OTHER): Payer: 59 | Admitting: Orthopaedic Surgery

## 2020-08-27 VITALS — BP 147/83 | HR 68 | Ht 68.5 in | Wt 210.0 lb

## 2020-08-27 DIAGNOSIS — M48061 Spinal stenosis, lumbar region without neurogenic claudication: Secondary | ICD-10-CM | POA: Insufficient documentation

## 2020-08-27 DIAGNOSIS — G8929 Other chronic pain: Secondary | ICD-10-CM | POA: Diagnosis not present

## 2020-08-27 DIAGNOSIS — M48062 Spinal stenosis, lumbar region with neurogenic claudication: Secondary | ICD-10-CM

## 2020-08-27 DIAGNOSIS — M5441 Lumbago with sciatica, right side: Secondary | ICD-10-CM | POA: Diagnosis not present

## 2020-08-27 DIAGNOSIS — M5442 Lumbago with sciatica, left side: Secondary | ICD-10-CM

## 2020-08-27 NOTE — Progress Notes (Signed)
Office Visit Note   Patient: [name]           Date of Birth: 1955/03/11           MRN: [id number] Visit Date: 08/27/2020              Requested by: Irena Reichmann, DO [address] [place],  [place] [address] PCP: Irena Reichmann, DO   Assessment & Plan: Visit Diagnoses:  1. Chronic bilateral low back pain with bilateral sciatica   2. Spinal stenosis of lumbar region with neurogenic claudication     Plan: Patient has progressive stenosis symptoms that resolved with sitting or supine position worse with standing or walking.  He gets some relief leaning on a grocery cart.  Past history of multiple epidurals in the past.  No imaging studies done in the last 2 years.  Would recommend proceeding with a lumbar MRI scan to rule out progressive lumbar stenosis at the L3-4 level.  Office after scan for review.  Follow-Up Instructions: return after lumbar MRI scan  Orders:  Orders Placed This Encounter  Procedures   MR Lumbar Spine w/o contrast   No orders of the defined types were placed in this encounter.     Procedures: No procedures performed   Clinical Data: No additional findings.   Subjective: Chief Complaint  Patient presents with   Lower Back - Pain    Referral from Dr.Blackman    HPI 65 year old male states his low back has been hurting for years.  He is currently not working states he is also scheduled disability.  He has had increased pain radiating posteriorly into the knee right and left.  States he always has some numbness no previous back surgeries.  Has had cortisone injections in the past.  He states swimming helps.  If he walks more he has increased pain.  Does better leaning forward. He also has cervical spondylosis in his neck multilevel has had previous cervical injections which gave him some relief.  He has had cervical CT scans with myelogram in the year 2000, MRI scan cervical spine 2002 also 2020.  2020 images showed diffuse cervical  spondylosis, multilevel.  Patient states his lower back is worse than his neck since he is not able to walk.  Lumbar MRI scan 04/18/2018 showed some retrolisthesis at L1-2 and L4-5.  3 mm of listhesis at the L2-3 level.  Progressive endplate degenerative changes noted in the lumbar spine.  Moderate stenosis at L3-4.  Left paracentral herniation at L2-3 and right subarticular recess stenosis at L3-4.  Patient states his symptoms of gotten worse. Review of Systems positive for chronic neck and back pain.  All other systems noncontributory to HPI.  Patient continues to smoke.   Objective: Vital Signs: BP (!) 147/83   Pulse 68   Ht 5' 8.5" (1.74 m)   Wt 210 lb (95.3 kg)   BMI 31.47 kg/m   Physical Exam Constitutional:      Appearance: He is well-developed.  HENT:     Head: Normocephalic and atraumatic.     Right Ear: External ear normal.     Left Ear: External ear normal.  Eyes:     Pupils: Pupils are equal, round, and reactive to light.  Neck:     Thyroid: No thyromegaly.     Trachea: No tracheal deviation.  Cardiovascular:     Rate and Rhythm: Normal rate.  Pulmonary:     Effort: Pulmonary effort is normal.  Breath sounds: No wheezing.  Abdominal:     General: Bowel sounds are normal.     Palpations: Abdomen is soft.  Musculoskeletal:     Cervical back: Neck supple.  Skin:    General: Skin is warm and dry.     Capillary Refill: Capillary refill takes less than 2 seconds.  Neurological:     Mental Status: He is alert and oriented to person, place, and time.  Psychiatric:        Behavior: Behavior normal.        Thought Content: Thought content normal.        Judgment: Judgment normal.    Ortho Exam patient slow getting from sitting standing.  He ambulates with a left knee limp does not quite get his left knee to full extension.  Anterior tib are strong no foot drop.  No gastrocsoleus atrophy.  Pedal pulses are palpable.  EHL anterior tib is active.  Specialty  Comments:  No specialty comments available.  Imaging: No results found.   PMFS History: Patient Active Problem List   Diagnosis Date Noted   Spinal stenosis of lumbar region 08/27/2020   Impingement syndrome of right shoulder 12/24/2016   Chronic right shoulder pain 11/26/2016   Neck pain 11/04/2016   Chronic bilateral low back pain with bilateral sciatica 11/04/2016   History reviewed. No pertinent past medical history.  History reviewed. No pertinent family history.  History reviewed. No pertinent surgical history. Social History   Occupational History   Not on file  Tobacco Use   Smoking status: Every Day    Pack years: 0.00    Types: Cigarettes   Smokeless tobacco: Never  Substance and Sexual Activity   Alcohol use: Yes   Drug use: No   Sexual activity: Not on file

## 2020-09-02 ENCOUNTER — Other Ambulatory Visit: Payer: Self-pay

## 2020-09-02 ENCOUNTER — Ambulatory Visit
Admission: RE | Admit: 2020-09-02 | Discharge: 2020-09-02 | Disposition: A | Discharge status: Home / Self Care | Payer: 59 | Source: Ambulatory Visit | Attending: Orthopaedic Surgery | Admitting: Orthopaedic Surgery

## 2020-09-02 DIAGNOSIS — G8929 Other chronic pain: Secondary | ICD-10-CM

## 2020-09-04 ENCOUNTER — Ambulatory Visit: Payer: 59 | Admitting: Orthopaedic Surgery

## 2020-09-14 NOTE — Telephone Encounter (Signed)
Dr Ophelia Charter seeing patient next week to review MRI.  If Dr Ophelia Charter wants patient to proceed with ESI, Tamela Oddi will enter new order.

## 2020-09-18 ENCOUNTER — Telehealth: Payer: Self-pay | Admitting: Orthopaedic Surgery

## 2020-09-18 ENCOUNTER — Ambulatory Visit (INDEPENDENT_AMBULATORY_CARE_PROVIDER_SITE_OTHER): Payer: 59 | Admitting: Orthopaedic Surgery

## 2020-09-18 ENCOUNTER — Encounter: Payer: Self-pay | Admitting: Orthopaedic Surgery

## 2020-09-18 VITALS — BP 118/76 | HR 79 | Ht 69.0 in | Wt 192.4 lb

## 2020-09-18 DIAGNOSIS — M48062 Spinal stenosis, lumbar region with neurogenic claudication: Secondary | ICD-10-CM

## 2020-09-18 NOTE — Telephone Encounter (Signed)
[facility] called about the processed reconsideration request and it was denied for this pt.  She said to file an appeal through the web site.   CB [phone]

## 2020-09-18 NOTE — Telephone Encounter (Signed)
FYI.  Dr. Ophelia Charter saw patient this morning and is scheduling for surgery.

## 2020-09-18 NOTE — Progress Notes (Signed)
Office Visit Note   Patient: [name]           Date of Birth: 1955-05-28           MRN: [id number] Visit Date: 09/18/2020              Requested by: Irena Reichmann, DO [address] [place],  [place] [address] PCP: Irena Reichmann, DO   Assessment & Plan: Visit Diagnoses:  1. Spinal stenosis of lumbar region with neurogenic claudication     Plan: We reviewed MRI scan with patient and his spouse.  Patient has severe stenosis to level at L2-3 and L3-4.  Patient has significant compression from MRI 10/31/2016 which was also reviewed today.  He has progressed from moderate to now severe stenosis.  We discussed options which would be a two-level lumbar decompression.  He states he like to think about it and will call if he like to proceed with surgical scheduling.  We discussed overnight stay operative technique risk surgery including potential for recurrence or development of later instability.  Possible need for fusion.  Risk of dural tear.  Risks associated with general anesthesia.  He would need medical clearance preoperatively.  Questions were elicited and answered and operative technique discussed in detail.  He understands and will request to proceed.  Follow-Up Instructions: No follow-ups on file.   Orders:  No orders of the defined types were placed in this encounter.  No orders of the defined types were placed in this encounter.     Procedures: No procedures performed   Clinical Data: No additional findings.   Subjective: Chief Complaint  Patient presents with   Lower Back - Follow-up, Pain    MRI lumbar review    HPI 65 year old male returns with two-level lumbar stenosis and neurogenic claudication symptoms.  Patient got temporary relief with prednisone and then recurrence about a week after he finished the Dosepak.  He has used ibuprofen and Robaxin without relief.  Lumbar MRI 09/03/2020 showed severe stenosis at L2-3 and L3-4 with degenerative  endplate changes and significant endplate edema at those 2 levels.  There is some mild changes at other levels but no compression.  He has more foraminal compression with stenosis on the right at L3-4 and on the left at L2-3.  Patient states he can only walk 1/16 of a mile and cannot make it halfway around the track.  He has to stop and sit after 5 minutes he gets relief.  He had significant pain and inability to get around so he got a motorized scooter while he was in [place] and brought it back to [place].  He cannot go through the store unless he leans on a grocery cart or uses a motorized scooter.  Patient has been through therapy in the past.  He has also been through epidurals, both cervical and lumbar.  Cervical MRI showed some spondylosis but no areas of significant compression.  He has no myelopathic symptoms.  Review of Systems 14 point system update positive smoking with chronic back pain with claudication symptoms.   Objective: Vital Signs: BP 118/76   Pulse 79   Ht 5\' 9"  (1.753 m)   Wt 192 lb 6.4 oz (87.3 kg)   BMI 28.41 kg/m   Physical Exam Constitutional:      Appearance: He is well-developed.  HENT:     Head: Normocephalic and atraumatic.     Right Ear: External ear normal.     Left Ear: External  ear normal.  Eyes:     Pupils: Pupils are equal, round, and reactive to light.  Neck:     Thyroid: No thyromegaly.     Trachea: No tracheal deviation.  Cardiovascular:     Rate and Rhythm: Normal rate.  Pulmonary:     Effort: Pulmonary effort is normal.     Breath sounds: No wheezing.  Abdominal:     General: Bowel sounds are normal.     Palpations: Abdomen is soft.  Musculoskeletal:     Cervical back: Neck supple.  Skin:    General: Skin is warm and dry.     Capillary Refill: Capillary refill takes less than 2 seconds.  Neurological:     Mental Status: He is alert and oriented to person, place, and time.  Psychiatric:        Behavior: Behavior normal.         Thought Content: Thought content normal.        Judgment: Judgment normal.    Ortho Exam patient has negative straight leg raising anterior tib EHL is active.  No foot drop no atrophy.  Pedal pulses are palpable right and left.  Specialty Comments:  No specialty comments available.  Imaging: CLINICAL DATA:  Low back pain for over 6 weeks radiating down the left side the left leg   EXAM: MRI LUMBAR SPINE WITHOUT CONTRAST   TECHNIQUE: Multiplanar, multisequence MR imaging of the lumbar spine was performed. No intravenous contrast was administered.   COMPARISON:  04/18/2018   FINDINGS: Segmentation: 5 lumbar type vertebrae based on the available coverage   Alignment:  A few mm of retrolisthesis is seen at L1-2 to L4-5.   Vertebrae: Discogenic endplate edema at G3-8 and L3-4, notably progressed. No fracture or focal bone lesion.   Conus medullaris and cauda equina: Conus extends to the L1-2 level. Conus appears normal. There is cauda equina redundancy due to the degree of severe spinal stenosis.   Paraspinal and other soft tissues: Negative   Disc levels:   T12- L1: Unremarkable.   L1-L2: Disc narrowing and desiccation with bulging and ridging. Negative facets. The canal and foramina are patent   L2-L3: Disc collapse and endplate degeneration with bulging and ridging asymmetric to the left. Advanced spinal and left foraminal stenosis, progressed   L3-L4: Disc collapse and endplate degeneration with disc bulging and ridging. Advanced spinal and right foraminal stenosis, progressed.   L4-L5: Disc narrowing and bulging with endplate ridging. Mild facet spurring   L5-S1:Disc narrowing and bulging with central protrusion and annular fissure. Degenerative facet spurring on the right more than left. Chronic right foraminal impingement.   IMPRESSION: 1. Advanced lumbar spine degeneration with multilevel listhesis and progression from 2020. Notable discogenic marrow  edema at L2-3 and L3-4. 2. L2-3 advanced spinal and left foraminal stenosis. 3. L3-4 advanced spinal and right foraminal stenosis. 4. L5-S1 chronic right foraminal impingement.     Electronically Signed   By: Marnee Spring M.D.   On: 09/03/2020 04:25     PMFS History: Patient Active Problem List   Diagnosis Date Noted   Spinal stenosis of lumbar region 08/27/2020   Impingement syndrome of right shoulder 12/24/2016   Chronic right shoulder pain 11/26/2016   Neck pain 11/04/2016   Chronic bilateral low back pain with bilateral sciatica 11/04/2016   No past medical history on file.  No family history on file.  No past surgical history on file. Social History   Occupational History   Not on  file  Tobacco Use   Smoking status: Every Day    Pack years: 0.00    Types: Cigarettes   Smokeless tobacco: Never  Substance and Sexual Activity   Alcohol use: Yes   Drug use: No   Sexual activity: Not on file

## 2020-09-21 ENCOUNTER — Telehealth: Payer: Self-pay

## 2020-09-21 NOTE — Telephone Encounter (Signed)
Patient has been out of work for years Since 2019

## 2020-10-06 ENCOUNTER — Other Ambulatory Visit: Payer: Self-pay | Admitting: Orthopaedic Surgery

## 2020-10-26 ENCOUNTER — Other Ambulatory Visit: Payer: Self-pay | Admitting: Family Medicine

## 2020-11-29 ENCOUNTER — Other Ambulatory Visit: Payer: Self-pay | Admitting: Specialist

## 2020-11-29 MED ORDER — TIZANIDINE HCL 4 MG PO TABS: ORAL_TABLET | ORAL | 1 refills | Status: DC

## 2020-12-03 ENCOUNTER — Telehealth: Payer: Self-pay | Admitting: Radiology

## 2020-12-03 MED ORDER — TIZANIDINE HCL 4 MG PO TABS: ORAL_TABLET | ORAL | 1 refills | Status: DC

## 2020-12-03 NOTE — Telephone Encounter (Signed)
Refilled

## 2020-12-03 NOTE — Telephone Encounter (Signed)
Received fax from pharmacy requesting refill on Tizanidine 4mg  tablet- 1 po q 8 hours prn spasms #90- Dr. . OK to refill?

## 2020-12-03 NOTE — Addendum Note (Signed)
Addended by: Rogers Seeds on: 12/03/2020 03:39 PM   Modules accepted: Orders

## 2021-06-17 ENCOUNTER — Emergency Department (HOSPITAL_COMMUNITY)
Admission: EM | Admit: 2021-06-17 | Discharge: 2021-06-17 | Disposition: A | Discharge status: Home / Self Care | Payer: Medicare Other | Attending: Emergency Medicine | Admitting: Emergency Medicine

## 2021-06-17 ENCOUNTER — Emergency Department (HOSPITAL_COMMUNITY): Payer: Medicare Other

## 2021-06-17 ENCOUNTER — Encounter (HOSPITAL_COMMUNITY): Payer: Self-pay | Admitting: Emergency Medicine

## 2021-06-17 ENCOUNTER — Other Ambulatory Visit: Payer: Self-pay

## 2021-06-17 DIAGNOSIS — K279 Peptic ulcer, site unspecified, unspecified as acute or chronic, without hemorrhage or perforation: Secondary | ICD-10-CM | POA: Insufficient documentation

## 2021-06-17 DIAGNOSIS — G8929 Other chronic pain: Secondary | ICD-10-CM | POA: Insufficient documentation

## 2021-06-17 DIAGNOSIS — R1013 Epigastric pain: Secondary | ICD-10-CM

## 2021-06-17 DIAGNOSIS — F17200 Nicotine dependence, unspecified, uncomplicated: Secondary | ICD-10-CM | POA: Diagnosis not present

## 2021-06-17 DIAGNOSIS — R079 Chest pain, unspecified: Secondary | ICD-10-CM | POA: Diagnosis not present

## 2021-06-17 DIAGNOSIS — R748 Abnormal levels of other serum enzymes: Secondary | ICD-10-CM | POA: Insufficient documentation

## 2021-06-17 DIAGNOSIS — Z7982 Long term (current) use of aspirin: Secondary | ICD-10-CM | POA: Diagnosis not present

## 2021-06-17 DIAGNOSIS — D72829 Elevated white blood cell count, unspecified: Secondary | ICD-10-CM | POA: Insufficient documentation

## 2021-06-17 DIAGNOSIS — R1084 Generalized abdominal pain: Secondary | ICD-10-CM | POA: Diagnosis present

## 2021-06-17 LAB — COMPREHENSIVE METABOLIC PANEL
ALT: 19 U/L (ref 0–44)
AST: 20 U/L (ref 15–41)
Albumin: 3.4 g/dL — ABNORMAL LOW (ref 3.5–5.0)
Alkaline Phosphatase: 95 U/L (ref 38–126)
Anion gap: 8 (ref 5–15)
BUN: 19 mg/dL (ref 8–23)
CO2: 27 mmol/L (ref 22–32)
Calcium: 9.1 mg/dL (ref 8.9–10.3)
Chloride: 102 mmol/L (ref 98–111)
Creatinine, Ser: 0.86 mg/dL (ref 0.61–1.24)
GFR, Estimated: >60 mL/min (ref >60)
Glucose, Bld: 141 mg/dL — ABNORMAL HIGH (ref 70–99)
Potassium: 4 mmol/L (ref 3.5–5.1)
Sodium: 137 mmol/L (ref 135–145)
Total Bilirubin: 0.5 mg/dL (ref 0.3–1.2)
Total Protein: 7.6 g/dL (ref 6.5–8.1)

## 2021-06-17 LAB — URINALYSIS, ROUTINE W REFLEX MICROSCOPIC
Bilirubin Urine: NEGATIVE
Glucose, UA: NEGATIVE mg/dL
Hgb urine dipstick: NEGATIVE
Ketones, ur: 5 mg/dL — AB
Leukocytes,Ua: NEGATIVE
Nitrite: NEGATIVE
Protein, ur: 100 mg/dL — AB
Specific Gravity, Urine: 1.028 (ref 1.005–1.030)
pH: 6 (ref 5.0–8.0)

## 2021-06-17 LAB — CBC
HCT: 41.1 % (ref 39.0–52.0)
Hemoglobin: 13.3 g/dL (ref 13.0–17.0)
MCH: 28.7 pg (ref 26.0–34.0)
MCHC: 32.4 g/dL (ref 30.0–36.0)
MCV: 88.8 fL (ref 80.0–100.0)
Platelets: 521 10*3/uL — ABNORMAL HIGH (ref 150–400)
RBC: 4.63 MIL/uL (ref 4.22–5.81)
RDW: 15.5 % (ref 11.5–15.5)
WBC: 13.7 10*3/uL — ABNORMAL HIGH (ref 4.0–10.5)
nRBC: 0 % (ref 0.0–0.2)

## 2021-06-17 LAB — TROPONIN I (HIGH SENSITIVITY)
Troponin I (High Sensitivity): 22 ng/L — ABNORMAL HIGH (ref <18)
Troponin I (High Sensitivity): 20 ng/L — ABNORMAL HIGH (ref <18)

## 2021-06-17 LAB — MAGNESIUM: Magnesium: 1.7 mg/dL (ref 1.7–2.4)

## 2021-06-17 LAB — LIPASE, BLOOD: Lipase: 39 U/L (ref 11–51)

## 2021-06-17 MED ORDER — LACTATED RINGERS IV BOLUS
1000.0000 mL | Freq: Once | INTRAVENOUS | Status: AC
  Administered 2021-06-17: 1000 mL via INTRAVENOUS

## 2021-06-17 MED ORDER — MORPHINE SULFATE (PF) 4 MG/ML IV SOLN
4.0000 mg | Freq: Once | INTRAVENOUS | Status: AC
Start: 2021-06-17 — End: 2021-06-17
  Administered 2021-06-17: 4 mg via INTRAVENOUS
  Filled 2021-06-17: qty 1

## 2021-06-17 MED ORDER — IOHEXOL 350 MG/ML SOLN
100.0000 mL | Freq: Once | INTRAVENOUS | Status: AC | PRN
  Administered 2021-06-17: 100 mL via INTRAVENOUS

## 2021-06-17 MED ORDER — NITROGLYCERIN 2 % TD OINT
1.0000 [in_us] | TOPICAL_OINTMENT | Freq: Once | TRANSDERMAL | Status: AC
  Administered 2021-06-17: 1 [in_us] via TOPICAL
  Filled 2021-06-17: qty 1

## 2021-06-17 MED ORDER — ONDANSETRON HCL 4 MG/2ML IJ SOLN
4.0000 mg | Freq: Once | INTRAMUSCULAR | Status: AC
  Administered 2021-06-17: 4 mg via INTRAVENOUS
  Filled 2021-06-17: qty 2

## 2021-06-17 MED ORDER — ASPIRIN 81 MG PO CHEW
324.0000 mg | CHEWABLE_TABLET | Freq: Once | ORAL | Status: AC
  Administered 2021-06-17: 324 mg via ORAL
  Filled 2021-06-17: qty 4

## 2021-06-17 MED ORDER — ALUM & MAG HYDROXIDE-SIMETH 200-200-20 MG/5ML PO SUSP
30.0000 mL | Freq: Once | ORAL | Status: AC
  Administered 2021-06-17: 30 mL via ORAL
  Filled 2021-06-17: qty 30

## 2021-06-17 MED ORDER — SUCRALFATE 1 G PO TABS: 1.0000 g | ORAL_TABLET | Freq: Three times a day (TID) | ORAL | 0 refills | Status: DC

## 2021-06-17 MED ORDER — LIDOCAINE VISCOUS HCL 2 % MT SOLN
15.0000 mL | Freq: Once | OROMUCOSAL | Status: AC
Start: 2021-06-17 — End: 2021-06-17
  Administered 2021-06-17: 15 mL via ORAL
  Filled 2021-06-17: qty 15

## 2021-06-17 MED ORDER — FAMOTIDINE IN NACL 20-0.9 MG/50ML-% IV SOLN
20.0000 mg | Freq: Once | INTRAVENOUS | Status: AC
  Administered 2021-06-17: 20 mg via INTRAVENOUS
  Filled 2021-06-17: qty 50

## 2021-06-17 NOTE — ED Provider Notes (Signed)
?  Physical Exam  ?BP (!) 163/97   Pulse 76   Temp 98.5 ?F (36.9 ?C) (Oral)   Resp (!) 25   SpO2 96%  ? ?Physical Exam ? ?Procedures  ?Procedures ? ?ED Course / MDM  ?  ?Medical Decision Making ?Amount and/or Complexity of Data Reviewed ?Labs: ordered. ?Radiology: ordered. ? ?Risk ?OTC drugs. ?Prescription drug management. ? ? ?Assuming care of patient from Dr. Durwin Nora ?  ?Patient in the ED for epigastric chest pain ?Workup thus far shows slightly elevated trops. ?I went and spoke with the patient.  He indicates that he started having epigastric abdominal pain that was radiating down his abdomen earlier today.  He had associated nausea, sweats.  He had no point had thoracic chest pain.  ? ?He does smoke 1 pack a day, Has hyperlipidemia and has been prescribed Protonix, which she has been taking for the last 2 weeks. ? ?He was given GI cocktail by Dr. Durwin Nora.  The plan was that if patient's symptoms resolved with GI cocktail, then he can be discharged.  If the pain persist, then we should consider admission ? ?Patient indicates that his pain has resolved after GI cocktail. ? ?We will give patient both cardiology and GI follow-up.  Clinically, it appears that he likely has peptic ulcer disease as the cause.  He will call the GI team for a follow-up.  We will add Carafate to his regimen ? ?He does have some coronary artery disease risk factors and his CT angio dissection study did show some coronary calcification.  Given this information and slightly elevated troponins that were flat, we will also give him cardiology follow-up. ? ?I did discuss the case with cardiology service.  They do not think patient needs admission to the hospital for ACS work-up, they recommend admission for medical work-up.  I do not think he needs further medical work-up during this stay, therefore we will give him outpatient cardiology follow-up with strict ER return precautions. ? ?Patient is comfortable with this plan.. ?  ?Derwood Kaplan,  MD ?06/17/21 1745 ? ?

## 2021-06-17 NOTE — Discharge Instructions (Signed)
We saw you in the ER for the chest pain/shortness of breath. ?All of our cardiac workup is normal, including labs, EKG and chest X-RAY are normal. ?We are not sure what is causing your discomfort, but we feel comfortable sending you home at this time. The workup in the ER is not complete, and you should follow up with your primary care doctor for further evaluation.  ? ?We have also given you follow-up information for both cardiology and GI group.  Please call them for an earliest appointment available. ?Please return to the ER if you have worsening chest pain, shortness of breath, pain radiating to your jaw, shoulder, or back, sweats or fainting. Otherwise see the Cardiologist or your primary care doctor as requested. ? ?

## 2021-06-17 NOTE — ED Triage Notes (Signed)
Patient coming from home complaint of abdominal pain/chest pain ongoing since yesterday. States had same 1 month ago.  ?

## 2021-06-17 NOTE — ED Provider Notes (Signed)
?[facility] EMERGENCY DEPARTMENT ?Provider Note ? ? ?CSN: [id number] ?Arrival date & time: 06/17/21  0865 ? ?  ? ?History ? ?Chief Complaint  ?Patient presents with  ? Abdominal Pain  ? Chest Pain  ? ? ?[name] is a 66 y.o. male. ? ? ?Abdominal Pain ?Associated symptoms: nausea and vomiting   ?Chest Pain ?Associated symptoms: abdominal pain, diaphoresis, nausea and vomiting   ?Patient presenting for abdominal pain and nausea.  He reports that he has had little p.o. intake over the past 3 days.  He attributes this to poor appetite secondary to sinus congestion.  Yesterday evening he began having abdominal cramping.  He describes this as generalized in location.  He had associated nausea.  He states that he took a nausea pill last night.  He also has been taking Protonix daily.  He did not take any medication this morning.  He has continued to have small bowel movements.  This morning, while sitting on the toilet he had an episode of nausea and diaphoresis.  His prompted him to come to the ED.  He reports that he has had similar episodes in the past.  He was told that his lab work was concerning for pancreatitis in the past, although he was never given formal diagnoses for similar symptoms.  Medical history is notable for chronic pain.  He has no known heart disease or family history of early heart disease.  He has discontinued NSAID use.  He treats his chronic pain with tramadol.  Currently, he states that both his pain and nausea have resolved. ?HPI: A 66 year old patient presents for evaluation of chest pain. Initial onset of pain was less than one hour ago. The patient's chest pain is described as heaviness/pressure/tightness and is not worse with exertion. The patient complains of nausea and reports some diaphoresis. The patient's chest pain is middle- or left-sided, is not well-localized, is not sharp and does not radiate to the arms/jaw/neck. The patient has smoked in the past 90 days.  The patient has no history of stroke, has no history of peripheral artery disease, denies any history of treated diabetes, has no relevant family history of coronary artery disease (first degree relative at less than age 71), is not hypertensive, has no history of hypercholesterolemia and does not have an elevated BMI (>=30).  ? ?Home Medications ?Prior to Admission medications   ?Medication Sig Start Date End Date Taking? Authorizing Provider  ?atorvastatin (LIPITOR) 40 MG tablet Take 40 mg by mouth every evening. 05/11/21  Yes [provider]  ?DULoxetine (CYMBALTA) 60 MG capsule Take 60 mg by mouth every evening. 08/22/20  Yes [provider]  ?gabapentin (NEURONTIN) 300 MG capsule Take 1 capsule by mouth at bedtime ?Patient taking differently: Take 300 mg by mouth as directed. Take 1 capsule (300 mg) TID in the evening 09/02/18  Yes Kathryne Hitch, MD  ?guaiFENesin (MUCINEX) 600 MG 12 hr tablet Take 600 mg by mouth 2 (two) times daily as needed for cough.   Yes [provider]  ?methocarbamol (ROBAXIN) 500 MG tablet Take 1 tablet (500 mg total) by mouth 3 (three) times daily. 03/14/19  Yes Kathryne Hitch, MD  ?Multiple Vitamins-Minerals (MULTIVITAMIN ADULT) CHEW Chew 1 tablet by mouth daily.   Yes [provider]  ?ondansetron (ZOFRAN-ODT) 8 MG disintegrating tablet Take 8 mg by mouth every 8 (eight) hours as needed for nausea or vomiting. 05/27/21  Yes [provider]  ?pantoprazole (PROTONIX) 40 MG tablet  Take 40 mg by mouth every morning. 05/27/21  Yes [provider]  ?sertraline (ZOLOFT) 100 MG tablet Take 100 mg by mouth as directed. Take 1 tablet BID in the evening 03/14/16  Yes [provider]  ?simethicone (MYLICON) 125 MG chewable tablet Chew 125 mg by mouth every 6 (six) hours as needed for flatulence.   Yes [provider]  ?sucralfate (CARAFATE) 1 g tablet Take 1 tablet (1 g total) by mouth 4 (four) times daily -  with  meals and at bedtime. 06/17/21  Yes Nanavati, Ankit, MD  ?traMADol (ULTRAM) 50 MG tablet TAKE 1 TO 2 TABLETS BY MOUTH EVERY 6 HOURS AS NEEDED ?Patient taking differently: Take 50-100 mg by mouth every 6 (six) hours as needed for moderate pain or severe pain. 07/31/20  Yes Kathryne HitchBlackman, Christopher Y, MD  ?methylPREDNISolone (MEDROL) 4 MG tablet Medrol dose pack. Take as instructed ?Patient not taking: Reported on 09/18/2020 07/16/20   Kathryne HitchBlackman, Christopher Y, MD  ?tiZANidine (ZANAFLEX) 4 MG tablet TAKE 1 TABLET BY MOUTH EVERY 8 HOURS AS NEEDED FOR MUSCLE SPASM ?Patient not taking: Reported on 06/17/2021 12/03/20   Eldred MangesYates, Mark C, MD  ?   ? ?Allergies    ?Patient has no known allergies.   ? ?Review of Systems   ?Review of Systems  ?Constitutional:  Positive for diaphoresis.  ?Gastrointestinal:  Positive for abdominal pain, nausea and vomiting.  ?All other systems reviewed and are negative. ? ?Physical Exam ?Updated Vital Signs ?BP 118/69   Pulse 99   Temp 98.5 ?F (36.9 ?C) (Oral)   Resp (!) 27   SpO2 (!) 89%  ?Physical Exam ?Vitals and nursing note reviewed.  ?Constitutional:   ?   General: He is not in acute distress. ?   Appearance: He is well-developed and normal weight. He is not ill-appearing, toxic-appearing or diaphoretic.  ?HENT:  ?   Head: Normocephalic and atraumatic.  ?   Mouth/Throat:  ?   Mouth: Mucous membranes are moist.  ?   Pharynx: Oropharynx is clear.  ?Eyes:  ?   General: No scleral icterus. ?   Extraocular Movements: Extraocular movements intact.  ?   Conjunctiva/sclera: Conjunctivae normal.  ?Cardiovascular:  ?   Rate and Rhythm: Normal rate and regular rhythm.  ?   Heart sounds: No murmur heard. ?Pulmonary:  ?   Effort: Pulmonary effort is normal. No respiratory distress.  ?   Breath sounds: Normal breath sounds. No wheezing or rales.  ?Abdominal:  ?   Palpations: Abdomen is soft.  ?   Tenderness: There is no abdominal tenderness.  ?Musculoskeletal:     ?   General: No swelling.  ?   Cervical back:  Neck supple.  ?Skin: ?   General: Skin is warm and dry.  ?   Capillary Refill: Capillary refill takes less than 2 seconds.  ?Neurological:  ?   General: No focal deficit present.  ?   Mental Status: He is alert and oriented to person, place, and time.  ?Psychiatric:     ?   Mood and Affect: Mood normal.     ?   Behavior: Behavior normal.  ? ? ?ED Results / Procedures / Treatments   ?Labs ?(all labs ordered are listed, but only abnormal results are displayed) ?Labs Reviewed  ?COMPREHENSIVE METABOLIC PANEL - Abnormal; Notable for the following components:  ?    Result Value  ? Glucose, Bld 141 (*)   ? Albumin 3.4 (*)   ? All other components  within normal limits  ?CBC - Abnormal; Notable for the following components:  ? WBC 13.7 (*)   ? Platelets 521 (*)   ? All other components within normal limits  ?URINALYSIS, ROUTINE W REFLEX MICROSCOPIC - Abnormal; Notable for the following components:  ? Color, Urine AMBER (*)   ? APPearance HAZY (*)   ? Ketones, ur 5 (*)   ? Protein, ur 100 (*)   ? Bacteria, UA RARE (*)   ? All other components within normal limits  ?TROPONIN I (HIGH SENSITIVITY) - Abnormal; Notable for the following components:  ? Troponin I (High Sensitivity) 22 (*)   ? All other components within normal limits  ?TROPONIN I (HIGH SENSITIVITY) - Abnormal; Notable for the following components:  ? Troponin I (High Sensitivity) 20 (*)   ? All other components within normal limits  ?LIPASE, BLOOD  ?MAGNESIUM  ? ? ?EKG ?EKG Interpretation ? ?Date/Time:  Monday June 17 2021 09:23:17 EDT ?Ventricular Rate:  85 ?PR Interval:  144 ?QRS Duration: 100 ?QT Interval:  372 ?QTC Calculation: 442 ?R Axis:   6 ?Text Interpretation: Sinus rhythm with marked sinus arrhythmia Nonspecific T wave abnormality Abnormal ECG Confirmed by Gloris Manchester [phone]) on 06/17/2021 10:14:53 AM ? ?Radiology ?DG Chest Portable 1 View ? ?Result Date: 06/17/2021 ?CLINICAL DATA:  Chest pain and abdominal pain. EXAM: PORTABLE CHEST 1 VIEW COMPARISON:   Chest x-ray on 11/30/2003 FINDINGS: Top-normal heart size and mildly tortuous thoracic aorta. There is no evidence of pulmonary edema, consolidation, pneumothorax, nodule or pleural fluid. IMPRESSION: Top-normal

## 2022-05-12 DIAGNOSIS — M79674 Pain in right toe(s): Secondary | ICD-10-CM | POA: Diagnosis not present

## 2022-06-25 DIAGNOSIS — R7309 Other abnormal glucose: Secondary | ICD-10-CM | POA: Diagnosis not present

## 2022-06-25 DIAGNOSIS — E785 Hyperlipidemia, unspecified: Secondary | ICD-10-CM | POA: Diagnosis not present

## 2022-06-25 DIAGNOSIS — Z79899 Other long term (current) drug therapy: Secondary | ICD-10-CM | POA: Diagnosis not present

## 2022-06-25 DIAGNOSIS — M79674 Pain in right toe(s): Secondary | ICD-10-CM | POA: Diagnosis not present

## 2022-07-02 DIAGNOSIS — M5136 Other intervertebral disc degeneration, lumbar region: Secondary | ICD-10-CM | POA: Diagnosis not present

## 2022-07-02 DIAGNOSIS — M5030 Other cervical disc degeneration, unspecified cervical region: Secondary | ICD-10-CM | POA: Diagnosis not present

## 2022-07-02 DIAGNOSIS — F419 Anxiety disorder, unspecified: Secondary | ICD-10-CM | POA: Diagnosis not present

## 2022-07-02 DIAGNOSIS — R7309 Other abnormal glucose: Secondary | ICD-10-CM | POA: Diagnosis not present

## 2022-07-02 DIAGNOSIS — Z1211 Encounter for screening for malignant neoplasm of colon: Secondary | ICD-10-CM | POA: Diagnosis not present

## 2022-07-02 DIAGNOSIS — E785 Hyperlipidemia, unspecified: Secondary | ICD-10-CM | POA: Diagnosis not present

## 2022-07-02 DIAGNOSIS — F339 Major depressive disorder, recurrent, unspecified: Secondary | ICD-10-CM | POA: Diagnosis not present

## 2022-07-02 DIAGNOSIS — Z79899 Other long term (current) drug therapy: Secondary | ICD-10-CM | POA: Diagnosis not present

## 2022-07-02 DIAGNOSIS — Z720 Tobacco use: Secondary | ICD-10-CM | POA: Diagnosis not present

## 2022-08-01 LAB — COLOGUARD

## 2022-08-08 DIAGNOSIS — R195 Other fecal abnormalities: Secondary | ICD-10-CM | POA: Diagnosis not present

## 2022-08-08 DIAGNOSIS — H5711 Ocular pain, right eye: Secondary | ICD-10-CM | POA: Diagnosis not present

## 2022-08-08 DIAGNOSIS — R5383 Other fatigue: Secondary | ICD-10-CM | POA: Diagnosis not present

## 2022-08-08 DIAGNOSIS — K30 Functional dyspepsia: Secondary | ICD-10-CM | POA: Diagnosis not present

## 2022-08-08 DIAGNOSIS — S0501XA Injury of conjunctiva and corneal abrasion without foreign body, right eye, initial encounter: Secondary | ICD-10-CM | POA: Diagnosis not present

## 2022-08-08 DIAGNOSIS — T1501XA Foreign body in cornea, right eye, initial encounter: Secondary | ICD-10-CM | POA: Diagnosis not present

## 2022-08-11 DIAGNOSIS — S0501XA Injury of conjunctiva and corneal abrasion without foreign body, right eye, initial encounter: Secondary | ICD-10-CM | POA: Diagnosis not present

## 2022-08-11 DIAGNOSIS — H5711 Ocular pain, right eye: Secondary | ICD-10-CM | POA: Diagnosis not present

## 2022-08-15 DIAGNOSIS — S0501XA Injury of conjunctiva and corneal abrasion without foreign body, right eye, initial encounter: Secondary | ICD-10-CM | POA: Diagnosis not present

## 2022-08-15 DIAGNOSIS — H5711 Ocular pain, right eye: Secondary | ICD-10-CM | POA: Diagnosis not present

## 2022-08-26 DIAGNOSIS — H5711 Ocular pain, right eye: Secondary | ICD-10-CM | POA: Diagnosis not present

## 2022-08-26 DIAGNOSIS — S0501XA Injury of conjunctiva and corneal abrasion without foreign body, right eye, initial encounter: Secondary | ICD-10-CM | POA: Diagnosis not present

## 2022-08-29 DIAGNOSIS — Z1211 Encounter for screening for malignant neoplasm of colon: Secondary | ICD-10-CM | POA: Diagnosis not present

## 2022-08-29 DIAGNOSIS — Z1212 Encounter for screening for malignant neoplasm of rectum: Secondary | ICD-10-CM | POA: Diagnosis not present

## 2022-09-04 LAB — COLOGUARD: COLOGUARD: POSITIVE — AB

## 2022-11-05 DIAGNOSIS — Z83719 Family history of colon polyps, unspecified: Secondary | ICD-10-CM | POA: Diagnosis not present

## 2022-11-05 DIAGNOSIS — R1013 Epigastric pain: Secondary | ICD-10-CM | POA: Diagnosis not present

## 2022-11-05 DIAGNOSIS — K921 Melena: Secondary | ICD-10-CM | POA: Diagnosis not present

## 2022-11-05 DIAGNOSIS — R195 Other fecal abnormalities: Secondary | ICD-10-CM | POA: Diagnosis not present

## 2022-11-05 DIAGNOSIS — K219 Gastro-esophageal reflux disease without esophagitis: Secondary | ICD-10-CM | POA: Diagnosis not present

## 2022-11-20 DIAGNOSIS — M47816 Spondylosis without myelopathy or radiculopathy, lumbar region: Secondary | ICD-10-CM | POA: Diagnosis not present

## 2022-11-20 DIAGNOSIS — M48061 Spinal stenosis, lumbar region without neurogenic claudication: Secondary | ICD-10-CM | POA: Diagnosis not present

## 2022-11-20 DIAGNOSIS — Z1330 Encounter for screening examination for mental health and behavioral disorders, unspecified: Secondary | ICD-10-CM | POA: Diagnosis not present

## 2022-12-02 DIAGNOSIS — M47816 Spondylosis without myelopathy or radiculopathy, lumbar region: Secondary | ICD-10-CM | POA: Diagnosis not present

## 2022-12-03 DIAGNOSIS — K319 Disease of stomach and duodenum, unspecified: Secondary | ICD-10-CM | POA: Diagnosis not present

## 2022-12-03 DIAGNOSIS — K5730 Diverticulosis of large intestine without perforation or abscess without bleeding: Secondary | ICD-10-CM | POA: Diagnosis not present

## 2022-12-03 DIAGNOSIS — K2980 Duodenitis without bleeding: Secondary | ICD-10-CM | POA: Diagnosis not present

## 2022-12-03 DIAGNOSIS — R195 Other fecal abnormalities: Secondary | ICD-10-CM | POA: Diagnosis not present

## 2022-12-03 DIAGNOSIS — K648 Other hemorrhoids: Secondary | ICD-10-CM | POA: Diagnosis not present

## 2022-12-03 DIAGNOSIS — D123 Benign neoplasm of transverse colon: Secondary | ICD-10-CM | POA: Diagnosis not present

## 2022-12-03 DIAGNOSIS — R1013 Epigastric pain: Secondary | ICD-10-CM | POA: Diagnosis not present

## 2022-12-03 DIAGNOSIS — D120 Benign neoplasm of cecum: Secondary | ICD-10-CM | POA: Diagnosis not present

## 2022-12-03 DIAGNOSIS — K3189 Other diseases of stomach and duodenum: Secondary | ICD-10-CM | POA: Diagnosis not present

## 2022-12-05 DIAGNOSIS — K2980 Duodenitis without bleeding: Secondary | ICD-10-CM | POA: Diagnosis not present

## 2022-12-05 DIAGNOSIS — D123 Benign neoplasm of transverse colon: Secondary | ICD-10-CM | POA: Diagnosis not present

## 2022-12-05 DIAGNOSIS — K319 Disease of stomach and duodenum, unspecified: Secondary | ICD-10-CM | POA: Diagnosis not present

## 2022-12-16 DIAGNOSIS — M47816 Spondylosis without myelopathy or radiculopathy, lumbar region: Secondary | ICD-10-CM | POA: Diagnosis not present

## 2022-12-31 DIAGNOSIS — R7309 Other abnormal glucose: Secondary | ICD-10-CM | POA: Diagnosis not present

## 2022-12-31 DIAGNOSIS — E785 Hyperlipidemia, unspecified: Secondary | ICD-10-CM | POA: Diagnosis not present

## 2022-12-31 DIAGNOSIS — Z79899 Other long term (current) drug therapy: Secondary | ICD-10-CM | POA: Diagnosis not present

## 2023-01-07 ENCOUNTER — Other Ambulatory Visit: Payer: Self-pay | Admitting: Family Medicine

## 2023-01-07 DIAGNOSIS — N529 Male erectile dysfunction, unspecified: Secondary | ICD-10-CM | POA: Diagnosis not present

## 2023-01-07 DIAGNOSIS — Z0000 Encounter for general adult medical examination without abnormal findings: Secondary | ICD-10-CM | POA: Diagnosis not present

## 2023-01-07 DIAGNOSIS — R7309 Other abnormal glucose: Secondary | ICD-10-CM | POA: Diagnosis not present

## 2023-01-07 DIAGNOSIS — F17200 Nicotine dependence, unspecified, uncomplicated: Secondary | ICD-10-CM | POA: Diagnosis not present

## 2023-01-07 DIAGNOSIS — F329 Major depressive disorder, single episode, unspecified: Secondary | ICD-10-CM | POA: Diagnosis not present

## 2023-01-07 DIAGNOSIS — M48062 Spinal stenosis, lumbar region with neurogenic claudication: Secondary | ICD-10-CM | POA: Diagnosis not present

## 2023-01-07 DIAGNOSIS — R972 Elevated prostate specific antigen [PSA]: Secondary | ICD-10-CM | POA: Diagnosis not present

## 2023-01-07 DIAGNOSIS — M5030 Other cervical disc degeneration, unspecified cervical region: Secondary | ICD-10-CM | POA: Diagnosis not present

## 2023-01-07 DIAGNOSIS — E785 Hyperlipidemia, unspecified: Secondary | ICD-10-CM | POA: Diagnosis not present

## 2023-01-16 ENCOUNTER — Encounter: Payer: Self-pay | Admitting: Family Medicine

## 2023-01-21 ENCOUNTER — Inpatient Hospital Stay
Admission: RE | Admit: 2023-01-21 | Discharge: 2023-01-21 | Disposition: A | Discharge status: Home / Self Care | Payer: Medicare Other | Source: Ambulatory Visit | Attending: Family Medicine | Admitting: Family Medicine

## 2023-01-21 DIAGNOSIS — F17200 Nicotine dependence, unspecified, uncomplicated: Secondary | ICD-10-CM

## 2023-01-21 DIAGNOSIS — F17210 Nicotine dependence, cigarettes, uncomplicated: Secondary | ICD-10-CM | POA: Diagnosis not present

## 2023-02-21 DIAGNOSIS — H25813 Combined forms of age-related cataract, bilateral: Secondary | ICD-10-CM | POA: Diagnosis not present

## 2023-02-21 DIAGNOSIS — H04123 Dry eye syndrome of bilateral lacrimal glands: Secondary | ICD-10-CM | POA: Diagnosis not present

## 2023-02-21 DIAGNOSIS — H35411 Lattice degeneration of retina, right eye: Secondary | ICD-10-CM | POA: Diagnosis not present

## 2023-02-21 DIAGNOSIS — H43393 Other vitreous opacities, bilateral: Secondary | ICD-10-CM | POA: Diagnosis not present

## 2023-04-06 NOTE — Progress Notes (Unsigned)
Cardiology Office Note:  .   Date:  04/08/2023 ID:  [name], DOB 01-05-1956, MRN [id number] PCP: Irena Reichmann, DO [facility] Providers Cardiologist:  None   Patient Profile: .      PMH Coronary calcification seen on CT Tobacco abuse Hyperlipidemia GERD Pre-diabetes Pancreatitis  Aortic atherosclerosis Emphysema Lactose intolerance       History of Present Illness: .   [name] is a very pleasant 68 y.o. male  who is here today for new patient consult for coronary artery calcification seen on CT chest lung cancer screening. 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 He was told he had a heart artery blockage about a year ago, seen on abdominal CT. Then underwent recent lung scan which also revealed three-vessel coronary atherosclerosis. He denies chest pain, shortness of breath, palpitations, orthopnea, PND, edema, presyncope, syncope. He recently joined a gym and has started cycling, managing to cycle three miles without any discomfort or unusual symptoms a few days ago. His wife is concerned about his diet and they have both been trying to eat better and get more exercise. He has a preference for snacks and large bowls of cereal (frosted mini-wheats with 2% milk). He is a current smoker, although he has reduced his consumption to half a pack a day from a pack a day. His wife has recently quit smoking after being diagnosed with COPD. He acknowledges the need to quit smoking completely. He reports excessive sweating at night, a condition that has persisted for several years but no diaphoresis with exertion.  He has been on cholesterol medicine for a long time.  His family history is significant for his mother who has high cholesterol and CAD.  Unfortunately his father died from lung cancer.  He is unsure whether there was any evidence of heart disease.   Family history: His family  history includes Alcoholism in his sister; Asthma in his paternal grandfather; CAD in his mother; Cancer in his brother and father; Diabetes in his mother.   ASCVD Risk Score:  ASCVD (Atherosclerotic Cardiovascular Disease) Risk Algorithm including Known ASCVD from AHA/ACC from [email or web address]  on 04/06/2023 ** All calculations should be rechecked by clinician prior to use **  RESULT SUMMARY: 16.3 % Risk of cardiovascular event (coronary or stroke death or non-fatal MI or stroke) in next 10 years.   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 Rode 3 miles on stationary bike a few days ago with no concerning symptoms  ROS: See HPI       Studies Reviewed: Marland Kitchen   EKG Interpretation Date/Time:  Wednesday April 08 2023 08:51:31 EST Ventricular Rate:  64 PR Interval:  164 QRS Duration:  98 QT Interval:  404 QTC  Calculation: 416 R Axis:   -37  Text Interpretation: Sinus rhythm with marked sinus arrhythmia Left axis deviation Nonspecific T wave abnormality No acute changes Confirmed by Eligha Bridegroom [phone]) on 04/08/2023 9:06:57 AM      Risk Assessment/Calculations:             Physical Exam:   VS: BP 122/70 (Cuff Size: Normal)   Pulse 69   Ht 5\' 9"  (1.753 m)   Wt 180 lb 14.4 oz (82.1 kg)   SpO2 93%   BMI 26.71 kg/m   Wt Readings from Last 3 Encounters:  04/08/23 180 lb 14.4 oz (82.1 kg)  09/18/20 192 lb 6.4 oz (87.3  kg)  08/27/20 210 lb (95.3 kg)     GEN: Well nourished, well developed in no acute distress NECK: No JVD; No carotid bruits CARDIAC: RRR, no murmurs, rubs, gallops RESPIRATORY:  Clear to auscultation without rales, wheezing or rhonchi  ABDOMEN: Soft, non-tender, non-distended EXTREMITIES:  No edema; No deformity     ASSESSMENT AND PLAN: .    Coronary calcification: Three-vessel coronary atherosclerosis identified on chest CT lung cancer screening. EKG today shows NSR at 64 bpm, sinus arrhythmia, and nonspecific TW abnormality.  He reports he has been on lipid-lowering therapy for a long time.  Most recent LDL was 128 on 12/31/2022.  He was advised by PCP to start Nexletol, however never received the medication.  We will get a CT calcium score to assess for extent of calcification. We will prescribe Nexletol in addition to atorvastatin to lower LDL cholesterol. Recheck cholesterol levels in 2-3 months. Encouraged to gradually increase exercise intensity and report of any chest pain or shortness of breath. Focus on secondary prevention including heart healthy mostly plant based diet avoiding saturated fat, processed foods, simple carbohydrates, and sugar along with aiming for at least 150 minutes of moderate intensity exercise each week.   Hyperlipidemia LDL goal < 70: Nexletol 180 mg daily was recommended by PCP, however they never received the Rx. They did receive notification from [facility] that it would be covered.  We will add Nexletol 180 mg daily in addition to atorvastatin 40 mg daily.  We will get NMR, LP(a), and LFTs in 2 to 3 months.  Tobacco abuse: Has decreased smoking from 1 ppd to 1/2 ppd. Expresses motivation to quit and believes he can do it on his own. Complete cessation advised.  CV Risk Assessment: ASCVD risk score 16.3%. He has a history of high cholesterol but no history of hypertension. BP is well controlled today. We are adding Nexletol in addition to atorvastatin for LDL goal  70 or lower.  We will get CT calcium score for further risk stratification.  Encouraged him to continue regular exercise with gradual increase in intensity and call to report any concerning cardiac symptoms.   Plan/Goals: Plan/Goals: 1: Increase exercise to achieve 150 minutes of moderate intensity exercise such as cycling each week 2: Decrease intake of saturated fat in dairy products and red meat 3: Aim ot limit snack foods and eat lots of frutis, vegetables, whole grains and lean protein        Disposition: 2-3 months with me  Signed, Eligha Bridegroom, NP-C

## 2023-04-08 ENCOUNTER — Ambulatory Visit (HOSPITAL_BASED_OUTPATIENT_CLINIC_OR_DEPARTMENT_OTHER): Payer: Medicare HMO | Admitting: Nurse Practitioner

## 2023-04-08 ENCOUNTER — Other Ambulatory Visit (HOSPITAL_BASED_OUTPATIENT_CLINIC_OR_DEPARTMENT_OTHER): Payer: Self-pay | Admitting: *Deleted

## 2023-04-08 ENCOUNTER — Encounter (HOSPITAL_BASED_OUTPATIENT_CLINIC_OR_DEPARTMENT_OTHER): Payer: Self-pay | Admitting: Nurse Practitioner

## 2023-04-08 VITALS — BP 122/70 | HR 69 | Ht 69.0 in | Wt 180.9 lb

## 2023-04-08 DIAGNOSIS — Z720 Tobacco use: Secondary | ICD-10-CM | POA: Diagnosis not present

## 2023-04-08 DIAGNOSIS — Z7189 Other specified counseling: Secondary | ICD-10-CM

## 2023-04-08 DIAGNOSIS — I2510 Atherosclerotic heart disease of native coronary artery without angina pectoris: Secondary | ICD-10-CM | POA: Diagnosis not present

## 2023-04-08 DIAGNOSIS — E785 Hyperlipidemia, unspecified: Secondary | ICD-10-CM

## 2023-04-08 MED ORDER — NEXLETOL 180 MG PO TABS: 1.0000 | ORAL_TABLET | Freq: Every day | ORAL | 3 refills | Status: DC

## 2023-04-08 MED ORDER — NEXLIZET 180-10 MG PO TABS: 1.0000 | ORAL_TABLET | Freq: Every day | ORAL | 3 refills | Status: DC

## 2023-04-08 NOTE — Addendum Note (Signed)
Addended by: Levi Aland on: 04/08/2023 02:56 PM   Modules accepted: Level of Service

## 2023-04-08 NOTE — Patient Instructions (Signed)
Medication Instructions:   START Nexletol one (1) tablet by mouth ( 180 mg) daily at 6 pm.   *If you need a refill on your cardiac medications before your next appointment, please call your pharmacy*   Lab Work:  Your physician recommends that you return for a FASTING NMR/CMET/LPA. 2-3 months prior to follow up appointment in April. Patient was given paperwork today.    If you have labs (blood work) drawn today and your tests are completely normal, you will receive your results only by: MyChart Message (if you have MyChart) OR A paper copy in the mail If you have any lab test that is abnormal or we need to change your treatment, we will call you to review the results.   Testing/Procedures:  Eligha Bridegroom, NP  has ordered a CT coronary calcium score.   Test locations:   [facility]    This is $99 out of pocket.   Coronary CalciumScan A coronary calcium scan is an imaging test used to look for deposits of calcium and other fatty materials (plaques) in the inner lining of the blood vessels of the heart (coronary arteries). These deposits of calcium and plaques can partly clog and narrow the coronary arteries without producing any symptoms or warning signs. This puts a person at risk for a heart attack. This test can detect these deposits before symptoms develop. Tell a health care provider about: Any allergies you have. All medicines you are taking, including vitamins, herbs, eye drops, creams, and over-the-counter medicines. Any problems you or family members have had with anesthetic medicines. Any blood disorders you have. Any surgeries you have had. Any medical conditions you have. Whether you are pregnant or may be pregnant. What are the risks? Generally, this is a safe procedure. However, problems may occur, including: Harm to a pregnant woman and her unborn baby. This test involves the use of radiation. Radiation exposure can be dangerous to a pregnant woman  and her unborn baby. If you are pregnant, you generally should not have this procedure done. Slight increase in the risk of cancer. This is because of the radiation involved in the test. What happens before the procedure? No preparation is needed for this procedure. What happens during the procedure? You will undress and remove any jewelry around your neck or chest. You will put on a hospital gown. Sticky electrodes will be placed on your chest. The electrodes will be connected to an electrocardiogram (ECG) machine to record a tracing of the electrical activity of your heart. A CT scanner will take pictures of your heart. During this time, you will be asked to lie still and hold your breath for 2-3 seconds while a picture of your heart is being taken. The procedure may vary among health care providers and hospitals. What happens after the procedure? You can get dressed. You can return to your normal activities. It is up to you to get the results of your test. Ask your health care provider, or the department that is doing the test, when your results will be ready. Summary A coronary calcium scan is an imaging test used to look for deposits of calcium and other fatty materials (plaques) in the inner lining of the blood vessels of the heart (coronary arteries). Generally, this is a safe procedure. Tell your health care provider if you are pregnant or may be pregnant. No preparation is needed for this procedure. A CT scanner will take pictures of your heart. You can return to your normal  activities after the scan is done. This information is not intended to replace advice given to you by your health care provider. Make sure you discuss any questions you have with your health care provider. Document Released: 08/23/2007 Document Revised: 01/14/2016 Document Reviewed: 01/14/2016 Elsevier Interactive Patient Education  2017 [employer].    Follow-Up: At [facility], you and your health  needs are our priority.  As part of our continuing mission to provide you with exceptional heart care, we have created designated Provider Care Teams.  These Care Teams include your primary Cardiologist (physician) and Advanced Practice Providers (APPs -  Physician Assistants and Nurse Practitioners) who all work together to provide you with the care you need, when you need it.  We recommend signing up for the patient portal called "MyChart".  Sign up information is provided on this After Visit Summary.  MyChart is used to connect with patients for Virtual Visits (Telemedicine).  Patients are able to view lab/test results, encounter notes, upcoming appointments, etc.  Non-urgent messages can be sent to your provider as well.   To learn more about what you can do with MyChart, go to [email or web address].    Your next appointment:   3 month(s)  Provider:   Eligha Bridegroom, NP    Other Instructions      Mediterranean Diet  Why follow it? Research shows. Those who follow the Mediterranean diet have a reduced risk of heart disease  The diet is associated with a reduced incidence of Parkinson's and Alzheimer's diseases People following the diet may have longer life expectancies and lower rates of chronic diseases  The Dietary Guidelines for Americans recommends the Mediterranean diet as an eating plan to promote health and prevent disease  What Is the Mediterranean Diet?  Healthy eating plan based on typical foods and recipes of Mediterranean-style cooking The diet is primarily a plant based diet; these foods should make up a majority of meals   Starches - Plant based foods should make up a majority of meals - They are an important sources of vitamins, minerals, energy, antioxidants, and fiber - Choose whole grains, foods high in fiber and minimally processed items  - Typical grain sources include wheat, oats, barley, corn, brown rice, bulgar, farro, millet, polenta, couscous  - Various  types of beans include chickpeas, lentils, fava beans, black beans, white beans   Fruits  Veggies - Large quantities of antioxidant rich fruits & veggies; 6 or more servings  - Vegetables can be eaten raw or lightly drizzled with oil and cooked  - Vegetables common to the traditional Mediterranean Diet include: artichokes, arugula, beets, broccoli, brussel sprouts, cabbage, carrots, celery, collard greens, cucumbers, eggplant, kale, leeks, lemons, lettuce, mushrooms, okra, onions, peas, peppers, potatoes, pumpkin, radishes, rutabaga, shallots, spinach, sweet potatoes, turnips, zucchini - Fruits common to the Mediterranean Diet include: apples, apricots, avocados, cherries, clementines, dates, figs, grapefruits, grapes, melons, nectarines, oranges, peaches, pears, pomegranates, strawberries, tangerines  Fats - Replace butter and margarine with healthy oils, such as olive oil, canola oil, and tahini  - Limit nuts to no more than a handful a day  - Nuts include walnuts, almonds, pecans, pistachios, pine nuts  - Limit or avoid candied, honey roasted or heavily salted nuts - Olives are central to the [employer] - can be eaten whole or used in a variety of dishes   Meats Protein - Limiting red meat: no more than a few times a month - When eating red meat: choose lean cuts  and keep the portion to the size of deck of cards - Eggs: approx. 0 to 4 times a week  - Fish and lean poultry: at least 2 a week  - Healthy protein sources include, chicken, [place], lean beef, lamb - Increase intake of seafood such as tuna, salmon, trout, mackerel, shrimp, scallops - Avoid or limit high fat processed meats such as sausage and bacon  Dairy - Include moderate amounts of low fat dairy products  - Focus on healthy dairy such as fat free yogurt, skim milk, low or reduced fat cheese - Limit dairy products higher in fat such as whole or 2% milk, cheese, ice cream  Alcohol - Moderate amounts of red wine is ok  - No  more than 5 oz daily for women (all ages) and men older than age 2  - No more than 10 oz of wine daily for men younger than 43  Other - Limit sweets and other desserts  - Use herbs and spices instead of salt to flavor foods  - Herbs and spices common to the traditional Mediterranean Diet include: basil, bay leaves, chives, cloves, cumin, fennel, garlic, lavender, marjoram, mint, oregano, parsley, pepper, rosemary, sage, savory, sumac, tarragon, thyme   It's not just a diet, it's a lifestyle:  The Mediterranean diet includes lifestyle factors typical of those in the region  Foods, drinks and meals are best eaten with others and savored Daily physical activity is important for overall good health This could be strenuous exercise like running and aerobics This could also be more leisurely activities such as walking, housework, yard-work, or taking the stairs Moderation is the key; a balanced and healthy diet accommodates most foods and drinks Consider portion sizes and frequency of consumption of certain foods   Meal Ideas & Options:  Breakfast:  Whole wheat toast or whole wheat English muffins with peanut butter & hard boiled egg Steel cut oats topped with apples & cinnamon and skim milk  Fresh fruit: banana, strawberries, melon, berries, peaches  Smoothies: strawberries, bananas, greek yogurt, peanut butter Low fat greek yogurt with blueberries and granola  Egg white omelet with spinach and mushrooms Breakfast couscous: whole wheat couscous, apricots, skim milk, cranberries  Sandwiches:  Hummus and grilled vegetables (peppers, zucchini, squash) on whole wheat bread   Grilled chicken on whole wheat pita with lettuce, tomatoes, cucumbers or tzatziki  [place] salad on whole wheat bread: tuna salad made with greek yogurt, olives, red peppers, capers, green onions Garlic rosemary lamb pita: lamb sauted with garlic, rosemary, salt & pepper; add lettuce, cucumber, greek yogurt to pita - flavor  with lemon juice and black pepper  Seafood:  Mediterranean grilled salmon, seasoned with garlic, basil, parsley, lemon juice and black pepper Shrimp, lemon, and spinach whole-grain pasta salad made with low fat greek yogurt  Seared scallops with lemon orzo  Seared tuna steaks seasoned salt, pepper, coriander topped with tomato mixture of olives, tomatoes, olive oil, minced garlic, parsley, green onions and cappers  Meats:  Herbed greek chicken salad with kalamata olives, cucumber, feta  Red bell peppers stuffed with spinach, bulgur, lean ground beef (or lentils) & topped with feta   Kebabs: skewers of chicken, tomatoes, onions, zucchini, squash  [place] burgers: made with red onions, mint, dill, lemon juice, feta cheese topped with roasted red peppers Vegetarian Cucumber salad: cucumbers, artichoke hearts, celery, red onion, feta cheese, tossed in olive oil & lemon juice  Hummus and whole grain pita points with a greek salad (lettuce, tomato,  feta, olives, cucumbers, red onion) Lentil soup with celery, carrots made with vegetable broth, garlic, salt and pepper  Tabouli salad: parsley, bulgur, mint, scallions, cucumbers, tomato, radishes, lemon juice, olive oil, salt and pepper.  Adopting a Healthy Lifestyle.   Weight: Know what a healthy weight is for you (roughly BMI <25) and aim to maintain this. You can calculate your body mass index on your smart phone. Unfortunately, this is not the most accurate measure of healthy weight, but it is the simplest measurement to use. A more accurate measurement involves body scanning which measures lean muscle, fat tissue and bony density. We do not have this equipment at [facility].    Diet: Aim for 7+ servings of fruits and vegetables daily Limit animal fats in diet for cholesterol and heart health - choose grass fed whenever available Avoid highly processed foods (fast food burgers, tacos, fried chicken, pizza, hot dogs, french fries)  Saturated fat comes  in the form of butter, lard, coconut oil, margarine, partially hydrogenated oils, and fat in meat. These increase your risk of cardiovascular disease.  Use healthy plant oils, such as olive, canola, soy, corn, sunflower and peanut.  Whole foods such as fruits, vegetables and whole grains have fiber  Men need > 38 grams of fiber per day Women need > 25 grams of fiber per day  Load up on vegetables and fruits - one-half of your plate: Aim for color and variety, and remember that potatoes dont count. Go for whole grains - one-quarter of your plate: Whole wheat, barley, wheat berries, quinoa, oats, brown rice, and foods made with them. If you want pasta, go with whole wheat pasta. Protein power - one-quarter of your plate: Fish, chicken, beans, and nuts are all healthy, versatile protein sources. Limit red meat. You need carbohydrates for energy! The type of carbohydrate is more important than the amount. Choose carbohydrates such as vegetables, fruits, whole grains, beans, and nuts in the place of white rice, white pasta, potatoes (baked or fried), macaroni and cheese, cakes, cookies, and donuts.  If youre thirsty, drink water. Coffee and tea are good in moderation, but skip sugary drinks and limit milk and dairy products to one or two daily servings. Keep sugar intake at 6 teaspoons or 24 grams or LESS       Exercise: Aim for 150 min of moderate intensity exercise weekly for heart health, and weights twice weekly for bone health Stay active - any steps are better than no steps! Aim for 7-9 hours of sleep daily         Goals: 1: Increase exercise to achieve 150 minutes of moderate intensity exercise such as cycling each week 2: Decrease intake of saturated fat in dairy products and red meat 3: Aim ot limit snack foods and eat lots of frutis, vegetables, whole grains and lean protein

## 2023-04-10 ENCOUNTER — Other Ambulatory Visit (HOSPITAL_COMMUNITY): Payer: Self-pay

## 2023-04-13 ENCOUNTER — Encounter (HOSPITAL_BASED_OUTPATIENT_CLINIC_OR_DEPARTMENT_OTHER): Payer: Self-pay

## 2023-04-13 MED ORDER — EZETIMIBE 10 MG PO TABS: 10.0000 mg | ORAL_TABLET | Freq: Every day | ORAL | 3 refills | Status: DC

## 2023-04-14 ENCOUNTER — Other Ambulatory Visit: Payer: Self-pay | Admitting: *Deleted

## 2023-04-14 DIAGNOSIS — E785 Hyperlipidemia, unspecified: Secondary | ICD-10-CM

## 2023-04-14 DIAGNOSIS — I2510 Atherosclerotic heart disease of native coronary artery without angina pectoris: Secondary | ICD-10-CM

## 2023-04-20 ENCOUNTER — Encounter (HOSPITAL_BASED_OUTPATIENT_CLINIC_OR_DEPARTMENT_OTHER): Payer: Self-pay

## 2023-04-21 ENCOUNTER — Encounter: Payer: Self-pay | Admitting: Nurse Practitioner

## 2023-05-11 ENCOUNTER — Ambulatory Visit (HOSPITAL_BASED_OUTPATIENT_CLINIC_OR_DEPARTMENT_OTHER)
Admission: RE | Admit: 2023-05-11 | Discharge: 2023-05-11 | Disposition: A | Discharge status: Home / Self Care | Payer: Self-pay | Source: Ambulatory Visit | Attending: Nurse Practitioner | Admitting: Nurse Practitioner

## 2023-05-11 DIAGNOSIS — I2510 Atherosclerotic heart disease of native coronary artery without angina pectoris: Secondary | ICD-10-CM | POA: Insufficient documentation

## 2023-05-12 ENCOUNTER — Telehealth: Payer: Self-pay | Admitting: *Deleted

## 2023-05-12 ENCOUNTER — Encounter: Payer: Self-pay | Admitting: *Deleted

## 2023-05-12 DIAGNOSIS — I2510 Atherosclerotic heart disease of native coronary artery without angina pectoris: Secondary | ICD-10-CM

## 2023-05-12 DIAGNOSIS — Z7189 Other specified counseling: Secondary | ICD-10-CM

## 2023-05-12 NOTE — Telephone Encounter (Signed)
-----   Message from [name] sent at 05/12/2023  9:00 AM EST ----- Please call patient and notify him of significantly elevated calcium score with a large amount of calcium in his LAD. Based on his EKG at the last office visit, I would like to get a lexiscan myoview to rule out ischemia. If he is having any concerning symptoms, he should seek emergency medical attention. I would like for him to start aspirin 81 mg daily.

## 2023-05-13 ENCOUNTER — Encounter (HOSPITAL_COMMUNITY): Payer: Self-pay

## 2023-05-13 ENCOUNTER — Other Ambulatory Visit (HOSPITAL_BASED_OUTPATIENT_CLINIC_OR_DEPARTMENT_OTHER): Payer: Self-pay | Admitting: *Deleted

## 2023-05-14 ENCOUNTER — Ambulatory Visit (HOSPITAL_COMMUNITY): Attending: Internal Medicine

## 2023-05-14 DIAGNOSIS — I2510 Atherosclerotic heart disease of native coronary artery without angina pectoris: Secondary | ICD-10-CM | POA: Diagnosis not present

## 2023-05-14 LAB — MYOCARDIAL PERFUSION IMAGING
LV dias vol: 195 mL (ref 62–150)
LV sys vol: 110 mL
Nuc Stress EF: 44 %
Peak HR: 74 {beats}/min
Rest HR: 62 {beats}/min
Rest Nuclear Isotope Dose: 10.3 mCi
SDS: 0
SRS: 2
SSS: 2
ST Depression (mm): 0 mm
Stress Nuclear Isotope Dose: 30.9 mCi
TID: 1.36

## 2023-05-14 MED ORDER — TECHNETIUM TC 99M TETROFOSMIN IV KIT
10.3000 | PACK | Freq: Once | INTRAVENOUS | Status: AC | PRN
  Administered 2023-05-14: 10.3 via INTRAVENOUS

## 2023-05-14 MED ORDER — TECHNETIUM TC 99M TETROFOSMIN IV KIT
30.9000 | PACK | Freq: Once | INTRAVENOUS | Status: AC | PRN
  Administered 2023-05-14: 30.9 via INTRAVENOUS

## 2023-05-14 MED ORDER — REGADENOSON 0.4 MG/5ML IV SOLN
0.4000 mg | Freq: Once | INTRAVENOUS | Status: AC
  Administered 2023-05-14: 0.4 mg via INTRAVENOUS

## 2023-05-15 ENCOUNTER — Encounter: Payer: Self-pay | Admitting: Cardiovascular Disease

## 2023-05-15 ENCOUNTER — Ambulatory Visit: Attending: Cardiovascular Disease | Admitting: Cardiovascular Disease

## 2023-05-15 VITALS — BP 126/84 | HR 82 | Ht 69.0 in | Wt 175.6 lb

## 2023-05-15 DIAGNOSIS — Z01810 Encounter for preprocedural cardiovascular examination: Secondary | ICD-10-CM

## 2023-05-15 DIAGNOSIS — R9439 Abnormal result of other cardiovascular function study: Secondary | ICD-10-CM

## 2023-05-15 DIAGNOSIS — I2510 Atherosclerotic heart disease of native coronary artery without angina pectoris: Secondary | ICD-10-CM

## 2023-05-15 DIAGNOSIS — E785 Hyperlipidemia, unspecified: Secondary | ICD-10-CM

## 2023-05-15 MED ORDER — ASPIRIN 81 MG PO TBEC: 81.0000 mg | DELAYED_RELEASE_TABLET | Freq: Every day | ORAL | Status: DC

## 2023-05-15 NOTE — Patient Instructions (Addendum)
 Medications: START Aspirin 81mg  daily  Lab Work: CBC, BMET today If you have labs (blood work) drawn today and your tests are completely normal, you will receive your results only by: MyChart Message (if you have MyChart) OR A paper copy in the mail If you have any lab test that is abnormal or we need to change your treatment, we will call you to review the results.  Testing/Procedures: L heart catheterization Your physician has requested that you have a cardiac catheterization. Cardiac catheterization is used to diagnose and/or treat various heart conditions. Doctors may recommend this procedure for a number of different reasons. The most common reason is to evaluate chest pain. Chest pain can be a symptom of coronary artery disease (CAD), and cardiac catheterization can show whether plaque is narrowing or blocking your heart's arteries. This procedure is also used to evaluate the valves, as well as measure the blood flow and oxygen levels in different parts of your heart. For further information please visit [email or web address] Please follow instruction sheet, as given.  Follow-Up: At [facility], you and your health needs are our priority.  As part of our continuing mission to provide you with exceptional heart care, we have created designated Provider Care Teams.  These Care Teams include your primary Cardiologist (physician) and Advanced Practice Providers (APPs -  Physician Assistants and Nurse Practitioners) who all work together to provide you with the care you need, when you need it.  Your next appointment:   As Scheduled  Provider:   Eligha Bridegroom, NP    Other Instructions       Cardiac/Peripheral Catheterization   You are scheduled for a Cardiac Catheterization on Monday, March 10 with Dr. Cristal De[place] End.  1. Please arrive at the [facility] (Main Entrance A) at [facility]: [address] [place], [place] [address] at 7:00 AM (This time is two  hour(s) before your procedure to ensure your preparation).   Free valet parking service is available. You will check in at ADMITTING. The support person will be asked to wait in the waiting room.  It is OK to have someone drop you off and come back when you are ready to be discharged.        Special note: Every effort is made to have your procedure done on time. Please understand that emergencies sometimes delay scheduled procedures.  2. Diet: Do not eat solid foods after midnight.  You may have clear liquids until 5 AM the day of the procedure.  3. Labs: TODAY  4. Medication instructions in preparation for your procedure:   Contrast Allergy: No  On the morning of your procedure, take Aspirin 81 mg and any morning medicines NOT listed above.  You may use sips of water.  5. Plan to go home the same day, you will only stay overnight if medically necessary. 6. You MUST have a responsible adult to drive you home. 7. An adult MUST be with you the first 24 hours after you arrive home. 8. Bring a current list of your medications, and the last time and date medication taken. 9. Bring ID and current insurance cards. 10.Please wear clothes that are easy to get on and off and wear slip-on shoes.  Thank you for allowing [place] to care for you!   -- [facility]

## 2023-05-15 NOTE — H&P (View-Only) (Signed)
 Cardiology Office Note:  .   Date:  05/15/2023  ID:  [name], DOB March 09, 1956, MRN [id number] PCP: Dalton Reichmann, DO  [facility] Providers Cardiologist:  Dalton Red, MD    History of Present Illness: .   [name] is a 68 y.o. male with hx of HLD, coronary calcifications and an abnormal lexiscan study  He presents today to discuss cardiac cath   Had a coronary calcium scan at the request of his doctor    Has a long history of cigarette smoking.  Has a history of hyperlipidemia.  His last LDL was 128.  This level was drawn on rosuvastatin.  He has lots of hip and back pain.  He ends up stopping because of hip and back pain and so he does not get shortness of breath with exertion.   I think he would have significant DOE if he were able to walk       ROS:   Studies Reviewed: Marland Kitchen    EKG Interpretation Date/Time:  Friday May 15 2023 14:00:50 EDT Ventricular Rate:  72 PR Interval:  144 QRS Duration:  100 QT Interval:  388 QTC Calculation: 424 R Axis:   8  Text Interpretation: Normal sinus rhythm with sinus arrhythmia Normal ECG When compared with ECG of 08-Apr-2023 08:51, QRS axis Shifted right Nonspecific T wave abnormality now evident in Inferior leads Nonspecific T wave abnormality no longer evident in Lateral leads Confirmed by [name] ([id number]) on 05/17/2023 9:07:28 PM    Risk Assessment/Calculations:             Physical Exam:   VS:  BP 126/84   Pulse 82   Ht 5\' 9"  (1.753 m)   Wt 175 lb 9.6 oz (79.7 kg)   SpO2 96%   BMI 25.93 kg/m    Wt Readings from Last 3 Encounters:  05/15/23 175 lb 9.6 oz (79.7 kg)  05/14/23 180 lb (81.6 kg)  04/08/23 180 lb 14.4 oz (82.1 kg)    GEN: Well nourished, well developed in no acute distress NECK: No JVD; No carotid bruits CARDIAC: RRR, no murmurs, rubs, gallops RESPIRATORY:  Clear to auscultation without rales, wheezing or rhonchi  ABDOMEN: Soft, non-tender, non-distended EXTREMITIES:  No  edema; No deformity   ASSESSMENT AND PLAN: .   1.   Coronary artery disease :   Abnormal stress test: [name] has a long history of cigarette smoking.  He really is not able to exercise much and I suspect that he would be having angina or dyspnea on exertion with exercise but he is simply not able to exert himself because of his orthopedic problems.  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  We have discussed the risk, benefits, options of heart catheterization.  He understands and agrees to proceed.  Will start ASA 81 mg a day       Informed Consent   Shared Decision Making/Informed Consent The risks [stroke (1 in 1000), death (1 in 1000), kidney failure [usually temporary] (1 in 500), bleeding (1 in 200), allergic reaction [possibly serious] (1 in 200)], benefits (diagnostic support and management of coronary artery disease) and alternatives of a cardiac catheterization were discussed in detail with [name] and he is willing to proceed.     Dispo: cardiac cath next week    Signed, [name]  Dalton Wollam, MD

## 2023-05-15 NOTE — Progress Notes (Signed)
 Cardiology Office Note:  .   Date:  05/15/2023  ID:  [name], DOB March 09, 1956, MRN [id number] PCP: Irena Reichmann, DO  [facility] Providers Cardiologist:  Jodelle Red, MD    History of Present Illness: .   [name] is a 68 y.o. male with hx of HLD, coronary calcifications and an abnormal lexiscan study  He presents today to discuss cardiac cath   Had a coronary calcium scan at the request of his doctor    Has a long history of cigarette smoking.  Has a history of hyperlipidemia.  His last LDL was 128.  This level was drawn on rosuvastatin.  He has lots of hip and back pain.  He ends up stopping because of hip and back pain and so he does not get shortness of breath with exertion.   I think he would have significant DOE if he were able to walk       ROS:   Studies Reviewed: Marland Kitchen    EKG Interpretation Date/Time:  Friday May 15 2023 14:00:50 EDT Ventricular Rate:  72 PR Interval:  144 QRS Duration:  100 QT Interval:  388 QTC Calculation: 424 R Axis:   8  Text Interpretation: Normal sinus rhythm with sinus arrhythmia Normal ECG When compared with ECG of 08-Apr-2023 08:51, QRS axis Shifted right Nonspecific T wave abnormality now evident in Inferior leads Nonspecific T wave abnormality no longer evident in Lateral leads Confirmed by Kristeen Miss ([id number]) on 05/17/2023 9:07:28 PM    Risk Assessment/Calculations:             Physical Exam:   VS:  BP 126/84   Pulse 82   Ht 5\' 9"  (1.753 m)   Wt 175 lb 9.6 oz (79.7 kg)   SpO2 96%   BMI 25.93 kg/m    Wt Readings from Last 3 Encounters:  05/15/23 175 lb 9.6 oz (79.7 kg)  05/14/23 180 lb (81.6 kg)  04/08/23 180 lb 14.4 oz (82.1 kg)    GEN: Well nourished, well developed in no acute distress NECK: No JVD; No carotid bruits CARDIAC: RRR, no murmurs, rubs, gallops RESPIRATORY:  Clear to auscultation without rales, wheezing or rhonchi  ABDOMEN: Soft, non-tender, non-distended EXTREMITIES:  No  edema; No deformity   ASSESSMENT AND PLAN: .   1.   Coronary artery disease :   Abnormal stress test: [name] has a long history of cigarette smoking.  He really is not able to exercise much and I suspect that he would be having angina or dyspnea on exertion with exercise but he is simply not able to exert himself because of his orthopedic problems.  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  We have discussed the risk, benefits, options of heart catheterization.  He understands and agrees to proceed.  Will start ASA 81 mg a day       Informed Consent   Shared Decision Making/Informed Consent The risks [stroke (1 in 1000), death (1 in 1000), kidney failure [usually temporary] (1 in 500), bleeding (1 in 200), allergic reaction [possibly serious] (1 in 200)], benefits (diagnostic support and management of coronary artery disease) and alternatives of a cardiac catheterization were discussed in detail with Mr. Salas and he is willing to proceed.     Dispo: cardiac cath next week    Signed, Loistine Chance  Jonte Wollam, MD

## 2023-05-16 LAB — BASIC METABOLIC PANEL
BUN/Creatinine Ratio: 20 (ref 10–24)
BUN: 18 mg/dL (ref 8–27)
CO2: 26 mmol/L (ref 20–29)
Calcium: 9.8 mg/dL (ref 8.6–10.2)
Chloride: 105 mmol/L (ref 96–106)
Creatinine, Ser: 0.91 mg/dL (ref 0.76–1.27)
Glucose: 104 mg/dL — ABNORMAL HIGH (ref 70–99)
Potassium: 4.7 mmol/L (ref 3.5–5.2)
Sodium: 144 mmol/L (ref 134–144)
eGFR: 94 mL/min/{1.73_m2} (ref >59)

## 2023-05-16 LAB — CBC
Hematocrit: 49.5 % (ref 37.5–51.0)
Hemoglobin: 16 g/dL (ref 13.0–17.7)
MCH: 30.8 pg (ref 26.6–33.0)
MCHC: 32.3 g/dL (ref 31.5–35.7)
MCV: 95 fL (ref 79–97)
Platelets: 423 10*3/uL (ref 150–450)
RBC: 5.19 x10E6/uL (ref 4.14–5.80)
RDW: 12.9 % (ref 11.6–15.4)
WBC: 19.6 10*3/uL — ABNORMAL HIGH (ref 3.4–10.8)

## 2023-05-17 ENCOUNTER — Encounter: Payer: Self-pay | Admitting: Cardiovascular Disease

## 2023-05-18 ENCOUNTER — Encounter (HOSPITAL_COMMUNITY)
Admission: RE | Disposition: A | Discharge status: Home / Self Care | Payer: Self-pay | Source: Home / Self Care | Attending: Internal Medicine

## 2023-05-18 ENCOUNTER — Ambulatory Visit (HOSPITAL_COMMUNITY)
Admission: RE | Admit: 2023-05-18 | Discharge: 2023-05-18 | Disposition: A | Discharge status: Home / Self Care | Attending: Internal Medicine | Admitting: Internal Medicine

## 2023-05-18 ENCOUNTER — Other Ambulatory Visit: Payer: Self-pay

## 2023-05-18 DIAGNOSIS — Z87891 Personal history of nicotine dependence: Secondary | ICD-10-CM | POA: Insufficient documentation

## 2023-05-18 DIAGNOSIS — R7303 Prediabetes: Secondary | ICD-10-CM | POA: Diagnosis not present

## 2023-05-18 DIAGNOSIS — I2582 Chronic total occlusion of coronary artery: Secondary | ICD-10-CM | POA: Insufficient documentation

## 2023-05-18 DIAGNOSIS — E785 Hyperlipidemia, unspecified: Secondary | ICD-10-CM | POA: Diagnosis not present

## 2023-05-18 DIAGNOSIS — I10 Essential (primary) hypertension: Secondary | ICD-10-CM | POA: Diagnosis not present

## 2023-05-18 DIAGNOSIS — I2510 Atherosclerotic heart disease of native coronary artery without angina pectoris: Secondary | ICD-10-CM | POA: Insufficient documentation

## 2023-05-18 DIAGNOSIS — I2584 Coronary atherosclerosis due to calcified coronary lesion: Secondary | ICD-10-CM | POA: Insufficient documentation

## 2023-05-18 DIAGNOSIS — R9439 Abnormal result of other cardiovascular function study: Secondary | ICD-10-CM

## 2023-05-18 HISTORY — PX: LEFT HEART CATH AND CORONARY ANGIOGRAPHY: CATH118249

## 2023-05-18 LAB — CBC
HCT: 48.6 % (ref 39.0–52.0)
Hemoglobin: 16.2 g/dL (ref 13.0–17.0)
MCH: 31.8 pg (ref 26.0–34.0)
MCHC: 33.3 g/dL (ref 30.0–36.0)
MCV: 95.3 fL (ref 80.0–100.0)
Platelets: 378 10*3/uL (ref 150–400)
RBC: 5.1 MIL/uL (ref 4.22–5.81)
RDW: 13.2 % (ref 11.5–15.5)
WBC: 11.2 10*3/uL — ABNORMAL HIGH (ref 4.0–10.5)
nRBC: 0 % (ref 0.0–0.2)

## 2023-05-18 SURGERY — LEFT HEART CATH AND CORONARY ANGIOGRAPHY
Anesthesia: LOCAL

## 2023-05-18 MED ORDER — ONDANSETRON HCL 4 MG/2ML IJ SOLN: 4.0000 mg | Freq: Four times a day (QID) | INTRAMUSCULAR | Status: DC | PRN

## 2023-05-18 MED ORDER — SODIUM CHLORIDE 0.9 % WEIGHT BASED INFUSION: 3.0000 mL/kg/h | INTRAVENOUS | Status: AC

## 2023-05-18 MED ORDER — SODIUM CHLORIDE 0.9 % IV SOLN: INTRAVENOUS | Status: DC

## 2023-05-18 MED ORDER — HEPARIN SODIUM (PORCINE) 1000 UNIT/ML IJ SOLN
INTRAMUSCULAR | Status: AC
  Filled 2023-05-18: qty 10

## 2023-05-18 MED ORDER — HEPARIN SODIUM (PORCINE) 1000 UNIT/ML IJ SOLN
INTRAMUSCULAR | Status: DC | PRN
  Administered 2023-05-18: 4000 [IU] via INTRAVENOUS

## 2023-05-18 MED ORDER — ACETAMINOPHEN 325 MG PO TABS: 650.0000 mg | ORAL_TABLET | ORAL | Status: DC | PRN

## 2023-05-18 MED ORDER — LIDOCAINE HCL (PF) 1 % IJ SOLN
INTRAMUSCULAR | Status: AC
  Filled 2023-05-18: qty 30

## 2023-05-18 MED ORDER — SODIUM CHLORIDE 0.9 % IV SOLN: 250.0000 mL | INTRAVENOUS | Status: DC | PRN

## 2023-05-18 MED ORDER — ROSUVASTATIN CALCIUM 20 MG PO TABS
20.0000 mg | ORAL_TABLET | Freq: Every day | ORAL | 5 refills | Status: DC
Start: 2023-05-18 — End: 2023-11-03

## 2023-05-18 MED ORDER — MIDAZOLAM HCL 2 MG/2ML IJ SOLN
INTRAMUSCULAR | Status: DC | PRN
  Administered 2023-05-18: 1 mg via INTRAVENOUS

## 2023-05-18 MED ORDER — LIDOCAINE HCL (PF) 1 % IJ SOLN
INTRAMUSCULAR | Status: DC | PRN
  Administered 2023-05-18: 2 mL

## 2023-05-18 MED ORDER — MIDAZOLAM HCL 2 MG/2ML IJ SOLN
INTRAMUSCULAR | Status: AC
  Filled 2023-05-18: qty 2

## 2023-05-18 MED ORDER — LABETALOL HCL 5 MG/ML IV SOLN: 10.0000 mg | INTRAVENOUS | Status: DC | PRN

## 2023-05-18 MED ORDER — VERAPAMIL HCL 2.5 MG/ML IV SOLN
INTRAVENOUS | Status: DC | PRN
  Administered 2023-05-18: 10 mL via INTRA_ARTERIAL

## 2023-05-18 MED ORDER — SODIUM CHLORIDE 0.9% FLUSH: 3.0000 mL | Freq: Two times a day (BID) | INTRAVENOUS | Status: DC

## 2023-05-18 MED ORDER — VERAPAMIL HCL 2.5 MG/ML IV SOLN
INTRAVENOUS | Status: AC
  Filled 2023-05-18: qty 2

## 2023-05-18 MED ORDER — FENTANYL CITRATE (PF) 100 MCG/2ML IJ SOLN
INTRAMUSCULAR | Status: AC
  Filled 2023-05-18: qty 2

## 2023-05-18 MED ORDER — HEPARIN (PORCINE) IN NACL 1000-0.9 UT/500ML-% IV SOLN
INTRAVENOUS | Status: DC | PRN
  Administered 2023-05-18 (×2): 500 mL

## 2023-05-18 MED ORDER — METOPROLOL SUCCINATE ER 25 MG PO TB24: 12.5000 mg | ORAL_TABLET | Freq: Every day | ORAL | 5 refills | Status: AC

## 2023-05-18 MED ORDER — SODIUM CHLORIDE 0.9 % WEIGHT BASED INFUSION: 1.0000 mL/kg/h | INTRAVENOUS | Status: DC

## 2023-05-18 MED ORDER — IOHEXOL 350 MG/ML SOLN
INTRAVENOUS | Status: DC | PRN
  Administered 2023-05-18: 30 mL

## 2023-05-18 MED ORDER — HYDRALAZINE HCL 20 MG/ML IJ SOLN: 10.0000 mg | INTRAMUSCULAR | Status: DC | PRN

## 2023-05-18 MED ORDER — SODIUM CHLORIDE 0.9% FLUSH: 3.0000 mL | INTRAVENOUS | Status: DC | PRN

## 2023-05-18 MED ORDER — FENTANYL CITRATE (PF) 100 MCG/2ML IJ SOLN
INTRAMUSCULAR | Status: DC | PRN
  Administered 2023-05-18: 25 ug via INTRAVENOUS

## 2023-05-18 MED ORDER — ASPIRIN 81 MG PO CHEW: 81.0000 mg | CHEWABLE_TABLET | ORAL | Status: DC

## 2023-05-18 SURGICAL SUPPLY — 8 items
CATH 5FR JL3.5 JR4 ANG PIG MP (CATHETERS) IMPLANT
DEVICE RAD COMP TR BAND LRG (VASCULAR PRODUCTS) IMPLANT
GLIDESHEATH SLEND SS 6F .021 (SHEATH) IMPLANT
GUIDEWIRE INQWIRE 1.5J.035X260 (WIRE) IMPLANT
INQWIRE 1.5J .035X260CM (WIRE) ×1 IMPLANT
KIT SINGLE USE MANIFOLD (KITS) IMPLANT
PACK CARDIAC CATHETERIZATION (CUSTOM PROCEDURE TRAY) ×1 IMPLANT
SET ATX-X65L (MISCELLANEOUS) IMPLANT

## 2023-05-18 NOTE — Interval H&P Note (Signed)
 History and Physical Interval Note:  05/18/2023 10:35 AM  [name]  has presented today for surgery, with the diagnosis of coronary artery calcification and abnormal stress test.  The various methods of treatment have been discussed with the patient and family. After consideration of risks, benefits and other options for treatment, the patient has consented to  Procedure(s): LEFT HEART CATH AND CORONARY ANGIOGRAPHY (N/A) as a surgical intervention.  The patient's history has been reviewed, patient examined, no change in status, stable for surgery.  I have reviewed the patient's chart and labs.  Questions were answered to the patient's satisfaction.    Cath Lab Visit (complete for each Cath Lab visit)  Clinical Evaluation Leading to the Procedure:   ACS: No.  Non-ACS:    Anginal Classification: CCS I  Anti-ischemic medical therapy: No Therapy  Non-Invasive Test Results: High-risk stress test findings: cardiac mortality >3%/year  Prior CABG: No previous CABG  [name]

## 2023-05-18 NOTE — Brief Op Note (Signed)
 BRIEF CARDIAC CATHETERIZATION NOTE  05/18/2023  11:45 AM  PATIENT:  [name]  68 y.o. male  PRE-OPERATIVE DIAGNOSIS:  Abnormal stress test  POST-OPERATIVE DIAGNOSIS:  Multivessel CAD  PROCEDURE:  Procedure(s): LEFT HEART CATH AND CORONARY ANGIOGRAPHY (N/A)  SURGEON:  Surgeons and Role:    * Fawna Cranmer, MD - Primary  FINDINGS: Severe 2-vessel CAD with CTO's of proximal/mid LCx and proximal RCA. Normal LVEF.  RECOMMENDATIONS: Escalate statin therapy. Add metoprolol succinate for antianginal therapy.  Yvonne Kendall, MD [facility]

## 2023-05-19 ENCOUNTER — Encounter (HOSPITAL_COMMUNITY): Payer: Self-pay | Admitting: Internal Medicine

## 2023-05-25 ENCOUNTER — Encounter (HOSPITAL_BASED_OUTPATIENT_CLINIC_OR_DEPARTMENT_OTHER): Payer: Self-pay

## 2023-06-04 ENCOUNTER — Inpatient Hospital Stay: Attending: Oncology | Admitting: Oncology

## 2023-06-04 ENCOUNTER — Inpatient Hospital Stay

## 2023-06-04 VITALS — BP 113/69 | HR 61 | Temp 97.7°F | Resp 16 | Ht 68.0 in | Wt 176.9 lb

## 2023-06-04 DIAGNOSIS — Z7982 Long term (current) use of aspirin: Secondary | ICD-10-CM | POA: Diagnosis not present

## 2023-06-04 DIAGNOSIS — E785 Hyperlipidemia, unspecified: Secondary | ICD-10-CM | POA: Diagnosis not present

## 2023-06-04 DIAGNOSIS — R61 Generalized hyperhidrosis: Secondary | ICD-10-CM | POA: Insufficient documentation

## 2023-06-04 DIAGNOSIS — D72829 Elevated white blood cell count, unspecified: Secondary | ICD-10-CM | POA: Diagnosis not present

## 2023-06-04 DIAGNOSIS — I2510 Atherosclerotic heart disease of native coronary artery without angina pectoris: Secondary | ICD-10-CM | POA: Diagnosis not present

## 2023-06-04 DIAGNOSIS — Z79899 Other long term (current) drug therapy: Secondary | ICD-10-CM | POA: Diagnosis not present

## 2023-06-04 DIAGNOSIS — Z87891 Personal history of nicotine dependence: Secondary | ICD-10-CM | POA: Insufficient documentation

## 2023-06-04 NOTE — Progress Notes (Unsigned)
 [facility]  HEMATOLOGY CLINIC CONSULTATION NOTE    PATIENT NAME: [name]   MR#: [id number] DOB: [birth year]  DATE OF SERVICE: 06/04/2023  REFERRING PROVIDER:  Irena Reichmann, DO   Patient Care Team: Irena Reichmann, DO as PCP - General (Family Medicine) Jodelle Red, MD as PCP - Cardiology (Cardiology)  REASON FOR CONSULTATION/ CHIEF COMPLAINT:  Evaluation of leukocytosis.   ASSESSMENT & PLAN:  [name] is a 68 y.o. pleasant gentleman with past medical history of hyperlipidemia, prediabetes, degenerative joint disease, previous tobacco smoking, was referred to our clinic for further evaluation of leukocytosis.  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 Current count is 11,200, slightly above normal.   Differential diagnosis includes smoking-related inflammation, infection, or potential bone marrow disorder such as MPN. Normal red and platelet counts and the long-standing nature of leukocytosis make a bone marrow disorder less likely.   Smoking cessation may reduce inflammation and leukocytosis.  Could not obtain labs today.  Will arrange for lab appointment early next week and check for CBCD, CMP, LDH, ESR, CRP, JAK2 mutation analysis, flow cytometry of peripheral blood.   Since patient is complaining of night sweats, we will also check for iron studies, TSH to rule out other possible etiologies.  Clinical picture is not indicative of myeloproliferative neoplasm at this time.  Since patient quit smoking, we expect white count to improve slowly.  - Arrange a follow-up phone call in two weeks to discuss test results.  - Schedule an in-person follow-up appointment in three months.  Night sweats Severe night sweats requiring change of clothes and sheets, ongoing for approximately seven years. Potential causes include systemic inflammation due to smoking, thyroid  dysfunction, or other systemic conditions. Further evaluation is needed to determine the underlying cause. The night sweats may improve over time, especially with smoking cessation. - Evaluate thyroid function as part of the workup for leukocytosis. - Discuss potential causes and management of night sweats during follow-up call.  Quit smoking in March 2025 Successfully quit smoking four weeks ago after smoking for over 40 years. Smoking cessation may positively impact leukocytosis and overall health. Smoking is a potential cause of elevated white blood cell count due to inflammation. - Encourage continued smoking cessation.  I reviewed lab results and outside records for this visit and discussed relevant results with the patient. Diagnosis, plan of care and treatment options were also discussed in detail with the patient. Opportunity provided to ask questions and answers provided to his apparent satisfaction. Provided instructions to call our clinic with any problems, questions or concerns prior to return visit. I recommended to continue follow-up with PCP and sub-specialists. He verbalized understanding and agreed with the plan. No barriers to learning was detected.  Meryl Crutch, MD  06/04/2023 5:11 PM  [facility] - A DEPT OF [facility] [name] [place] [address] Dept: [phone] Dept Fax: [phone]   HISTORY OF PRESENTING ILLNESS:   [name] is a 68 y.o. pleasant gentleman with past medical history of hyperlipidemia, prediabetes, degenerative joint disease, previous tobacco smoking, was referred to our clinic for further evaluation of leukocytosis.  Discussed the use of AI scribe software for clinical note transcription with the patient, who gave verbal consent to proceed.   On 05/15/2023, labs showed white count of 19,600.  No differential was obtained.  Hemoglobin and platelet count were  within normal  limits.  He was referred to [place] for further evaluation of leukocytosis.  On review of records, he has had chronic, intermittent leukocytosis with white count ranging anywhere between 13,000 to 19,000 since 2018 at least.  He denies recent infection. The last prescription antibiotics was more than 3 months ago.   Patient denies sinus congestion, cough, urinary frequency/urgency or dysuria, diarrhea, joint swelling/pain or abnormal skin rash.   The patient reports experiencing severe night sweats for the past seven years, which are so intense that he often requires changing clothes and sheets. The patient denies any fever and reports a good appetite. The patient quit smoking four weeks ago upon medical advice. The patient denies any history of steroid use.  He had no prior history or diagnosis of cancer. His age appropriate screening programs are up-to-date.  The patient has no prior diagnosis of autoimmune disease and was not prescribed corticosteroids related products.  MEDICAL HISTORY:  Past Medical History:  Diagnosis Date   Current smoker    Degenerative cervical disc    Depression    ED (erectile dysfunction)    Elevated PSA    GERD (gastroesophageal reflux disease)    HLD (hyperlipidemia)    Prediabetes    Spinal stenosis     SURGICAL HISTORY: Past Surgical History:  Procedure Laterality Date   LEFT HEART CATH AND CORONARY ANGIOGRAPHY N/A 05/18/2023   Procedure: LEFT HEART CATH AND CORONARY ANGIOGRAPHY;  Surgeon: Yvonne Kendall, MD;  Location: [facility] INV[facility]IVE [facility];  Service: Cardiovascular;  Laterality: N/A;   MANDIBLE FRACTURE SURGERY     SPLENECTOMY     UMBILICAL HERNIA REPAIR      SOCIAL HISTORY: He reports that he has been smoking cigarettes. He has never used smokeless tobacco. He reports current alcohol use. He reports that he does not use drugs. Social History   Socioeconomic History   Marital status: Married    Spouse name: Not on file   Number of children: Not  on file   Years of education: Not on file   Highest education level: Not on file  Occupational History   Not on file  Tobacco Use   Smoking status: Every Day    Types: Cigarettes   Smokeless tobacco: Never  Substance and Sexual Activity   Alcohol use: Yes   Drug use: No   Sexual activity: Not on file  Other Topics Concern   Not on file  Social History Narrative   Not on file   Social Drivers of Health   Financial Resource Strain: Not on file  Food Insecurity: No Food Insecurity (04/08/2023)   Hunger Vital Sign    Worried About Running Out of Food in the Last Year: Never true    Ran Out of Food in the Last Year: Never true  Transportation Needs: No Transportation Needs (04/08/2023)   PRAPARE - Administrator, Civil Service (Medical): No    Lack of Transportation (Non-Medical): No  Physical Activity: Unknown (04/08/2023)   Exercise Vital Sign    Days of Exercise per Week: 2 days    Minutes of Exercise per Session: Not on file  Stress: Not on file  Social Connections: Moderately Integrated (04/08/2023)   Social Connection and Isolation Panel [NHANES]    Frequency of Communication with Friends and Family: Twice a week    Frequency of Social Gatherings with Friends and Family: Once a week    Attends Religious Services: Never    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  MEDICATIONS:  Current Outpatient Medications   Medication Sig Dispense Refill   aspirin EC 81 MG tablet Take 1 tablet (81 mg total) by mouth daily. Swallow whole. (Patient taking differently: Take 81 mg by mouth at bedtime. Swallow whole.)     cholecalciferol (VITAMIN D3) 25 MCG (1000 UNIT) tablet Take 1,000 Units by mouth at bedtime.     diazepam (VALIUM) 10 MG tablet Take 10 mg by mouth at bedtime.     DULoxetine (CYMBALTA) 60 MG capsule Take 60 mg by mouth every evening.     ezetimibe (ZETIA) 10 MG tablet Take 1 tablet (10 mg total) by mouth daily. (Patient taking differently: Take 10 mg by mouth at bedtime.) 90 tablet 3   Ferrous Sulfate (IRON PO) Take 1 tablet by mouth every other day.     FIBER GUMMIES PO Take 2 capsules by mouth at bedtime.     methocarbamol (ROBAXIN) 750 MG tablet Take 750 mg by mouth 2 (two) times daily as needed for muscle spasms.     metoprolol succinate (TOPROL XL) 25 MG 24 hr tablet Take 0.5 tablets (12.5 mg total) by mouth daily. 30 tablet 5   Multiple Vitamin tablet Take 1 tablet by mouth at bedtime.     pantoprazole (PROTONIX) 40 MG tablet Take 40 mg by mouth at bedtime.     rosuvastatin (CRESTOR) 20 MG tablet Take 1 tablet (20 mg total) by mouth at bedtime. 30 tablet 5   sertraline (ZOLOFT) 100 MG tablet Take 200 mg by mouth at bedtime.  0   No current facility-administered medications for this visit.    REVIEW OF SYSTEMS:    Review of Systems  Constitutional:        Night sweats for at least 7 years  Cardiovascular:  Positive for leg swelling.  Musculoskeletal:  Positive for arthralgias, back pain and neck pain.  Neurological:  Positive for numbness.    All other pertinent systems were reviewed and were negative except as mentioned above.  PHYSICAL EXAMINATION:    Onc Performance Status - 06/04/23 1554       ECOG Perf Status   ECOG Perf Status Restricted in physically strenuous activity but ambulatory and able to carry out work of a light or sedentary nature, e.g., light house work, office  work      KPS SCALE   KPS % SCORE Normal activity with effort, some s/s of disease              Vitals:   06/04/23 1537  BP: 113/69  Pulse: 61  Resp: 16  Temp: 97.7 F (36.5 C)  SpO2: 98%   Filed Weights   06/04/23 1537  Weight: 176 lb 14.4 oz (80.2 kg)    Physical  Exam Constitutional:      General: He is not in acute distress.    Appearance: Normal appearance.  HENT:     Head: Normocephalic and atraumatic.  Eyes:     General: No scleral icterus.    Conjunctiva/sclera: Conjunctivae normal.  Cardiovascular:     Rate and Rhythm: Normal rate and regular rhythm.     Heart sounds: Normal heart sounds.  Pulmonary:     Effort: Pulmonary effort is normal.     Breath sounds: Normal breath sounds.  Abdominal:     General: There is no distension.     Palpations: Abdomen is soft. There is no mass.  Musculoskeletal:     Right lower leg: No edema.     Left lower leg: No edema.  Lymphadenopathy:     Cervical: No cervical adenopathy.  Neurological:     General: No focal deficit present.     Mental Status: He is alert and oriented to person, place, and time.  Psychiatric:        Mood and Affect: Mood normal.        Behavior: Behavior normal.        Thought Content: Thought content normal.      LABORATORY DATA:   I have reviewed the data as listed.  No results found for any visits on 06/04/23.  Lab Results  Component Value Date   WBC 11.2 (H) 05/18/2023   HGB 16.2 05/18/2023   HCT 48.6 05/18/2023   MCV 95.3 05/18/2023   PLT 378 05/18/2023    RADIOGRAPHIC STUDIES:  I have personally reviewed the radiological images as listed and agree with the findings in the report.  CT CARDIAC SCORING (SELF PAY ONLY) Addendum Date: 05/30/2023 ADDENDUM REPORT: 05/30/2023 00:53 EXAM: OVER-READ INTERPRETATION  CT CHEST The following report is an over-read performed by radiologist Dr. Alcide Clever of [facility] on 05/30/2023. This over-read does not include  interpretation of cardiac or coronary anatomy or pathology. The coronary calcium score interpretation by the cardiologist is attached. COMPARISON:  None. FINDINGS: Cardiovascular: Aortic atherosclerotic calcifications are noted. Mediastinum/Nodes: There are no enlarged lymph nodes within the visualized mediastinum. Lungs/Pleura: There is no pleural effusion. The visualized lungs appear clear. Upper abdomen: No significant findings in the visualized upper abdomen. Musculoskeletal/Chest wall: No chest wall mass or suspicious osseous findings within the visualized chest. IMPRESSION: No significant extracardiac findings within the visualized chest. Electronically Signed   By: Alcide Clever M.D.   On: 05/30/2023 00:53   Result Date: 05/30/2023 CLINICAL DATA:  Cardiovascular Disease Risk stratification EXAM: Coronary Calcium Score TECHNIQUE: A gated, non-contrast computed tomography scan of the heart was performed using 3mm slice thickness. Axial images were analyzed on a dedicated workstation. Calcium scoring of the coronary arteries was performed using the Agatston method. MEDICATIONS: MEDICATIONS None FINDINGS: Coronary arteries: Normal origins. Coronary Calcium Score: Left main: 369 Left anterior descending artery: 2010 Left circumflex artery: 303 Right coronary artery: 506 Total: 3188 Percentile: 99th Pericardium: Normal. Ascending Aorta: Mildly dilated at 39 mm. Non-cardiac: See separate report from [facility]. IMPRESSION: Coronary calcium score of 3188 Agatston units. This was 99th percentile for age-, race-, and sex-matched controls. RECOMMENDATIONS: Coronary artery calcium (CAC) score is a strong predictor of incident coronary heart disease (CHD) and provides predictive information beyond traditional risk factors. CAC scoring is reasonable to use in the decision to withhold, postpone, or initiate statin therapy in intermediate-risk or selected borderline-risk asymptomatic adults (age 63-75 years and  LDL-C >=70 to <190  mg/dL) who do not have diabetes or established atherosclerotic cardiovascular disease (ASCVD).* In intermediate-risk (10-year ASCVD risk >=7.5% to <20%) adults or selected borderline-risk (10-year ASCVD risk >=5% to <7.5%) adults in whom a CAC score is measured for the purpose of making a treatment decision the following recommendations have been made: If CAC=0, it is reasonable to withhold statin therapy and reassess in 5 to 10 years, as long as higher risk conditions are absent (diabetes mellitus, family history of premature CHD in first degree relatives (males <55 years; females <65 years), cigarette smoking, or LDL >=190 mg/dL). If CAC is 1 to 99, it is reasonable to initiate statin therapy for patients >=26 years of age. If CAC is >=100 or >=75th percentile, it is reasonable to initiate statin therapy at any age. Cardiology referral should be considered for patients with CAC scores >=400 or >=75th percentile. *2018 AHA/ACC/AACVPR/AAPA/ABC/ACPM/ADA/AGS/APhA/ASPC/NLA/PCNA Guideline on the Management of Blood Cholesterol: A Report of the American College of Cardiology/American Heart Association Task Force on Clinical Practice Guidelines. J Am Coll Cardiol. 2019;73(24):3168-3209. Marca Ancona, MD Electronically Signed: By: Marca Ancona M.D. On: 05/11/2023 21:39   CARDIAC CATHETERIZATION Result Date: 05/18/2023 Conclusions: Severe two-vessel coronary artery disease with chronic total occlusions of proximal/mid LCx and RCA. Minimal luminal irregularities notes in LAD and large OM1 branch. Normal left ventricular systolic function (LVE 55-65%) and filling pressure (LVEDP 11 mmHg).  Recommendation: Escalate statin therapy. Add metoprolol succinate for antianginal therapy and treatment of frequent supraventricular ectopy noted during catheterization and on post-cath EKG. Consider hematologic workup of chronic leukocytosis. If fatigue/diaphoresis continues following optimization of antianginal  therapy and evaluation for other potential causes of symptoms, consultation with CTO team to discuss revascularization of RCA and/or LCx could be considered. Yvonne Kendall, MD [facility]  MYOCARDIAL PERFUSION IMAGING Result Date: 05/14/2023   Findings are consistent with ischemia and infarction. The study is high risk.   No ST deviation was noted.   LV perfusion is abnormal. Defect 1: There is a large defect with mild reduction in uptake present in the apical to basal inferior and inferoseptal location(s) that is fixed. There is abnormal wall motion in the defect area. Consistent with infarction.   Left ventricular function is abnormal. Global function is mildly reduced. Nuclear stress EF: 44%. The left ventricular ejection fraction is moderately decreased (30-44%). End diastolic cavity size is moderately enlarged. End systolic cavity size is moderately enlarged.   Prior study not available for comparison. Inferior infarct pattern but with global hypokinesis.  Visual and quantitative TID- this is a high risk finding and could suggest balanced ischemia.  Consider additional ischemic testing.    I spent a total of 55 minutes during this encounter with the patient including review of chart and various tests results, discussions about plan of care and coordination of care plan.  Orders Placed This Encounter  Procedures   CBC with Differential (Cancer Center Only)    Standing Status:   Future    Expiration Date:   06/03/2024   CMP (Cancer Center only)    Standing Status:   Future    Expiration Date:   06/03/2024   Sedimentation rate    Standing Status:   Future    Expiration Date:   06/03/2024   C-reactive protein    Standing Status:   Future    Expiration Date:   06/03/2024   Lactate dehydrogenase    Standing Status:   Future    Expiration Date:   06/03/2024   BCR-ABL1 FISH  Standing Status:   Future    Expiration Date:   06/03/2024   JAK2 V617F rfx CALR/MPL/E12-15    Standing Status:    Future    Expiration Date:   06/03/2024   Flow Cytometry, Peripheral Blood (Oncology)    Standing Status:   Future    Expiration Date:   06/03/2024   TSH    Standing Status:   Future    Expiration Date:   06/03/2024   Iron and Iron Binding Capacity ([facility] only)    Standing Status:   Future    Expiration Date:   06/03/2024   Ferritin    Standing Status:   Future    Expiration Date:   06/03/2024     Future Appointments  Date Time Provider Department Center  06/10/2023  8:50 AM Swinyer, Zachary George, NP [facility]      This document was completed utilizing speech recognition software. Grammatical errors, random word insertions, pronoun errors, and incomplete sentences are an occasional consequence of this system due to software limitations, ambient noise, and hardware issues. Any formal questions or concerns about the content, text or information contained within the body of this dictation should be directly addressed to the provider for clarification.

## 2023-06-05 ENCOUNTER — Telehealth: Payer: Self-pay | Admitting: Oncology

## 2023-06-05 ENCOUNTER — Encounter: Payer: Self-pay | Admitting: Oncology

## 2023-06-05 DIAGNOSIS — Z87891 Personal history of nicotine dependence: Secondary | ICD-10-CM | POA: Insufficient documentation

## 2023-06-05 DIAGNOSIS — D72829 Elevated white blood cell count, unspecified: Secondary | ICD-10-CM | POA: Insufficient documentation

## 2023-06-05 DIAGNOSIS — R61 Generalized hyperhidrosis: Secondary | ICD-10-CM | POA: Insufficient documentation

## 2023-06-05 LAB — NMR, LIPOPROFILE
Cholesterol, Total: 163 mg/dL (ref 100–199)
HDL Particle Number: 30.3 umol/L — ABNORMAL LOW (ref >=30.5)
HDL-C: 36 mg/dL — ABNORMAL LOW (ref >39)
LDL Particle Number: 1376 nmol/L — ABNORMAL HIGH (ref <1000)
LDL Size: 21.3 nm (ref >20.5)
LDL-C (NIH Calc): 102 mg/dL — ABNORMAL HIGH (ref 0–99)
LP-IR Score: 47 — ABNORMAL HIGH (ref <=45)
Small LDL Particle Number: 626 nmol/L — ABNORMAL HIGH (ref <=527)
Triglycerides: 143 mg/dL (ref 0–149)

## 2023-06-05 LAB — ALT: ALT: 17 IU/L (ref 0–44)

## 2023-06-05 NOTE — Progress Notes (Signed)
 Cardiology Office Note:  .   Date:  06/10/2023 ID:  [name], DOB Jul 17, 1955, MRN [id number] PCP: Irena Reichmann, DO [facility] Providers Cardiologist:  None   Patient Profile: .      PMH Coronary artery disease LHC 05/18/23 Severe 2 vessel CAD with chronic total occlusions of proximal/mid LCx and RCA Minimal luminal irregularities in LAD and large OM1 branch Normal LVEDP Normal LVEF Former tobacco abuse Quit smoking May 15 2023 Hyperlipidemia GERD Pre-diabetes Pancreatitis  Aortic atherosclerosis Emphysema Lactose intolerance Chronic leukocystosis  Referred to cardiology and seen by me on 04/08/2023 as a new patient for coronary artery calcification seen on lung cancer screening CT. 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 Was told he had a heart artery blockage about a year ago, seen on abdominal CT. He underwent recent lung scan which also revealed three-vessel coronary atherosclerosis. He was asymptomatic with cycling 3 miles on stationary bike. Wife is concerned about his diet and they have both been trying to eat better and get more exercise. He has a preference for snacks and large bowls of cereal (frosted mini-wheats with 2% milk). Unfortunately, he continues to smoke half a pack a day from a pack a day. His wife has recently quit smoking after being diagnosed with COPD. He reported excessive sweating at night, a condition that has persisted for several years but no diaphoresis with exertion. Has been on cholesterol medicine for a long time. Family history is significant for his mother who has high cholesterol and CAD. Unfortunately his father died from lung cancer.  He is unsure whether there was any evidence of heart disease. ASCVD risk score 16.3%.   CT Calcium score was ordered for risk stratification and revealed significantly elevated CAC score 3188 (99th percentile). Due to  this score, he underwent lexiscan myoview 05/14/2023 that revealed large defect in apical to basal inferior and inferoseptal location consistent with infarction, felt to represent inferior infarct pattern with global hypokinesis, visually and quantitative felt to represent balanced ischemia. Due to abnormal result, he was seen by Dr. Elease Hashimoto 05/15/23 for consideration of cardiac cath. He reported physical limitations felt to limit his exertion. He was scheduled for [facility] 05/18/23 which revealed severe 2 vessel CAD with chronic total occlusions of prox/mid LCx and  RCA, minimal luminal irregularities in LAD and large OM1 branch, normal LVEDP and LVEF.    He was referred to hem/onc for evaluation of leukocytosis.        History of Present Illness: .   [name] is a very pleasant 68 y.o. male  who is here today for follow-up of CAD. He is here today with his wife. Reports he is feeling a little sluggish since the cardiac cath.  We reviewed cath results in detail and all questions were answered to his satisfaction.  He remains active with stationary cycling and water aerobics at the [facility]. His wife has been encouraging him to lift weights, but he has been hesitant to start.  He is working on dietary changes to reduce intake of saturated fat, reduce portion sizes, and reduce salt and sugar.  He denies chest pain, shortness of breath, lower extremity edema, palpitations, melena, presyncope, syncope, orthopnea, and PND.   ROS: See HPI       Studies Reviewed: .          Risk Assessment/Calculations:  Physical Exam:   VS: BP 106/70 (BP Location: Right Arm, Patient Position: Sitting, Cuff Size: Normal)   Pulse 62   Ht 5\' 9"  (1.753 m)   Wt 179 lb 1.6 oz (81.2 kg)   BMI 26.45 kg/m   Wt Readings from Last 3 Encounters:  06/10/23 179 lb 1.6 oz (81.2 kg)  06/04/23 176 lb 14.4 oz (80.2 kg)  05/18/23 170 lb (77.1 kg)     GEN: Well nourished, well developed in no acute distress NECK: No JVD;  No carotid bruits CARDIAC: RRR, no murmurs, rubs, gallops RESPIRATORY:  Clear to auscultation without rales, wheezing or rhonchi  ABDOMEN: Soft, non-tender, non-distended EXTREMITIES:  No edema; No deformity     ASSESSMENT AND PLAN: .    Coronary artery disease: LHC 05/18/23 revealed chronic total occlusions of proximal/mid LCx and RCA, minimal irregularities LAD and OM1 recommended for medical Rx. Review of images by Dr. [place] with advisement that not a likely candidate for CTO PCI, recommend medical Rx. Metoprolol was added 05/18/23 and pt reports he has been feeling a little fatigued since that time.  He is working out consistently and denies chest pain, shortness of breath, palpitations, or other symptoms concerning for angina.  Reviewed indications for medical therapy and encouraged him to continue to monitor and report if fatigue does not improve. No bleeding concerns.  Continue aspirin, ezetimibe, metoprolol, rosuvastatin.  Advised he may hold aspirin for 5 to 7 days for upcoming spinal injection. Focus on secondary prevention including heart healthy mostly plant based diet avoiding saturated fat, processed foods, simple carbohydrates, and sugar along with aiming for at least 150 minutes of moderate intensity exercise each week.   Fatigue: Notes increased fatigue since [facility] 05/18/23. Advised this may be due to the addition of metoprolol. Encouraged him to continue to monitor and report persistent concerns.  He is already taking metoprolol at night.  Could consider low-dose bisoprolol or nebivolol in place of metoprolol if symptoms persist.   Hyperlipidemia LDL goal < 55: NMR lipid profile 06/04/2023 with LDL particle number 1376, LDL-C 102, HDL-C 36, triglycerides 143, total cholesterol 163, and small LDL-P 626.  Improvement from LDL 1 05 January 2023.  Nexletol was prescribed but not covered by insurance.  He was required to try ezetimibe 10 mg daily first.  This was started in addition to  rosuvastatin 20 mg daily, however he has not demonstrated improvement to goal.  We will retry Nexletol to see if it will be covered by insurance. Consider PCSK9i if bempedoic acid is cost prohibitive. He is working on eating a healthier diet avoiding saturated fat, sugar and salt and has recently increased his physical activity.   Leukocytosis: Seen by hematology 06/04/23, awaiting review of lab results. No acute concerns today. Management per hematologist.   Tobacco abuse: He quit smoking May 15, 2023. I congratulated him on this achievement.         Disposition: 6 months with me or Dr. Cristal Deer  Signed, Eligha Bridegroom, NP-C

## 2023-06-05 NOTE — Telephone Encounter (Signed)
 Lab closed before patient could be seen on 3/27. Per MD's request I rescheduled patient's lab appt. Patient is aware of appt details. I called back to leave a voicemail for the telephone visit scheduled.

## 2023-06-05 NOTE — Assessment & Plan Note (Signed)
 Successfully quit smoking four weeks ago after smoking for over 40 years. Smoking cessation may positively impact leukocytosis and overall health. Smoking is a potential cause of elevated white blood cell count due to inflammation. - Encourage continued smoking cessation.

## 2023-06-05 NOTE — Assessment & Plan Note (Signed)
 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 Current count is 11,200, slightly above normal.   Differential diagnosis includes smoking-related inflammation, infection, or potential bone marrow disorder such as MPN. Normal red and platelet counts and the long-standing nature of leukocytosis make a bone marrow disorder less likely.   Smoking cessation may reduce inflammation and leukocytosis.  Could not obtain labs today.  Will arrange for lab appointment early next week and check for CBCD, CMP, LDH, ESR, CRP, JAK2 mutation analysis, flow cytometry of peripheral blood.   Since patient is complaining of night sweats, we will also check for iron studies, TSH to rule out other possible etiologies.  Clinical picture is not indicative of myeloproliferative neoplasm at this time.  Since patient quit smoking, we expect white count to improve slowly.  - Arrange a follow-up phone call in two weeks to discuss test results.  - Schedule an in-person follow-up appointment in three months.

## 2023-06-05 NOTE — Assessment & Plan Note (Signed)
 Severe night sweats requiring change of clothes and sheets, ongoing for approximately seven years. Potential causes include systemic inflammation due to smoking, thyroid dysfunction, or other systemic conditions. Further evaluation is needed to determine the underlying cause. The night sweats may improve over time, especially with smoking cessation. - Evaluate thyroid function as part of the workup for leukocytosis. - Discuss potential causes and management of night sweats during follow-up call.

## 2023-06-05 NOTE — Telephone Encounter (Signed)
 Completed - Glenda scheduled appointments, and is aware of all appointment details.

## 2023-06-05 NOTE — Telephone Encounter (Signed)
 Left Message - Scheduled telephone appt.

## 2023-06-08 ENCOUNTER — Inpatient Hospital Stay

## 2023-06-08 DIAGNOSIS — Z7982 Long term (current) use of aspirin: Secondary | ICD-10-CM | POA: Diagnosis not present

## 2023-06-08 DIAGNOSIS — R61 Generalized hyperhidrosis: Secondary | ICD-10-CM | POA: Diagnosis not present

## 2023-06-08 DIAGNOSIS — D72829 Elevated white blood cell count, unspecified: Secondary | ICD-10-CM | POA: Diagnosis not present

## 2023-06-08 DIAGNOSIS — Z79899 Other long term (current) drug therapy: Secondary | ICD-10-CM | POA: Diagnosis not present

## 2023-06-08 DIAGNOSIS — Z87891 Personal history of nicotine dependence: Secondary | ICD-10-CM | POA: Diagnosis not present

## 2023-06-08 LAB — CBC WITH DIFFERENTIAL (CANCER CENTER ONLY)
Abs Immature Granulocytes: 0.02 10*3/uL (ref 0.00–0.07)
Basophils Absolute: 0.1 10*3/uL (ref 0.0–0.1)
Basophils Relative: 1 %
Eosinophils Absolute: 0.4 10*3/uL (ref 0.0–0.5)
Eosinophils Relative: 4 %
HCT: 45.7 % (ref 39.0–52.0)
Hemoglobin: 15.4 g/dL (ref 13.0–17.0)
Immature Granulocytes: 0 %
Lymphocytes Relative: 27 %
Lymphs Abs: 2.5 10*3/uL (ref 0.7–4.0)
MCH: 31.4 pg (ref 26.0–34.0)
MCHC: 33.7 g/dL (ref 30.0–36.0)
MCV: 93.3 fL (ref 80.0–100.0)
Monocytes Absolute: 0.9 10*3/uL (ref 0.1–1.0)
Monocytes Relative: 10 %
Neutro Abs: 5.5 10*3/uL (ref 1.7–7.7)
Neutrophils Relative %: 58 %
Platelet Count: 370 10*3/uL (ref 150–400)
RBC: 4.9 MIL/uL (ref 4.22–5.81)
RDW: 13.3 % (ref 11.5–15.5)
WBC Count: 9.4 10*3/uL (ref 4.0–10.5)
nRBC: 0 % (ref 0.0–0.2)

## 2023-06-08 LAB — C-REACTIVE PROTEIN: CRP: 0.6 mg/dL (ref <1.0)

## 2023-06-08 LAB — CMP (CANCER CENTER ONLY)
ALT: 17 U/L (ref 0–44)
AST: 18 U/L (ref 15–41)
Albumin: 4.2 g/dL (ref 3.5–5.0)
Alkaline Phosphatase: 83 U/L (ref 38–126)
Anion gap: 7 (ref 5–15)
BUN: 15 mg/dL (ref 8–23)
CO2: 27 mmol/L (ref 22–32)
Calcium: 9.5 mg/dL (ref 8.9–10.3)
Chloride: 106 mmol/L (ref 98–111)
Creatinine: 0.76 mg/dL (ref 0.61–1.24)
GFR, Estimated: >60 mL/min (ref >60)
Glucose, Bld: 166 mg/dL — ABNORMAL HIGH (ref 70–99)
Potassium: 4.1 mmol/L (ref 3.5–5.1)
Sodium: 140 mmol/L (ref 135–145)
Total Bilirubin: 0.4 mg/dL (ref 0.0–1.2)
Total Protein: 7.6 g/dL (ref 6.5–8.1)

## 2023-06-08 LAB — SEDIMENTATION RATE: Sed Rate: 16 mm/h (ref 0–16)

## 2023-06-08 LAB — SURGICAL PATHOLOGY

## 2023-06-08 LAB — IRON AND IRON BINDING CAPACITY (CC-WL,HP ONLY)
Iron: 108 ug/dL (ref 45–182)
Saturation Ratios: 28 % (ref 17.9–39.5)
TIBC: 382 ug/dL (ref 250–450)
UIBC: 274 ug/dL (ref 117–376)

## 2023-06-08 LAB — TSH: TSH: 3.444 u[IU]/mL (ref 0.350–4.500)

## 2023-06-08 LAB — FERRITIN: Ferritin: 58 ng/mL (ref 24–336)

## 2023-06-08 LAB — LACTATE DEHYDROGENASE: LDH: 124 U/L (ref 98–192)

## 2023-06-09 DIAGNOSIS — M48061 Spinal stenosis, lumbar region without neurogenic claudication: Secondary | ICD-10-CM | POA: Diagnosis not present

## 2023-06-09 DIAGNOSIS — M5412 Radiculopathy, cervical region: Secondary | ICD-10-CM | POA: Diagnosis not present

## 2023-06-09 DIAGNOSIS — M47812 Spondylosis without myelopathy or radiculopathy, cervical region: Secondary | ICD-10-CM | POA: Diagnosis not present

## 2023-06-09 DIAGNOSIS — M47816 Spondylosis without myelopathy or radiculopathy, lumbar region: Secondary | ICD-10-CM | POA: Diagnosis not present

## 2023-06-10 ENCOUNTER — Encounter (HOSPITAL_BASED_OUTPATIENT_CLINIC_OR_DEPARTMENT_OTHER): Payer: Self-pay | Admitting: Nurse Practitioner

## 2023-06-10 ENCOUNTER — Ambulatory Visit (HOSPITAL_BASED_OUTPATIENT_CLINIC_OR_DEPARTMENT_OTHER): Payer: Medicare HMO | Admitting: Nurse Practitioner

## 2023-06-10 VITALS — BP 106/70 | HR 62 | Ht 69.0 in | Wt 179.1 lb

## 2023-06-10 DIAGNOSIS — D72829 Elevated white blood cell count, unspecified: Secondary | ICD-10-CM | POA: Diagnosis not present

## 2023-06-10 DIAGNOSIS — R5383 Other fatigue: Secondary | ICD-10-CM | POA: Diagnosis not present

## 2023-06-10 DIAGNOSIS — I2510 Atherosclerotic heart disease of native coronary artery without angina pectoris: Secondary | ICD-10-CM | POA: Diagnosis not present

## 2023-06-10 DIAGNOSIS — F17201 Nicotine dependence, unspecified, in remission: Secondary | ICD-10-CM

## 2023-06-10 DIAGNOSIS — E785 Hyperlipidemia, unspecified: Secondary | ICD-10-CM | POA: Diagnosis not present

## 2023-06-10 LAB — FLOW CYTOMETRY

## 2023-06-10 NOTE — Patient Instructions (Addendum)
 Medication Instructions:   Your physician recommends that you continue on your current medications as directed. Please refer to the Current Medication list given to you today.   *If you need a refill on your cardiac medications before your next appointment, please call your pharmacy*  Lab Work:  None ordered.  If you have labs (blood work) drawn today and your tests are completely normal, you will receive your results only by: MyChart Message (if you have MyChart) OR A paper copy in the mail If you have any lab test that is abnormal or we need to change your treatment, we will call you to review the results.  Testing/Procedures:  None ordered.  Follow-Up: At [facility], you and your health needs are our priority.  As part of our continuing mission to provide you with exceptional heart care, our providers are all part of one team.  This team includes your primary Cardiologist (physician) and Advanced Practice Providers or APPs (Physician Assistants and Nurse Practitioners) who all work together to provide you with the care you need, when you need it.  Your next appointment:   6 month(s)  Provider:   Jodelle Red, MD or Eligha Bridegroom, NP    We recommend signing up for the patient portal called "MyChart".  Sign up information is provided on this After Visit Summary.  MyChart is used to connect with patients for Virtual Visits (Telemedicine).  Patients are able to view lab/test results, encounter notes, upcoming appointments, etc.  Non-urgent messages can be sent to your provider as well.   To learn more about what you can do with MyChart, go to [email or web address].

## 2023-06-11 LAB — BCR-ABL1 FISH
Cells Analyzed: 200
Cells Counted: 200

## 2023-06-13 LAB — CALR +MPL + E12-E15  (REFLEX)

## 2023-06-13 LAB — JAK2 V617F RFX CALR/MPL/E12-15

## 2023-06-17 DIAGNOSIS — M5412 Radiculopathy, cervical region: Secondary | ICD-10-CM | POA: Diagnosis not present

## 2023-06-18 ENCOUNTER — Encounter: Payer: Self-pay | Admitting: Oncology

## 2023-06-18 ENCOUNTER — Inpatient Hospital Stay: Attending: Oncology | Admitting: Oncology

## 2023-06-18 DIAGNOSIS — R61 Generalized hyperhidrosis: Secondary | ICD-10-CM | POA: Diagnosis not present

## 2023-06-18 DIAGNOSIS — D72829 Elevated white blood cell count, unspecified: Secondary | ICD-10-CM | POA: Diagnosis not present

## 2023-06-18 NOTE — Progress Notes (Signed)
 [facility]  HEMATOLOGY-ONCOLOGY ELECTRONIC VISIT PROGRESS NOTE  PATIENT NAME: [name]   MR#: [id number] DOB: May 17, 1955  DATE OF SERVICE: 06/18/2023  Patient Care Team: Irena Reichmann, DO as PCP - General (Family Medicine) Jodelle Red, MD as PCP - Cardiology (Cardiology)  I connected with the patient via telephone conference and verified that I am speaking with the correct person using two identifiers. The patient's location is at home and I am providing care from the [facility].  I discussed the limitations, risks, security and privacy concerns of performing an evaluation and management service by e-visits and the availability of in person appointments.  I also discussed with the patient that there may be a patient responsible charge related to this service. The patient expressed understanding and agreed to proceed.   ASSESSMENT & PLAN:   Bessie Livingood is a 68 y.o. pleasant gentleman with past medical history of hyperlipidemia, prediabetes, degenerative joint disease, previous tobacco smoking, was referred to our clinic in March 2025 for further evaluation of leukocytosis.   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 Current count is 11,200, slightly above normal.   Smoking cessation may reduce inflammation and leukocytosis.  After his consultation with [place], labs on 06/08/2023 showed normal white count of 9400 with normal differential.  Hemoglobin normal at 15.4, platelet count normal at 370,000.  CMP was unremarkable except for glucose 166.  LDH, sed rate, CRP, TSH, iron studies were all within normal limits.  BCR/ABL 1 was negative.  JAK2 mutation analysis with reflex testing to include CALR, MPL, exon 12-15 mutations were all negative.  Flow cytometry of peripheral blood was also unremarkable.   Clinical picture is not indicative of myeloproliferative neoplasm.  Since patient  quit smoking, we expect white count to remain stable.  Schedule an in-person follow-up appointment in three months.  Night sweats Severe night sweats requiring change of clothes and sheets, ongoing for approximately seven years. Potential causes include systemic inflammation due to smoking, thyroid dysfunction, or other systemic conditions. Further evaluation is needed to determine the underlying cause. The night sweats may improve over time, especially with smoking cessation.  TSH and iron studies were normal on 06/08/2023.  Previously he had CT chest, abdomen and pelvis in 2023 and 2024 which showed no findings to suggest lymphoma or other malignancy.  If symptoms were to persist on return visit, we will check testosterone, PSA and also consider pursuing repeat imaging studies.   I discussed the assessment and treatment plan with the patient. The patient was provided an opportunity to ask questions and all were answered. The patient agreed with the plan and demonstrated an understanding of the instructions. The patient was advised to call back or seek an in-person evaluation if the symptoms worsen or if the condition fails to improve as anticipated.    I spent 11 minutes over the phone with the patient reviewing test results, discuss management and coordination/planning of care.  Meryl Crutch, MD 06/18/2023 3:39 PM [facility] - A DEPT OF [facility] [address] [place] [address] Dept: [phone] Dept Fax: [phone]   INTERVAL HISTORY:  Please see above for problem oriented charting.  The purpose of today's discussion is to explain recent lab results and to formulate plan of care.  He has a history of elevated white blood cell counts, previously ranging from thirteen thousand to  nineteen thousand. Recent laboratory results show a normal white blood cell count of nine thousand four hundred, with normal red  blood cell count, platelets, and iron studies. Tests for inflammation and thyroid function returned normal results. Additionally, tests for leukemia and other bone marrow disorders were negative.  He has been experiencing night sweats for 68 years. Despite normal thyroid and iron studies, the cause of his night sweats remains unexplained. A CT scan of the lungs in November and a scan of the abdomen, pelvis, and chest in 68 showed no concerning findings such as lymphadenopathy or signs suggestive of lymphoma.  He quit smoking, which is believed to have contributed positively to his current health status.  SUMMARY OF HEMATOLOGY HISTORY:  On 05/15/2023, labs showed white count of 19,600.  No differential was obtained.  Hemoglobin and platelet count were within normal limits.  He was referred to [place] for further evaluation of leukocytosis.   On review of records, he has had chronic, intermittent leukocytosis with white count ranging anywhere between 13,000 to 19,000 since 2018 at least.   He denies recent infection. The last prescription antibiotics was more than 3 months ago.    Patient denies sinus congestion, cough, urinary frequency/urgency or dysuria, diarrhea, joint swelling/pain or abnormal skin rash.    The patient reports experiencing severe night sweats for the past 68 years, which are so intense that he often requires changing clothes and sheets. The patient denies any fever and reports a good appetite. The patient quit smoking four weeks ago upon medical advice. The patient denies any history of steroid use.   He had no prior history or diagnosis of cancer. His age appropriate screening programs are up-to-date.   The patient has no prior diagnosis of autoimmune disease and was not prescribed corticosteroids related products.   Smoking cessation may reduce inflammation and leukocytosis.  After his consultation with [place], labs on 06/08/2023 showed normal white count of 9400 with  normal differential.  Hemoglobin normal at 15.4, platelet count normal at 370,000.  CMP was unremarkable except for glucose 166.  LDH, sed rate, CRP, TSH, iron studies were all within normal limits.  BCR/ABL 1 was negative.  JAK2 mutation analysis with reflex testing to include CALR, MPL, exon 12-15 mutations were all negative.  Flow cytometry of peripheral blood was also unremarkable.   Clinical picture is not indicative of myeloproliferative neoplasm.  Since patient quit smoking, we expect white count to remain stable.  Schedule an in-person follow-up appointment in three months.  REVIEW OF SYSTEMS:    Review of Systems - Oncology  All other pertinent systems were reviewed with the patient and are negative.  I have reviewed the past medical history, past surgical history, social history and family history with the patient and they are unchanged from previous note.  ALLERGIES:  He has no known allergies.  MEDICATIONS:  Current Outpatient Medications  Medication Sig Dispense Refill   aspirin EC 81 MG tablet Take 1 tablet (81 mg total) by mouth daily. Swallow whole. (Patient taking differently: Take 81 mg by mouth at bedtime. Swallow whole.)     cholecalciferol (VITAMIN D3) 25 MCG (1000 UNIT) tablet Take 1,000 Units by mouth at bedtime.     diazepam (VALIUM) 10 MG tablet Take 10 mg by mouth at bedtime.     DULoxetine (CYMBALTA) 60 MG capsule Take 60 mg by mouth every evening.     ezetimibe (ZETIA) 10 MG tablet Take 1 tablet (10 mg total) by mouth daily. (Patient  taking differently: Take 10 mg by mouth at bedtime.) 90 tablet 3   Ferrous Sulfate (IRON PO) Take 1 tablet by mouth every other day.     FIBER GUMMIES PO Take 2 capsules by mouth at bedtime.     methocarbamol (ROBAXIN) 750 MG tablet Take 750 mg by mouth 2 (two) times daily as needed for muscle spasms.     metoprolol succinate (TOPROL XL) 25 MG 24 hr tablet Take 0.5 tablets (12.5 mg total) by mouth daily. 30 tablet 5   Multiple  Vitamin tablet Take 1 tablet by mouth at bedtime.     pantoprazole (PROTONIX) 40 MG tablet Take 40 mg by mouth at bedtime.     rosuvastatin (CRESTOR) 20 MG tablet Take 1 tablet (20 mg total) by mouth at bedtime. 30 tablet 5   sertraline (ZOLOFT) 100 MG tablet Take 200 mg by mouth at bedtime.  0   No current facility-administered medications for this visit.    PHYSICAL EXAMINATION:    Onc Performance Status - 06/18/23 1500       ECOG Perf Status   ECOG Perf Status Restricted in physically strenuous activity but ambulatory and able to carry out work of a light or sedentary nature, e.g., light house work, office work      KPS SCALE   KPS % SCORE Able to carry on normal activity, minor s/s of disease             LABORATORY DATA:   I have reviewed the data as listed.  Recent Results (from the past 2160 hours)  MYOCARDIAL PERFUSION IMAGING     Status: None   Collection Time: 05/14/23 12:50 PM  Result Value Ref Range   Rest Nuclear Isotope Dose 10.3 mCi   Stress Nuclear Isotope Dose 30.9 mCi   Rest HR 62.0 bpm   Rest BP 120/83 mmHg   Peak HR 74 bpm   Peak BP 174/99 mmHg   SSS 2.0    SRS 2.0    SDS 0.0    TID 1.36    LV sys vol 110.0 mL   LV dias vol 195.0 62 - 150 mL   Nuc Stress EF 44 %   ST Depression (mm) 0 mm  Basic metabolic panel     Status: Abnormal   Collection Time: 05/15/23  3:21 PM  Result Value Ref Range   Glucose 104 (H) 70 - 99 mg/dL   BUN 18 8 - 27 mg/dL   Creatinine, Ser 1.61 0.76 - 1.27 mg/dL   eGFR 94 >09 UE/AVW/0.98   BUN/Creatinine Ratio 20 10 - 24   Sodium 144 134 - 144 mmol/L   Potassium 4.7 3.5 - 5.2 mmol/L   Chloride 105 96 - 106 mmol/L   CO2 26 20 - 29 mmol/L   Calcium 9.8 8.6 - 10.2 mg/dL  CBC     Status: Abnormal   Collection Time: 05/15/23  3:21 PM  Result Value Ref Range   WBC 19.6 (H) 3.4 - 10.8 x10E3/uL   RBC 5.19 4.14 - 5.80 x10E6/uL   Hemoglobin 16.0 13.0 - 17.7 g/dL   Hematocrit 11.9 14.7 - 51.0 %   MCV 95 79 - 97 fL    MCH 30.8 26.6 - 33.0 pg   MCHC 32.3 31.5 - 35.7 g/dL   RDW 82.9 56.2 - 13.0 %   Platelets 423 150 - 450 x10E3/uL  CBC     Status: Abnormal   Collection Time: 05/18/23  9:08 AM  Result Value  Ref Range   WBC 11.2 (H) 4.0 - 10.5 K/uL   RBC 5.10 4.22 - 5.81 MIL/uL   Hemoglobin 16.2 13.0 - 17.0 g/dL   HCT 02.7 25.3 - 66.4 %   MCV 95.3 80.0 - 100.0 fL   MCH 31.8 26.0 - 34.0 pg   MCHC 33.3 30.0 - 36.0 g/dL   RDW 40.3 47.4 - 25.9 %   Platelets 378 150 - 400 K/uL   nRBC 0.0 0.0 - 0.2 %    Comment: Performed at [facility], [address] [place], [place] [address]  ALT     Status: None   Collection Time: 06/04/23  8:20 AM  Result Value Ref Range   ALT 17 0 - 44 IU/L  NMR, lipoprofile     Status: Abnormal   Collection Time: 06/04/23  8:24 AM  Result Value Ref Range   LDL Particle Number 1,[id number] (H) <1,000 nmol/L    Comment:                           Low                   < 1000                           Moderate         1000 - 1299                           Borderline-High  1300 - 1599                           High             1600 - 2000                           Very High             > 2000    LDL-C (NIH Calc) 102 (H) 0 - 99 mg/dL    Comment:                           Optimal               <  100                           Above optimal     100 -  129                           Borderline        130 -  159                           High              160 -  189                           Very high             >  189    HDL-C 36 (L) >39 mg/dL   Triglycerides 564 0 - 149  mg/dL   Cholesterol, Total 657 100 - 199 mg/dL   HDL Particle Number 84.6 (L) >=30.5 umol/L   Small LDL Particle Number 626 (H) <=527 nmol/L   LDL Size 21.3 >20.5 nm    Comment:  ----------------------------------------------------------                  ** INTERPRETATIVE INFORMATION**                  PARTICLE CONCENTRATION AND SIZE                     <--Lower CVD Risk   Higher CVD Risk-->   LDL AND HDL  PARTICLES   Percentile in Reference Population   HDL-P (total)        High     75th    50th    25th   Low                        >34.9    34.9    30.5    26.7   <26.7   Small LDL-P          Low      25th    50th    75th   High                        <117     117     527     839    >839   LDL Size   <-Large (Pattern A)->    <-Small (Pattern B)->                     23.0    20.6           20.5      19.0  ---------------------------------------------------------- Small LDL-P and LDL Size are associated with CVD risk, but not after LDL-P is taken into account.    LP-IR Score 47 (H) <=45    Comment: INSULIN RESISTANCE MARKER     <--Insulin Sensitive    Insulin Resistant-->            Percentile in Reference Population Insulin Resistance Score LP-IR Score   Low   25th   50th   75th   High               <27   27     45     63     >63 LP-IR Score is inaccurate if patient is non-fasting. The LP-IR score is a laboratory developed index that has been associated with insulin resistance and diabetes risk and should be used as one component of a physician's clinical assessment.   Surgical pathology     Status: None   Collection Time: 06/08/23 12:00 AM  Result Value Ref Range   SURGICAL PATHOLOGY      Surgical Pathology CASE: [id number] PATIENT: [name] Flow Pathology Report     Clinical history: Leukocytosis, unspecified type     DIAGNOSIS:  -No monoclonal B-cell population or abnormal T-cell phenotype identified -Relative abundance of natural killer cells (25% of lymphocytes)   GATING AND PHENOTYPIC ANALYSIS:  Gated population: Flow cytometric immunophenotyping is performed using antibodies to the antigens listed in the table below. Electronic gates are placed around a cell cluster displaying light scatter properties corresponding to: lymphocytes  Abnormal Cells in gated population: N/A  Phenotype of Abnormal Cells: N/A  Lymphoid Antigens        Myeloid Antigens Miscellaneous CD2  tested    CD10 tested    CD11b     ND   CD45 tested CD3  tested    CD19 tested    CD11c     ND   HLA-Dr    ND CD4  tested    CD20 tested    CD13 ND   CD34 tested CD5  tested    CD22 ND   CD14 ND   CD38 tested CD7  tested    CD79b     ND    CD15 ND   CD138     ND CD8  tested    CD103     ND   CD16 ND   TdT  ND CD25 ND   CD200     tested    CD33 ND   CD123     ND TCRab     ND   sKappa    tested    CD64 ND   CD41 ND TCRgd     tested    sLambda   tested    CD117     ND   CD61 ND CD56 tested    cKappa    ND   MPO  ND   CD71 ND CD57 ND   cLambda   ND        [id number]      GROSS DESCRIPTION:  One lavender top tube submitted from [facility] for lymphoma testing.    Final Diagnosis performed by Guerry Bruin, MD.   Electronically signed 06/08/2023 Technical and / or Professional components performed at [facility], [address], [place], [place] [address].  The above tests were developed and their performance characteristics determined by the [facility] system for the physical and immunophenotypic characterization of cell populations. They have not been cleared by the U.S. Food and Drug administration. The  FDA has determined that such clearance or approval is not necessary. This test is [place] ed for clinical purposes. It should not be  regarded as investigational or for research   Ferritin     Status: None   Collection Time: 06/08/23  8:21 AM  Result Value Ref Range   Ferritin 58 24 - 336 ng/mL    Comment: Performed at [facility], [address] [place], [place] [address]  C-reactive protein     Status: None   Collection Time: 06/08/23  8:21 AM  Result Value Ref Range   CRP 0.6 <1.0 mg/dL    Comment: Performed at [facility], [address], [place], [place] [address]  TSH     Status: None   Collection Time: 06/08/23  8:24 AM  Result Value Ref Range   TSH 3.444 0.350 - 4.500 uIU/mL    Comment:  Performed by a 3rd Generation assay with a functional sensitivity of <=0.01 uIU/mL. Performed at [facility], [address] [place], [place] [address]   Iron and Iron Binding Capacity ([facility] only)     Status: None   Collection Time: 06/08/23  8:25 AM  Result Value Ref Range   Iron 108 45 - 182 ug/dL   TIBC 657 846 - 962 ug/dL   Saturation Ratios 28 17.9 - 39.5 %   UIBC 274 117 - 376 ug/dL    Comment: Performed at [facility], [address], [place], [place] [address]  BCR-ABL1 FISH     Status:  None   Collection Time: 06/08/23  8:25 AM  Result Value Ref Range   Specimen Type BLOOD    Cells Counted 200    Cells Analyzed 200    FISH Result ABL1 GENE FUSION     Comment: Comment: NO BCR    Interpretation Comment:     Comment: (NOTE) NEGATIVE             nuc ish 9q34(ASS1,ABL1)x2,22q11.2(BCRx2)[200].      The fluorescence in situ hybridization (FISH) study was normal. FISH, using unique sequence DNA probes for the ABL1 and BCR gene regions showed two ABL1 signals (red), two control ASS1 gene signals (aqua) located adjacent to the ABL1 locus at 9q34, and two BCR signals (green) at 22q11.2 in all interphase nuclei examined. There was NO evidence of CML or ALL-associated BCR::ABL1 dual fusion signals in this analysis. .      This analysis is limited to abnormalities detectable by the specific probes included in the study. FISH results should be interpreted within the context of a full cytogenetic analysis and pathology evaluation.  A BCR::ABL1 gene fusion in greater than 3 interphase nuclei in a patient with a new clinical diagnosis is considered positive. The DNA probe vendor for this study was [employer], community). .      This test was developed and its performance characteristics d etermined by [employer] ([facility]). It has not been cleared or approved by the U.S. Food and Drug  Administration.    Director Review: Comment:     Comment: (NOTE) Reino Bellis, PhD, [facility] Performed At: [facility] [address] [place], [place] [id number] Maurine Simmering MDPhD WU:[id number]   Lactate dehydrogenase     Status: None   Collection Time: 06/08/23  8:25 AM  Result Value Ref Range   LDH 124 98 - 192 U/L    Comment: Performed at [facility], [address] [place], [place] [address]  Sedimentation rate     Status: None   Collection Time: 06/08/23  8:25 AM  Result Value Ref Range   Sed Rate 16 0 - 16 mm/hr    Comment: Performed at [facility], [address] [place], [place] [address]  CMP (Cancer Center only)     Status: Abnormal   Collection Time: 06/08/23  8:25 AM  Result Value Ref Range   Sodium 140 135 - 145 mmol/L   Potassium 4.1 3.5 - 5.1 mmol/L   Chloride 106 98 - 111 mmol/L   CO2 27 22 - 32 mmol/L   Glucose, Bld 166 (H) 70 - 99 mg/dL    Comment: Glucose reference range applies only to samples taken after fasting for at least 8 hours.   BUN 15 8 - 23 mg/dL   Creatinine 7.84 6.96 - 1.24 mg/dL   Calcium 9.5 8.9 - 29.5 mg/dL   Total Protein 7.6 6.5 - 8.1 g/dL   Albumin 4.2 3.5 - 5.0 g/dL   AST 18 15 - 41 U/L   ALT 17 0 - 44 U/L   Alkaline Phosphatase 83 38 - 126 U/L   Total Bilirubin 0.4 0.0 - 1.2 mg/dL   GFR, Estimated >28 >41 mL/min    Comment: (NOTE) Calculated using the CKD-EPI Creatinine Equation (2021)    Anion gap 7 5 - 15    Comment: Performed at [facility], [address] [place], [place] [address]  CBC with Differential (Cancer Center Only)  Status: None   Collection Time: 06/08/23  8:25 AM  Result Value Ref Range   WBC Count 9.4 4.0 - 10.5 K/uL   RBC 4.90 4.22 - 5.81 MIL/uL   Hemoglobin 15.4 13.0 - 17.0 g/dL   HCT 14.7 82.9 - 56.2 %   MCV 93.3 80.0 - 100.0 fL   MCH 31.4 26.0 - 34.0 pg   MCHC 33.7 30.0 - 36.0 g/dL   RDW 13.0 86.5 - 78.4 %   Platelet  Count 370 150 - 400 K/uL   nRBC 0.0 0.0 - 0.2 %   Neutrophils Relative % 58 %   Neutro Abs 5.5 1.7 - 7.7 K/uL   Lymphocytes Relative 27 %   Lymphs Abs 2.5 0.7 - 4.0 K/uL   Monocytes Relative 10 %   Monocytes Absolute 0.9 0.1 - 1.0 K/uL   Eosinophils Relative 4 %   Eosinophils Absolute 0.4 0.0 - 0.5 K/uL   Basophils Relative 1 %   Basophils Absolute 0.1 0.0 - 0.1 K/uL   Immature Granulocytes 0 %   Abs Immature Granulocytes 0.02 0.00 - 0.07 K/uL    Comment: Performed at [facility], [address], [place], [place] [address]  Flow Cytometry, Peripheral Blood (Oncology)     Status: None   Collection Time: 06/08/23  8:26 AM  Result Value Ref Range   Flow Cytometry SEE SEPARATE REPORT     Comment: Performed at [facility], [address], [place], [place] [address]  JAK2 V617F rfx CALR/MPL/E12-15     Status: None   Collection Time: 06/08/23  8:26 AM  Result Value Ref Range   Specimen Type Comment:     Comment: NOT PROVIDED CORRECTED ON 04/05 AT 1035: PREVIOUSLY REPORTED AS PERIPHERAL BLOOD SAMPLE    JAK2 V617F Result Comment     Comment: (NOTE) NEGATIVE The JAK2 V617F mutation is not detected in the provided specimen of this individual. Results should be interpreted in conjunction with clinical and other laboratory findings for the most accurate interpretation. This test was developed and its performance characteristics determined by [facility]. It has not been cleared or approved by the Food and Drug Administration.    Reflex Comment     Comment: (NOTE) Reflex to CALR Mutation Analysis, JAK2 Exon 12-15 Mutation Analysis, and MPL Mutation Analysis is indicated.    V617F Rfx CALR/MPL/E12-15 Bkgd Comment     Comment: (NOTE)    Molecular testing of blood or bone marrow is useful in the evaluation of suspected myeloproliferative neoplasms (MPN). Mutations in the JAK2, MPL, and CALR genes are present in virtually all MPNs and  their presence help distinguish benign reactive processes from clonal neoplasms. These mutations are generally considered mutually exclusive, although concurrent clones have been reported in rare patients. This test will assess for the JAK2V617F (exon 14) mutation first and will reflex to CALR mutation analysis, MPL mutation analysis, and JAK2 exon 12 to 15 mutation analysis if the JAK2V617F mutation is negative.    The JAK2 (Janus kinase 2) gene encodes for a non-receptor protein tyrosine kinase that activates cytokine and growth factor signaling. The V617F (c.1849 G>T) mutation results in constitutive activation of JAK2 and downstream STAT5 and ERK signaling. The V617F mutation is observed in approximately 95% of polycythemia vera (PV), 60% of essential thromboc ythemia (ET) and primary myelofibrosis (PMF). It is also infrequently present (3-5%) in myelodysplastic syndrome, chronic myelomonocytic leukemia, and other atypical chronic myeloid disorders. A small percentage of JAK2 mutation positive  patients (3.3%) contain other non-V617F mutations within exons 12 to 15. In particular, mutations in exon 12 of JAK2 have been described in approximately 3% of patients with PV. JAK2 allele burden correlates with clinical phenotype, with low levels of mutant allele characterized by thrombocytosis, intermediate levels with erythrocytosis, and high mutant allele burden correlating with enhanced myelopoiesis of the BM, leukocytosis, increasing spleen size, and circulating CD34-positive cells.    The CALR (Calreticulin) gene encodes for a multifunctional calcium-binding protein involved in many cellular activities such as growth, proliferation, adhesion, and programmed cell death. Among patients with JAK2 negative MPNs, CALR are found in a pproximately 70% of patients with JAK2-negative essential thrombocythemia (ET) and 60-88% of patients with JAK2-negative primary myelofibrosis(PMF). Only a  minority of patients (approximately 8%) with myelodysplasia have mutations in the CALR gene. CALR mutations are rarely detected in patients with de novo acute myeloid leukemia, chronic myelogenous leukemia, lymphoid leukemia, or solid tumors. CALR mutations are not detected in polycythemia and generally appear to be mutually exclusive with JAK2 mutations and MPL mutations. The majority of mutational changes involve a variety of insertion deletion mutations in exon 9 of the calreticulin gene: approximately 53% of all CALR mutations are a 52 bp deletion (type-1) while the second most prevalent mutation (approximately 32%) contains a 5 bp insertion (type-2). Other mutations (non-type 1 or type 2) are seen in a small minority of cases. CALR mutations in PMF tend to be with a favorable prognosis compared to JAK2 V617F [employer], whereas primary myelofibrosis negative for CALR, JAK2 V617F and MPL mutations (so-called triple negative) is associated with a poor prognosis and shorter survival.    The MPL (myeloproliferative leukemia virus oncogene) gene encodes the thrombopoietin receptor which regulates hematopoiesis and megakaryopoiesis. Activating MPL mutations are associated with a subset of myeloproliferative neoplasms and acute megakaryoblastic leukemia. MPL W515 mutations are present in approximately 5-8% of patients with primary myelofibrosis (PMF) and 1-4% of patients with essential thrombocythemia (ET). The S505 mutation is detected in patients with hereditary thrombocythemia.    Limitations    This assay has a sensitivity of approximately 1% VAF for JAK2 V617F, 2.5% VAF for other mutations in JAK2 exons 12 to 15, CALR mutations, and MPL mutations. Deletions in JAK2 up to 6 bp and insertions up to 34 bp have been detected in validation studies. Deletions in CALR up to 70 bp and i nsertions up to 12 bp have been detected in validation studies.    Method based next generation  sequencing.     Comment: Comment [employer]    References Comment     Comment: (NOTE) Alghasham N, Alnouri Y, Abalkhail H, Clarita Leber. Detection of mutations in JAK2 exons 12-15 by [employer] sequencing. Int J Lab Hematol. 2016 Feb;38(1):34-41. doi: 10.1111/ijlh.82956. Epub 2015 Sep 11. PMID: 21308657. Pura Spice, Unk Lightning, Hasserjian R, Patrica Duel, Borowitz MJ, Leroy Libman MM, [place] CD, [place], Vardiman JW. The 2016 revision to the World Health Organization classification of myeloid neoplasms and acute leukemia. Blood. 2016 May 19;127(20):2391-405. doi: 10.1182/blood-2016-03-643544. Epub 2016 Apr 11. PMID: 84696295. Genevie Ann [facility], Zhang ZJ, [employer] S, Albitar M. Mutation profile of JAK2 transcripts in patients with chronic myeloproliferative neoplasias. J Mol Diagn. 2009 Jan;11(1):49-53.doi: 10.2353/jmoldx.2009.080114. Epub 2008 Dec 12. PMID: 28413244; PMCID: WNU2725366. NCCN Clinical Practice Guidelines in Oncology (NCCN Guidelines) Myeloproliferative Neoplasms Version 3.2022 - October 18, 2020. Swerdlow SH, Programmer, multimedia. WHO classif ication of Tumours of Haematopoietic and Lymphoid Tissues. 4th edn. Jaci Standard, [place]: Geologist, engineering  for Research on Cancer; 2017. Tefferi A. Primary myelofibrosis: 2021 update on diagnosis, risk-stratification and management. Am J Hematol. 2021 Jan;96(1):145-162. doi: 10.1002/ajh.26050. Epub 2020 Dec 2. PMID: 09811914. Royetta Car, Kralovics R. Genetic basis and molecular pathophysiology of classical myeloproliferative neoplasms. Blood. 2017 Feb 9;129(6):667-679. doi: 10.1182/blood-2016-10-695940. Epub 2016 Dec 27. PMID: 78295621.    Director Review Comment     Comment: (NOTE) Mellody Life, PhD, [facility]    Director, Molecular Oncology    [facility]    Research [place], [place] [address]    [phone] Performed At: [facility] [address] [place], [place] [id number] Maurine Simmering MDPhD  [id number] Performed At: [facility] [address] [place], [place] [id number] Maurine Simmering MDPhD VF:[phone]   CALR +MPL + E12-E15 (reflexed)     Status: None   Collection Time: 06/08/23  8:26 AM  Result Value Ref Range   CALR Result Comment     Comment: (NOTE) NEGATIVE No insertions or deletions were detected within the analyzed region of the calreticulin (CALR) gene. A negative result does not entirely exclude the possibility of a clonal population carrying CALR gene mutations that are not covered by this assay. Results should be interpreted in conjunction with clinical and laboratory findings for the most accurate interpretation.    MPL Result Comment     Comment: (NOTE) NEGATIVE No MPL mutation was identified in the provided specimen of this individual. Results should be interpreted in conjunction with clinical and other laboratory findings for the most accurate interpretation.    E12-15 Result Comment     Comment: (NOTE) NEGATIVE    JAK2 mutations were not detected in exons 12, 13, 14 and 15. The G to T nucleotide change encoding the V617F mutation was not detected. This result does not rule out the presence of JAK2 mutation at a level below the detection sensitivity of this assay, the presence of other mutations outside the analyzed region of the JAK2 gene, or the presence of a myeloproliferative or other neoplasm. Result must be correlated with other clinical data for the most accurate diagnosis. Performed At: [facility] [address] [place], [place] [id number] Maurine Simmering MDPhD [id number]      RADIOGRAPHIC STUDIES:  No recent pertinent imaging studies available to review.  Orders Placed This Encounter  Procedures   CBC with Differential (Cancer Center Only)    Standing Status:   Future    Expected Date:   09/03/2023    Expiration Date:   06/17/2024   Testosterone    Standing Status:   Future    Expected Date:   09/03/2023     Expiration Date:   06/17/2024   PSA, total and free    Standing Status:   Future    Expected Date:   09/03/2023    Expiration Date:   06/17/2024     Future Appointments  Date Time Provider Department Center  09/03/2023 10:15 AM [facility] None  09/03/2023 10:45 AM Thedford Bunton, Archie Patten, MD [facility] None    This document was completed utilizing speech recognition software. Grammatical errors, random word insertions, pronoun errors, and incomplete sentences are an occasional consequence of this system due to software limitations, ambient noise, and hardware issues. Any formal questions or concerns about the content, text or information contained within the body of this dictation should be directly addressed to the provider for clarification.

## 2023-06-18 NOTE — Assessment & Plan Note (Signed)
 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 Current count is 11,200, slightly above normal.   Smoking cessation may reduce inflammation and leukocytosis.  After his consultation with [place], labs on 06/08/2023 showed normal white count of 9400 with normal differential.  Hemoglobin normal at 15.4, platelet count normal at 370,000.  CMP was unremarkable except for glucose 166.  LDH, sed rate, CRP, TSH, iron studies were all within normal limits.  BCR/ABL 1 was negative.  JAK2 mutation analysis with reflex testing to include CALR, MPL, exon 12-15 mutations were all negative.  Flow cytometry of peripheral blood was also unremarkable.   Clinical picture is not indicative of myeloproliferative neoplasm.  Since patient quit smoking, we expect white count to remain stable.  Schedule an in-person follow-up appointment in three months.

## 2023-06-18 NOTE — Assessment & Plan Note (Signed)
 Severe night sweats requiring change of clothes and sheets, ongoing for approximately seven years. Potential causes include systemic inflammation due to smoking, thyroid dysfunction, or other systemic conditions. Further evaluation is needed to determine the underlying cause. The night sweats may improve over time, especially with smoking cessation.  TSH and iron studies were normal on 06/08/2023.  Previously he had CT chest, abdomen and pelvis in 2023 and 2024 which showed no findings to suggest lymphoma or other malignancy.  If symptoms were to persist on return visit, we will check testosterone, PSA and also consider pursuing repeat imaging studies.

## 2023-07-01 DIAGNOSIS — R7309 Other abnormal glucose: Secondary | ICD-10-CM | POA: Diagnosis not present

## 2023-07-01 DIAGNOSIS — E785 Hyperlipidemia, unspecified: Secondary | ICD-10-CM | POA: Diagnosis not present

## 2023-07-01 DIAGNOSIS — R972 Elevated prostate specific antigen [PSA]: Secondary | ICD-10-CM | POA: Diagnosis not present

## 2023-07-08 DIAGNOSIS — R7309 Other abnormal glucose: Secondary | ICD-10-CM | POA: Diagnosis not present

## 2023-07-08 DIAGNOSIS — F329 Major depressive disorder, single episode, unspecified: Secondary | ICD-10-CM | POA: Diagnosis not present

## 2023-07-08 DIAGNOSIS — M5136 Other intervertebral disc degeneration, lumbar region with discogenic back pain only: Secondary | ICD-10-CM | POA: Diagnosis not present

## 2023-07-08 DIAGNOSIS — I2510 Atherosclerotic heart disease of native coronary artery without angina pectoris: Secondary | ICD-10-CM | POA: Diagnosis not present

## 2023-07-08 DIAGNOSIS — E785 Hyperlipidemia, unspecified: Secondary | ICD-10-CM | POA: Diagnosis not present

## 2023-07-08 DIAGNOSIS — R972 Elevated prostate specific antigen [PSA]: Secondary | ICD-10-CM | POA: Diagnosis not present

## 2023-07-08 DIAGNOSIS — Z79899 Other long term (current) drug therapy: Secondary | ICD-10-CM | POA: Diagnosis not present

## 2023-08-24 ENCOUNTER — Other Ambulatory Visit: Payer: Self-pay | Admitting: Urology

## 2023-08-24 DIAGNOSIS — R972 Elevated prostate specific antigen [PSA]: Secondary | ICD-10-CM

## 2023-08-25 ENCOUNTER — Encounter: Payer: Self-pay | Admitting: Urology

## 2023-09-03 ENCOUNTER — Inpatient Hospital Stay: Admitting: Oncology

## 2023-09-03 ENCOUNTER — Inpatient Hospital Stay: Attending: Oncology

## 2023-09-03 ENCOUNTER — Encounter: Payer: Self-pay | Admitting: Oncology

## 2023-09-03 VITALS — BP 111/77 | HR 63 | Temp 98.0°F | Resp 17 | Wt 172.3 lb

## 2023-09-03 DIAGNOSIS — C61 Malignant neoplasm of prostate: Secondary | ICD-10-CM | POA: Insufficient documentation

## 2023-09-03 DIAGNOSIS — D72829 Elevated white blood cell count, unspecified: Secondary | ICD-10-CM | POA: Diagnosis not present

## 2023-09-03 DIAGNOSIS — R61 Generalized hyperhidrosis: Secondary | ICD-10-CM | POA: Insufficient documentation

## 2023-09-03 DIAGNOSIS — R972 Elevated prostate specific antigen [PSA]: Secondary | ICD-10-CM

## 2023-09-03 LAB — CBC WITH DIFFERENTIAL (CANCER CENTER ONLY)
Abs Immature Granulocytes: 0.02 10*3/uL (ref 0.00–0.07)
Basophils Absolute: 0.1 10*3/uL (ref 0.0–0.1)
Basophils Relative: 1 %
Eosinophils Absolute: 0.3 10*3/uL (ref 0.0–0.5)
Eosinophils Relative: 4 %
HCT: 40.6 % (ref 39.0–52.0)
Hemoglobin: 13.4 g/dL (ref 13.0–17.0)
Immature Granulocytes: 0 %
Lymphocytes Relative: 28 %
Lymphs Abs: 2.3 10*3/uL (ref 0.7–4.0)
MCH: 31.3 pg (ref 26.0–34.0)
MCHC: 33 g/dL (ref 30.0–36.0)
MCV: 94.9 fL (ref 80.0–100.0)
Monocytes Absolute: 0.7 10*3/uL (ref 0.1–1.0)
Monocytes Relative: 8 %
Neutro Abs: 4.7 10*3/uL (ref 1.7–7.7)
Neutrophils Relative %: 59 %
Platelet Count: 210 10*3/uL (ref 150–400)
RBC: 4.28 MIL/uL (ref 4.22–5.81)
RDW: 13.3 % (ref 11.5–15.5)
WBC Count: 8.1 10*3/uL (ref 4.0–10.5)
nRBC: 0 % (ref 0.0–0.2)

## 2023-09-03 NOTE — Assessment & Plan Note (Addendum)
 PSA level increased from 8 to 9.4.  Concern for prostate cancer.  Absence of urinary symptoms suggests issue may be away from the urethra. Urology is managing the case. - Proceed with MRI on Tuesday - Follow up with urology for biopsy and further management - Discuss potential treatment options with urology if prostate cancer is confirmed

## 2023-09-03 NOTE — Assessment & Plan Note (Addendum)
 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 Current count is 11,200, slightly above normal.   Smoking cessation may reduce inflammation and leukocytosis.  After his consultation with us , labs on 06/08/2023 showed normal white count of 9400 with normal differential.  Hemoglobin normal at 15.4, platelet count normal at 370,000.  CMP was unremarkable except for glucose 166.  LDH, sed rate, CRP, TSH, iron studies were all within normal limits.  BCR/ABL 1 was negative.  JAK2 mutation analysis with reflex testing to include CALR, MPL, exon 12-15 mutations were all negative.  Flow cytometry of peripheral blood was also unremarkable.  Repeat labs today also showed normal white count of 8100 with normal differential.  Hemoglobin and platelet count are within normal limits.   Clinical picture is not indicative of myeloproliferative neoplasm.  Since patient quit smoking, we expect white count to remain stable.  Originally plan was to see him in 1 year for follow-up.  However given recent PSA issues, we will see him sooner in clinic to follow-up on the workup.

## 2023-09-03 NOTE — Assessment & Plan Note (Signed)
 Severe night sweats requiring change of clothes and sheets, ongoing for approximately seven years. Potential causes include systemic inflammation due to smoking, thyroid dysfunction, or other systemic conditions. Further evaluation is needed to determine the underlying cause. The night sweats may improve over time, especially with smoking cessation.  TSH and iron studies were normal on 06/08/2023.  Previously he had CT chest, abdomen and pelvis in 2023 and 2024 which showed no findings to suggest lymphoma or other malignancy.  If symptoms were to persist on return visit, we will check testosterone, PSA and also consider pursuing repeat imaging studies.

## 2023-09-03 NOTE — Progress Notes (Signed)
 [facility]  HEMATOLOGY CLINIC PROGRESS NOTE  PATIENT NAME: [name]   MR#: [id number] DOB: 09/15/1955  Patient Care Team: Gerome Brunet, DO as PCP - General (Family Medicine) Lonni Slain, MD as PCP - Cardiology (Cardiology)  Date of visit: 09/03/2023   ASSESSMENT & PLAN:   Kendrew Paci is a 68 y.o.  pleasant gentleman with past medical history of hyperlipidemia, prediabetes, degenerative joint disease, previous tobacco smoking, was referred to our clinic in March 2025 for further evaluation of leukocytosis.    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 Current count is 11,200, slightly above normal.   Smoking cessation may reduce inflammation and leukocytosis.  After his consultation with us , labs on 06/08/2023 showed normal white count of 9400 with normal differential.  Hemoglobin normal at 15.4, platelet count normal at 370,000.  CMP was unremarkable except for glucose 166.  LDH, sed rate, CRP, TSH, iron studies were all within normal limits.  BCR/ABL 1 was negative.  JAK2 mutation analysis with reflex testing to include CALR, MPL, exon 12-15 mutations were all negative.  Flow cytometry of peripheral blood was also unremarkable.  Repeat labs today also showed normal white count of 8100 with normal differential.  Hemoglobin and platelet count are within normal limits.   Clinical picture is not indicative of myeloproliferative neoplasm.  Since patient quit smoking, we expect white count to remain stable.  Originally plan was to see him in 1 year for follow-up.  However given recent PSA issues, we will see him sooner in clinic to follow-up on the workup.  Night sweats Severe night sweats requiring change of clothes and sheets, ongoing for approximately seven years. Potential causes include systemic inflammation due to smoking, thyroid  dysfunction, or other systemic conditions.  Further evaluation is needed to determine the underlying cause. The night sweats may improve over time, especially with smoking cessation.  TSH and iron studies were normal on 06/08/2023.  Previously he had CT chest, abdomen and pelvis in 2023 and 2024 which showed no findings to suggest lymphoma or other malignancy.  If symptoms were to persist on return visit, we will check testosterone, PSA and also consider pursuing repeat imaging studies.  Elevated PSA PSA level increased from 8 to 9.4.  Concern for prostate cancer.  Absence of urinary symptoms suggests issue may be away from the urethra. Urology is managing the case. - Proceed with MRI on Tuesday - Follow up with urology for biopsy and further management - Discuss potential treatment options with urology if prostate cancer is confirmed      I spent a total of 20 minutes during this encounter with the patient including review of chart and various tests results, discussions about plan of care and coordination of care plan.  I reviewed lab results and outside records for this visit and discussed relevant results with the patient. Diagnosis, plan of care and treatment options were also discussed in detail with the patient. Opportunity provided to ask questions and answers provided to his apparent satisfaction. Provided instructions to call our clinic with any problems, questions or concerns prior to return visit. I recommended to continue follow-up with PCP and sub-specialists. He verbalized understanding and agreed with the plan. No barriers to learning was detected.  Chinita Patten, MD  09/03/2023 2:17 PM  [facility] - A DEPT OF [place] [address] [place]  [address] Dept: [phone] Dept Fax: [phone]   CHIEF COMPLAINT/ REASON FOR VISIT:  Follow-up for history of leukocytosis.  Negative hematological workup.  Likely reactionary to previous tobacco  use.  He quit smoking in March 2025.  INTERVAL HISTORY:  Discussed the use of AI scribe software for clinical note transcription with the patient, who gave verbal consent to proceed.  History of Present Illness [name] is a 68 year old male who presents with elevated PSA levels for further evaluation. He was referred by his primary doctor for elevated PSA levels.  His PSA levels were initially checked and found to be elevated, prompting a referral to a urologist. The urologist noted an increase in PSA from 8 to 9.4, leading to the scheduling of an MRI and a planned biopsy. The MRI is scheduled for Tuesday, September 07, 2023. No urinary symptoms such as hematuria, dysuria, or difficulty urinating.  He has a history of fluctuating white blood cell counts, previously as high as 19,000 since at least 2018, but his most recent count is normal at 8,100. Previous workup for leukemia was negative, and current labs show no indication of leukemia or bone marrow issues.  He regularly donates platelets, approximately every week, and his platelet count is typically around 1,300. His current hemoglobin is 13.4, and his platelet count is 210,000.  He has a history of smoking but quit in March 2025.   SUMMARY OF HEMATOLOGIC HISTORY:  On 05/15/2023, labs showed white count of 19,600.  No differential was obtained.  Hemoglobin and platelet count were within normal limits.  He was referred to us  for further evaluation of leukocytosis.   On review of records, he has had chronic, intermittent leukocytosis with white count ranging anywhere between 13,000 to 19,000 since 2018 at least.   He denies recent infection. The last prescription antibiotics was more than 3 months ago.    Patient denies sinus congestion, cough, urinary frequency/urgency or dysuria, diarrhea, joint swelling/pain or abnormal skin rash.    The patient reports experiencing severe night sweats for the past seven years, which are so intense  that he often requires changing clothes and sheets. The patient denies any fever and reports a good appetite. The patient quit smoking four weeks ago upon medical advice. The patient denies any history of steroid use.   He had no prior history or diagnosis of cancer. His age appropriate screening programs are up-to-date.   The patient has no prior diagnosis of autoimmune disease and was not prescribed corticosteroids related products.   Smoking cessation may reduce inflammation and leukocytosis.   After his consultation with us , labs on 06/08/2023 showed normal white count of 9400 with normal differential.  Hemoglobin normal at 15.4, platelet count normal at 370,000.  CMP was unremarkable except for glucose 166.  LDH, sed rate, CRP, TSH, iron studies were all within normal limits.  BCR/ABL 1 was negative.  JAK2 mutation analysis with reflex testing to include CALR, MPL, exon 12-15 mutations were all negative.  Flow cytometry of peripheral blood was also unremarkable.   Clinical picture is not indicative of myeloproliferative neoplasm.  Since patient quit smoking, we expect white count to remain stable.    I have reviewed the past medical history, past surgical history, social history and family history with the patient and they are unchanged from previous note.  ALLERGIES: He has no known allergies.  MEDICATIONS:  Current Outpatient Medications  Medication Sig Dispense Refill   aspirin  EC 81 MG tablet Take 1 tablet (  81 mg total) by mouth daily. Swallow whole. (Patient taking differently: Take 81 mg by mouth at bedtime. Swallow whole.)     cholecalciferol (VITAMIN D3) 25 MCG (1000 UNIT) tablet Take 1,000 Units by mouth at bedtime.     diazepam (VALIUM) 10 MG tablet Take 10 mg by mouth at bedtime.     DULoxetine (CYMBALTA) 60 MG capsule Take 60 mg by mouth every evening.     ezetimibe  (ZETIA ) 10 MG tablet Take 1 tablet (10 mg total) by mouth daily. (Patient taking differently: Take 10 mg by  mouth at bedtime.) 90 tablet 3   Ferrous Sulfate (IRON PO) Take 1 tablet by mouth every other day.     FIBER GUMMIES PO Take 2 capsules by mouth at bedtime.     methocarbamol  (ROBAXIN ) 750 MG tablet Take 750 mg by mouth 2 (two) times daily as needed for muscle spasms.     metoprolol  succinate (TOPROL  XL) 25 MG 24 hr tablet Take 0.5 tablets (12.5 mg total) by mouth daily. 30 tablet 5   Multiple Vitamin tablet Take 1 tablet by mouth at bedtime.     pantoprazole (PROTONIX) 40 MG tablet Take 40 mg by mouth at bedtime.     rosuvastatin  (CRESTOR ) 20 MG tablet Take 1 tablet (20 mg total) by mouth at bedtime. 30 tablet 5   sertraline (ZOLOFT) 100 MG tablet Take 200 mg by mouth at bedtime.  0   No current facility-administered medications for this visit.     REVIEW OF SYSTEMS:    Review of Systems - Oncology  All other pertinent systems were reviewed with the patient and are negative.  PHYSICAL EXAMINATION:    Onc Performance Status - 09/03/23 1059       ECOG Perf Status   ECOG Perf Status Restricted in physically strenuous activity but ambulatory and able to carry out work of a light or sedentary nature, e.g., light house work, office work      KPS SCALE   KPS % SCORE Normal activity with effort, some s/s of disease          Vitals:   09/03/23 1045  BP: 111/77  Pulse: 63  Resp: 17  Temp: 98 F (36.7 C)  SpO2: 96%   Filed Weights   09/03/23 1045  Weight: 172 lb 4.8 oz (78.2 kg)    Physical Exam Constitutional:      General: He is not in acute distress.    Appearance: Normal appearance.  HENT:     Head: Normocephalic and atraumatic.   Eyes:     Conjunctiva/sclera: Conjunctivae normal.    Cardiovascular:     Rate and Rhythm: Normal rate and regular rhythm.  Pulmonary:     Effort: Pulmonary effort is normal. No respiratory distress.  Abdominal:     General: There is no distension.   Neurological:     General: No focal deficit present.     Mental Status: He is  alert and oriented to person, place, and time.   Psychiatric:        Mood and Affect: Mood normal.        Behavior: Behavior normal.     LABORATORY DATA:   I have reviewed the data as listed.  Results for orders placed or performed in visit on 09/03/23  CBC with Differential (Cancer Center Only)  Result Value Ref Range   WBC Count 8.1 4.0 - 10.5 K/uL   RBC 4.28 4.22 - 5.81 MIL/uL   Hemoglobin 13.4 13.0 -  17.0 g/dL   HCT 59.3 60.9 - 47.9 %   MCV 94.9 80.0 - 100.0 fL   MCH 31.3 26.0 - 34.0 pg   MCHC 33.0 30.0 - 36.0 g/dL   RDW 86.6 88.4 - 84.4 %   Platelet Count 210 150 - 400 K/uL   nRBC 0.0 0.0 - 0.2 %   Neutrophils Relative % 59 %   Neutro Abs 4.7 1.7 - 7.7 K/uL   Lymphocytes Relative 28 %   Lymphs Abs 2.3 0.7 - 4.0 K/uL   Monocytes Relative 8 %   Monocytes Absolute 0.7 0.1 - 1.0 K/uL   Eosinophils Relative 4 %   Eosinophils Absolute 0.3 0.0 - 0.5 K/uL   Basophils Relative 1 %   Basophils Absolute 0.1 0.0 - 0.1 K/uL   Immature Granulocytes 0 %   Abs Immature Granulocytes 0.02 0.00 - 0.07 K/uL    RADIOGRAPHIC STUDIES:  No recent pertinent imaging studies available to review.  Orders Placed This Encounter  Procedures   CBC with Differential (Cancer Center Only)    Standing Status:   Future    Expected Date:   12/04/2023    Expiration Date:   03/03/2024   Lactate dehydrogenase    Standing Status:   Future    Expected Date:   12/04/2023    Expiration Date:   03/03/2024   CMP (Cancer Center only)    Standing Status:   Future    Expected Date:   12/04/2023    Expiration Date:   03/03/2024   Prostate-Specific AG, Serum    Standing Status:   Future    Expected Date:   12/04/2023    Expiration Date:   03/03/2024     Future Appointments  Date Time Provider Department Center  09/08/2023 12:40 PM [facility] [place] MRI 1 [facility]  12/03/2023  8:00 AM [facility] None  12/03/2023  8:30 AM Mckenzie Toruno, Chinita, MD [facility] None     This document  was completed utilizing speech recognition software. Grammatical errors, random word insertions, pronoun errors, and incomplete sentences are an occasional consequence of this system due to software limitations, ambient noise, and hardware issues. Any formal questions or concerns about the content, text or information contained within the body of this dictation should be directly addressed to the provider for clarification.

## 2023-09-04 LAB — PSA, TOTAL AND FREE
PSA, Free Pct: 9.2 %
PSA, Free: 0.87 ng/mL
Prostate Specific Ag, Serum: 9.5 ng/mL — ABNORMAL HIGH (ref 0.0–4.0)

## 2023-09-04 LAB — TESTOSTERONE: Testosterone: 404 ng/dL (ref 264–916)

## 2023-09-08 ENCOUNTER — Ambulatory Visit
Admission: RE | Admit: 2023-09-08 | Discharge: 2023-09-08 | Disposition: A | Discharge status: Home / Self Care | Source: Ambulatory Visit | Attending: Urology | Admitting: Urology

## 2023-09-08 DIAGNOSIS — N401 Enlarged prostate with lower urinary tract symptoms: Secondary | ICD-10-CM | POA: Diagnosis not present

## 2023-09-08 DIAGNOSIS — R972 Elevated prostate specific antigen [PSA]: Secondary | ICD-10-CM | POA: Diagnosis not present

## 2023-09-08 MED ORDER — GADOPICLENOL 0.5 MMOL/ML IV SOLN
8.0000 mL | Freq: Once | INTRAVENOUS | Status: AC | PRN
  Administered 2023-09-08: 8 mL via INTRAVENOUS

## 2023-09-15 ENCOUNTER — Encounter (HOSPITAL_BASED_OUTPATIENT_CLINIC_OR_DEPARTMENT_OTHER): Payer: Self-pay

## 2023-09-17 ENCOUNTER — Telehealth: Payer: Self-pay

## 2023-09-17 ENCOUNTER — Other Ambulatory Visit: Payer: Self-pay

## 2023-09-17 ENCOUNTER — Emergency Department (HOSPITAL_BASED_OUTPATIENT_CLINIC_OR_DEPARTMENT_OTHER): Admitting: Radiology

## 2023-09-17 DIAGNOSIS — D72829 Elevated white blood cell count, unspecified: Secondary | ICD-10-CM | POA: Insufficient documentation

## 2023-09-17 DIAGNOSIS — R112 Nausea with vomiting, unspecified: Secondary | ICD-10-CM | POA: Insufficient documentation

## 2023-09-17 DIAGNOSIS — R197 Diarrhea, unspecified: Secondary | ICD-10-CM | POA: Diagnosis not present

## 2023-09-17 DIAGNOSIS — I700 Atherosclerosis of aorta: Secondary | ICD-10-CM | POA: Diagnosis not present

## 2023-09-17 DIAGNOSIS — R0602 Shortness of breath: Secondary | ICD-10-CM | POA: Diagnosis present

## 2023-09-17 DIAGNOSIS — R1084 Generalized abdominal pain: Secondary | ICD-10-CM | POA: Diagnosis not present

## 2023-09-17 DIAGNOSIS — R071 Chest pain on breathing: Secondary | ICD-10-CM | POA: Diagnosis not present

## 2023-09-17 DIAGNOSIS — Z7982 Long term (current) use of aspirin: Secondary | ICD-10-CM | POA: Diagnosis not present

## 2023-09-17 DIAGNOSIS — I10 Essential (primary) hypertension: Secondary | ICD-10-CM | POA: Diagnosis not present

## 2023-09-17 LAB — URINALYSIS, ROUTINE W REFLEX MICROSCOPIC
Bilirubin Urine: NEGATIVE
Glucose, UA: NEGATIVE mg/dL
Hgb urine dipstick: NEGATIVE
Nitrite: NEGATIVE
Protein, ur: NEGATIVE mg/dL
Specific Gravity, Urine: 1.016 (ref 1.005–1.030)
pH: 8 (ref 5.0–8.0)

## 2023-09-17 LAB — CBC
HCT: 40.8 % (ref 39.0–52.0)
Hemoglobin: 14.1 g/dL (ref 13.0–17.0)
MCH: 31.9 pg (ref 26.0–34.0)
MCHC: 34.6 g/dL (ref 30.0–36.0)
MCV: 92.3 fL (ref 80.0–100.0)
Platelets: 411 K/uL — ABNORMAL HIGH (ref 150–400)
RBC: 4.42 MIL/uL (ref 4.22–5.81)
RDW: 12.7 % (ref 11.5–15.5)
WBC: 17.9 K/uL — ABNORMAL HIGH (ref 4.0–10.5)
nRBC: 0 % (ref 0.0–0.2)

## 2023-09-17 MED ORDER — ONDANSETRON HCL 4 MG/2ML IJ SOLN
4.0000 mg | Freq: Once | INTRAMUSCULAR | Status: AC
  Administered 2023-09-17: 4 mg via INTRAVENOUS
  Filled 2023-09-17: qty 2

## 2023-09-17 NOTE — ED Triage Notes (Addendum)
 Emesis, diarrhea, SOB since 1700 came on suddenly. Some generalized abd pain and chest pressure. Hypertensive. Tired feeling, afebrile. Admits to marijuana use last night denies recent EtOH.

## 2023-09-17 NOTE — Telephone Encounter (Signed)
   Pre-operative Risk Assessment    Patient Name: [name]  DOB: [birth year] MRN: [id number]   Date of last office visit: 06/10/23 ROSALINE BANE, NP Date of next office visit: NONE   Request for Surgical Clearance    Procedure:  BIOPSY  Date of Surgery:  Clearance TBD                                Surgeon:  DR LONZO NOTTINGHAM Surgeon's Group or Practice Name:  [facility] Phone number:  [phone] Fax number:  [phone]   Type of Clearance Requested:   - Medical  - Pharmacy:  Hold Aspirin  5 DAYS PRIOR   Type of Anesthesia:  Not Indicated   Additional requests/questions:    Signed, Lucie DELENA Ku   09/17/2023, 4:12 PM

## 2023-09-18 ENCOUNTER — Emergency Department (HOSPITAL_BASED_OUTPATIENT_CLINIC_OR_DEPARTMENT_OTHER)
Admission: EM | Admit: 2023-09-18 | Discharge: 2023-09-18 | Disposition: A | Discharge status: Home / Self Care | Attending: Emergency Medicine | Admitting: Emergency Medicine

## 2023-09-18 ENCOUNTER — Emergency Department (HOSPITAL_BASED_OUTPATIENT_CLINIC_OR_DEPARTMENT_OTHER)

## 2023-09-18 DIAGNOSIS — R112 Nausea with vomiting, unspecified: Secondary | ICD-10-CM

## 2023-09-18 DIAGNOSIS — K3189 Other diseases of stomach and duodenum: Secondary | ICD-10-CM | POA: Diagnosis not present

## 2023-09-18 DIAGNOSIS — K4090 Unilateral inguinal hernia, without obstruction or gangrene, not specified as recurrent: Secondary | ICD-10-CM | POA: Diagnosis not present

## 2023-09-18 DIAGNOSIS — K439 Ventral hernia without obstruction or gangrene: Secondary | ICD-10-CM | POA: Diagnosis not present

## 2023-09-18 DIAGNOSIS — D3502 Benign neoplasm of left adrenal gland: Secondary | ICD-10-CM | POA: Diagnosis not present

## 2023-09-18 LAB — TROPONIN T, HIGH SENSITIVITY
Troponin T High Sensitivity: <15 ng/L (ref <19)
Troponin T High Sensitivity: <15 ng/L (ref <19)

## 2023-09-18 LAB — COMPREHENSIVE METABOLIC PANEL WITH GFR
ALT: 17 U/L (ref 0–44)
AST: 20 U/L (ref 15–41)
Albumin: 4.3 g/dL (ref 3.5–5.0)
Alkaline Phosphatase: 87 U/L (ref 38–126)
Anion gap: 15 (ref 5–15)
BUN: 21 mg/dL (ref 8–23)
CO2: 24 mmol/L (ref 22–32)
Calcium: 10.6 mg/dL — ABNORMAL HIGH (ref 8.9–10.3)
Chloride: 100 mmol/L (ref 98–111)
Creatinine, Ser: 0.76 mg/dL (ref 0.61–1.24)
GFR, Estimated: >60 mL/min (ref >60)
Glucose, Bld: 189 mg/dL — ABNORMAL HIGH (ref 70–99)
Potassium: 4.1 mmol/L (ref 3.5–5.1)
Sodium: 139 mmol/L (ref 135–145)
Total Bilirubin: 0.4 mg/dL (ref 0.0–1.2)
Total Protein: 7.9 g/dL (ref 6.5–8.1)

## 2023-09-18 LAB — LIPASE, BLOOD: Lipase: 14 U/L (ref 11–51)

## 2023-09-18 LAB — RESP PANEL BY RT-PCR (RSV, FLU A&B, COVID)  RVPGX2
Influenza A by PCR: NEGATIVE
Influenza B by PCR: NEGATIVE
Resp Syncytial Virus by PCR: NEGATIVE
SARS Coronavirus 2 by RT PCR: NEGATIVE

## 2023-09-18 MED ORDER — ONDANSETRON 4 MG PO TBDP: ORAL_TABLET | ORAL | 0 refills | Status: DC

## 2023-09-18 MED ORDER — DIAZEPAM 5 MG/ML IJ SOLN
2.5000 mg | Freq: Once | INTRAMUSCULAR | Status: AC
  Administered 2023-09-18: 2.5 mg via INTRAVENOUS
  Filled 2023-09-18: qty 2

## 2023-09-18 MED ORDER — PROMETHAZINE HCL 25 MG RE SUPP: 25.0000 mg | Freq: Four times a day (QID) | RECTAL | 0 refills | Status: DC | PRN

## 2023-09-18 MED ORDER — METOCLOPRAMIDE HCL 5 MG/ML IJ SOLN
10.0000 mg | Freq: Once | INTRAMUSCULAR | Status: AC
  Administered 2023-09-18: 10 mg via INTRAVENOUS
  Filled 2023-09-18: qty 2

## 2023-09-18 MED ORDER — IOHEXOL 300 MG/ML  SOLN
100.0000 mL | Freq: Once | INTRAMUSCULAR | Status: AC | PRN
  Administered 2023-09-18: 100 mL via INTRAVENOUS

## 2023-09-18 NOTE — ED Provider Notes (Signed)
 [place] EMERGENCY DEPARTMENT AT [facility] Provider Note   CSN: [id number] Arrival date & time: 09/17/23  2311     Patient presents with: Emesis   [name] is a 68 y.o. male.   Presents to the emergency department for evaluation of       Prior to Admission medications   Medication Sig Start Date End Date Taking? Authorizing Provider  ondansetron  (ZOFRAN -ODT) 4 MG disintegrating tablet 4mg  ODT q4 hours prn nausea/vomit 09/18/23  Yes Kameron Glazebrook, Lonni PARAS, MD  promethazine  (PHENERGAN ) 25 MG suppository Place 1 suppository (25 mg total) rectally every 6 (six) hours as needed for nausea or vomiting. 09/18/23  Yes Jamear Carbonneau, Lonni PARAS, MD  aspirin  EC 81 MG tablet Take 1 tablet (81 mg total) by mouth daily. Swallow whole. Patient taking differently: Take 81 mg by mouth at bedtime. Swallow whole. 05/15/23   Nahser, Aleene PARAS, MD  cholecalciferol (VITAMIN D3) 25 MCG (1000 UNIT) tablet Take 1,000 Units by mouth at bedtime.    [provider]  diazepam  (VALIUM ) 10 MG tablet Take 10 mg by mouth at bedtime. 05/05/23   [provider]  DULoxetine (CYMBALTA) 60 MG capsule Take 60 mg by mouth every evening. 08/22/20   [provider]  ezetimibe  (ZETIA ) 10 MG tablet Take 1 tablet (10 mg total) by mouth daily. Patient taking differently: Take 10 mg by mouth at bedtime. 04/13/23   Swinyer, Rosaline HERO, NP  Ferrous Sulfate (IRON PO) Take 1 tablet by mouth every other day.    [provider]  FIBER GUMMIES PO Take 2 capsules by mouth at bedtime.    [provider]  methocarbamol  (ROBAXIN ) 750 MG tablet Take 750 mg by mouth 2 (two) times daily as needed for muscle spasms.    [provider]  metoprolol  succinate (TOPROL  XL) 25 MG 24 hr tablet Take 0.5 tablets (12.5 mg total) by mouth daily. 05/18/23 05/17/24  End, Lonni, MD  Multiple Vitamin tablet Take 1 tablet by mouth at bedtime.    [provider]  pantoprazole  (PROTONIX) 40 MG tablet Take 40 mg by mouth at bedtime. 05/27/21   [provider]  rosuvastatin  (CRESTOR ) 20 MG tablet Take 1 tablet (20 mg total) by mouth at bedtime. 05/18/23   End, Lonni, MD  sertraline (ZOLOFT) 100 MG tablet Take 200 mg by mouth at bedtime. 03/14/16   [provider]    Allergies: Patient has no known allergies.    Review of Systems  Updated Vital Signs BP (!) 187/105   Pulse 68   Temp 98.3 F (36.8 C)   Resp 12   Ht 5' 9 (1.753 m)   Wt 77.1 kg   SpO2 97%   BMI 25.10 kg/m   Physical Exam  (all labs ordered are listed, but only abnormal results are displayed) Labs Reviewed  CBC - Abnormal; Notable for the following components:      Result Value   WBC 17.9 (*)    Platelets 411 (*)    All other components within normal limits  COMPREHENSIVE METABOLIC PANEL WITH GFR - Abnormal; Notable for the following components:   Glucose, Bld 189 (*)    Calcium  10.6 (*)    All other components within normal limits  URINALYSIS, ROUTINE W REFLEX MICROSCOPIC - Abnormal; Notable for the following components:   APPearance CLOUDY (*)    Ketones, ur TRACE (*)    Leukocytes,Ua SMALL (*)    Bacteria, UA RARE (*)    All other  components within normal limits  RESP PANEL BY RT-PCR (RSV, FLU A&B, COVID)  RVPGX2  LIPASE, BLOOD  TROPONIN T, HIGH SENSITIVITY  TROPONIN T, HIGH SENSITIVITY    EKG: EKG Interpretation Date/Time:  Thursday September 17 2023 23:23:08 EDT Ventricular Rate:  67 PR Interval:  156 QRS Duration:  96 QT Interval:  404 QTC Calculation: 426 R Axis:   7  Text Interpretation: Sinus rhythm with Premature supraventricular complexes Nonspecific ST abnormality Abnormal ECG When compared with ECG of 18-May-2023 11:06, Left anterior fascicular block is no longer Present Borderline criteria for Inferior infarct are no longer Present Confirmed by Haze Lonni PARAS [phone]) on 09/17/2023 11:33:30 PM  Radiology: CT ABDOMEN PELVIS W  CONTRAST Result Date: 09/18/2023 CLINICAL DATA:  Abdominal pain, acute, nonlocalized. Emesis, diarrhea and shortness of breath onset 5 p.m. yesterday. EXAM: CT ABDOMEN AND PELVIS WITH CONTRAST TECHNIQUE: Multidetector CT imaging of the abdomen and pelvis was performed using the standard protocol following bolus administration of intravenous contrast. RADIATION DOSE REDUCTION: This exam was performed according to the departmental dose-optimization program which includes automated exposure control, adjustment of the mA and/or kV according to patient size and/or use of iterative reconstruction technique. CONTRAST:  OMNIPAQUE  IOHEXOL  300 MG/ML  SOLN COMPARISON:  CTs with IV contrast 06/17/2021 and 11/22/2003. FINDINGS: Lower chest: There is calcification in the right coronary artery. The cardiac size is normal. No acute lung base finding. Hepatobiliary: There is a 1.4 cm cyst, Hounsfield density is 19, in liver segment 6. No follow-up imaging recommended. Rest of the liver is homogeneous. The gallbladder and bile ducts are unremarkable. Pancreas: Mild atrophy in the head and neck. No mass enhancement, ductal dilatation or inflammation. Spleen: Prior splenectomy. Stable 4.0 x 2.2 cm splenule left subphrenic space. Adrenals/Urinary Tract: Stable 1.4 cm left adrenal adenoma. No right adrenal abnormality. Left kidney is unremarkable. On the right, there is a 1.6 cm cyst posteriorly in the midpole, Hounsfield density is 23 consistent with a Bosniak 2 cyst. There is a Bosniak 1 cyst in the right inferior pole measuring 1.7 cm and 18 Hounsfield units. There are additional scattered too small to characterize Bosniak 2 subcentimeter cysts. No follow-up imaging is recommended. There is no urinary stone or obstruction. No bladder thickening. Stomach/Bowel: There is mild dilatation and fold thickening and stomach, which was seen previously. This could indicate chronic impaired gastric emptying but no outlet obstructing mass  is seen. The unopacified small bowel is normal caliber there are no inflammatory changes. The appendix is normal. There is mild fecal stasis ascending colon. No evidence of colitis or diverticulitis. Vascular/Lymphatic: Heavy aortic and branch vessel atherosclerosis. Progressive calcification noted right common iliac artery with probable interval occlusion. 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 There is no AAA.  No adenopathy is seen. Reproductive: Enlarged prostate, 4.8 cm transverse. Other: Small subxiphoid ventral fat hernia right of midline. Small inguinal fat hernias larger on the right. No incarcerated hernia. No free fluid or free air. Musculoskeletal: Advanced degenerative change lumbar spine. No acute or other significant osseous findings. IMPRESSION: 1. No acute CT findings in the abdomen or pelvis. 2. Mild dilatation and fold thickening of the stomach, which was seen previously. This could indicate chronic impaired gastric emptying but no outlet obstructing mass is seen. 3. Constipation. 4. Aortic and branch vessel atherosclerosis. Progressive calcification right common iliac artery with high likelihood of interval occlusion. 5. Stable 1.4 cm  left adrenal adenoma. 6. Prostatomegaly. 7. Small subxiphoid ventral fat hernia right of midline. Small inguinal fat hernias larger on the right. 8. Advanced degenerative change lumbar spine. Aortic Atherosclerosis (ICD10-I70.0). Electronically Signed   By: Francis Quam M.D.   On: 09/18/2023 04:34   DG Chest 2 View Result Date: 09/17/2023 CLINICAL DATA:  Chest pain short of breath EXAM: CHEST - 2 VIEW COMPARISON:  06/17/2021, CT 01/21/2023 FINDINGS: No acute airspace disease or effusion. Stable cardiomediastinal silhouette. No pneumothorax. Mild aortic atherosclerosis IMPRESSION: No active cardiopulmonary disease. Electronically Signed   By: Luke Bun  M.D.   On: 09/17/2023 23:45     Procedures   Medications Ordered in the ED  ondansetron  (ZOFRAN ) injection 4 mg (4 mg Intravenous Given 09/17/23 2331)  metoCLOPramide  (REGLAN ) injection 10 mg (10 mg Intravenous Given 09/18/23 0125)  diazepam  (VALIUM ) injection 2.5 mg (2.5 mg Intravenous Given 09/18/23 0409)  iohexol  (OMNIPAQUE ) 300 MG/ML solution 100 mL (100 mLs Intravenous Contrast Given 09/18/23 0351)                                    Medical Decision Making Amount and/or Complexity of Data Reviewed Labs: ordered. Radiology: ordered.  Risk Prescription drug management.   Presents to the emergency department for evaluation of acute onset nausea and vomiting.  He did have some loose stools initially but this has resolved.  Patient reports that he has vomited multiple times.  His upper abdomen is sore now from vomiting but no specific abdominal pain.  Reviewing his records reveals a cardiac history.  He is not experiencing any chest pain currently.  EKG without ischemic changes.  Troponin negative x 2.  Symptoms not consistent with cardiac etiology.  Patient's blood work was largely unremarkable.  He did have an elevated white blood cell count.  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  No overt signs of infection.  Chest x-ray without pneumonia.  CT abdomen and pelvis without acute abnormality.  Patient did report improvement of his nausea with Reglan .  He did not feel that the Zofran  helped.  He did get mild agitation after the Reglan  which resolved with Valium .  He is feeling much better, repeat examination is unremarkable.  Patient will be appropriate for discharge and outpatient follow-up for repeat blood pressure check with primary care.     Final diagnoses:  Nausea and vomiting, unspecified vomiting type    ED Discharge Orders          Ordered    ondansetron  (ZOFRAN -ODT) 4 MG  disintegrating tablet        09/18/23 0529    promethazine  (PHENERGAN ) 25 MG suppository  Every 6 hours PRN        09/18/23 0529               Haze Lonni PARAS, MD 09/18/23 [phone]

## 2023-09-18 NOTE — Telephone Encounter (Signed)
   Patient Name: [name]  DOB: [birth year] MRN: [id number]  Primary Cardiologist: Shelda Bruckner, MD  Chart reviewed as part of pre-operative protocol coverage. Given past medical history and time since last visit, based on ACC/AHA guidelines, [name] is at acceptable risk for the planned procedure without further cardiovascular testing.   If needed, patient may hold aspirin  for 5 days prior to the procedure and restart as soon as possible afterward at the surgeon's discretion.  The patient was advised that if he develops new symptoms prior to surgery to contact our office to arrange for a follow-up visit, and he verbalized understanding.  I will route this recommendation to the requesting party via Epic fax function and remove from pre-op pool.  Please call with questions.  Aloria Looper, [place] 09/18/2023, 11:00 AM

## 2023-09-27 ENCOUNTER — Other Ambulatory Visit

## 2023-09-30 DIAGNOSIS — R972 Elevated prostate specific antigen [PSA]: Secondary | ICD-10-CM | POA: Diagnosis not present

## 2023-09-30 DIAGNOSIS — C61 Malignant neoplasm of prostate: Secondary | ICD-10-CM | POA: Diagnosis not present

## 2023-10-09 ENCOUNTER — Other Ambulatory Visit (HOSPITAL_COMMUNITY): Payer: Self-pay | Admitting: Urology

## 2023-10-09 DIAGNOSIS — C61 Malignant neoplasm of prostate: Secondary | ICD-10-CM

## 2023-10-19 ENCOUNTER — Encounter (HOSPITAL_COMMUNITY)
Admission: RE | Admit: 2023-10-19 | Discharge: 2023-10-19 | Disposition: A | Discharge status: Home / Self Care | Source: Ambulatory Visit | Attending: Urology | Admitting: Urology

## 2023-10-19 DIAGNOSIS — C61 Malignant neoplasm of prostate: Secondary | ICD-10-CM | POA: Insufficient documentation

## 2023-10-19 MED ORDER — FLOTUFOLASTAT F 18 GALLIUM 296-5846 MBQ/ML IV SOLN
8.6000 | Freq: Once | INTRAVENOUS | Status: AC
  Administered 2023-10-19 (×2): 8.6 via INTRAVENOUS

## 2023-10-26 DIAGNOSIS — C61 Malignant neoplasm of prostate: Secondary | ICD-10-CM | POA: Diagnosis not present

## 2023-10-31 ENCOUNTER — Other Ambulatory Visit: Payer: Self-pay | Admitting: Internal Medicine

## 2023-11-02 ENCOUNTER — Telehealth: Payer: Self-pay | Admitting: Radiation Oncology

## 2023-11-02 NOTE — Telephone Encounter (Signed)
 Left message for patient to call back to schedule consult per 8/20 referral.

## 2023-11-03 NOTE — Telephone Encounter (Signed)
 Please advise for refill. Medication last filled by [facility] in [place].

## 2023-11-04 ENCOUNTER — Other Ambulatory Visit: Payer: Self-pay | Admitting: Urology

## 2023-11-05 ENCOUNTER — Telehealth: Payer: Self-pay | Admitting: Nurse Practitioner

## 2023-11-05 ENCOUNTER — Telehealth: Payer: Self-pay | Admitting: *Deleted

## 2023-11-05 NOTE — Telephone Encounter (Signed)
 S/w the pt and his wife and pt has been scheduled tele preop appt 11/20/23. Med rec and consent are done.     Patient Consent for Virtual Visit        [name] has provided verbal consent on 11/05/2023 for a virtual visit (video or telephone).   CONSENT FOR VIRTUAL VISIT FOR:  [name]  By participating in this virtual visit I agree to the following:  I hereby voluntarily request, consent and authorize [facility] and its employed or contracted physicians, physician assistants, nurse practitioners or other licensed health care professionals (the Practitioner), to provide me with telemedicine health care services (the "Services) as deemed necessary by the treating Practitioner. I acknowledge and consent to receive the Services by the Practitioner via telemedicine. I understand that the telemedicine visit will involve communicating with the Practitioner through live audiovisual communication technology and the disclosure of certain medical information by electronic transmission. I acknowledge that I have been given the opportunity to request an in-person assessment or other available alternative prior to the telemedicine visit and am voluntarily participating in the telemedicine visit.  I understand that I have the right to withhold or withdraw my consent to the use of telemedicine in the course of my care at any time, without affecting my right to future care or treatment, and that the Practitioner or I may terminate the telemedicine visit at any time. I understand that I have the right to inspect all information obtained and/or recorded in the course of the telemedicine visit and may receive copies of available information for a reasonable fee.  I understand that some of the potential risks of receiving the Services via telemedicine include:  Delay or interruption in medical evaluation due to technological equipment failure or disruption; Information transmitted may not be  sufficient (e.g. poor resolution of images) to allow for appropriate medical decision making by the Practitioner; and/or  In rare instances, security protocols could fail, causing a breach of personal health information.  Furthermore, I acknowledge that it is my responsibility to provide information about my medical history, conditions and care that is complete and accurate to the best of my ability. I acknowledge that Practitioner's advice, recommendations, and/or decision may be based on factors not within their control, such as incomplete or inaccurate data provided by me or distortions of diagnostic images or specimens that may result from electronic transmissions. I understand that the practice of medicine is not an exact science and that Practitioner makes no warranties or guarantees regarding treatment outcomes. I acknowledge that a copy of this consent can be made available to me via my patient portal [facility] MyChart), or I can request a printed copy by calling the office of [facility].    I understand that my insurance will be billed for this visit.   I have read or had this consent read to me. I understand the contents of this consent, which adequately explains the benefits and risks of the Services being provided via telemedicine.  I have been provided ample opportunity to ask questions regarding this consent and the Services and have had my questions answered to my satisfaction. I give my informed consent for the services to be provided through the use of telemedicine in my medical care

## 2023-11-05 NOTE — Telephone Encounter (Signed)
 S/w the pt and his wife and pt has been scheduled tele preop appt 11/20/23. Med rec and consent are done.

## 2023-11-05 NOTE — Telephone Encounter (Signed)
   [facility] Pre-operative Risk Assessment    Request for surgical clearance:  What type of surgery is being performed?  Robotic Prostatectomy    When is this surgery scheduled?  12/03/23   What type of clearance is required (medical clearance vs. Pharmacy clearance to hold med vs. Both)?  Both   Are there any medications that need to be held prior to surgery and how long?  Deferring to cardiology. Please advise on medications.  Practice name and name of physician performing surgery?  [facility]  Dr. Shane    What is your office phone number? [phone] (ext#: [phone])    7.   What is your office fax number? [phone]  8.   Anesthesia type (None, local, MAC, general)?  General    [name] 11/05/2023, 9:08 AM

## 2023-11-05 NOTE — Telephone Encounter (Signed)
   Name: [name]  DOB: 24-Jan-1956  MRN: [id number]  Primary Cardiologist: Shelda Bruckner, MD   Preoperative team, please contact this patient and set up a phone call appointment for further preoperative risk assessment. Please obtain consent and complete medication review. Thank you for your help.  I confirm that guidance regarding antiplatelet and oral anticoagulation therapy has been completed and, if necessary, noted below.  If needed, patient may hold aspirin  for 5 days prior to the procedure and restart as soon as possible afterward at the surgeon's discretion.    I also confirmed the patient resides in the state of [place] . As per [facility] telemedicine laws, the patient must reside in the state in which the provider is licensed.   Wyn Raddle, Jackee Shove, NP 11/05/2023, 9:26 AM [facility]

## 2023-11-17 ENCOUNTER — Ambulatory Visit

## 2023-11-17 ENCOUNTER — Ambulatory Visit: Admitting: Radiation Oncology

## 2023-11-18 ENCOUNTER — Inpatient Hospital Stay: Admitting: Oncology

## 2023-11-18 ENCOUNTER — Inpatient Hospital Stay: Attending: Oncology

## 2023-11-18 ENCOUNTER — Encounter: Payer: Self-pay | Admitting: Oncology

## 2023-11-18 ENCOUNTER — Other Ambulatory Visit: Payer: Self-pay | Admitting: Oncology

## 2023-11-18 VITALS — BP 120/70 | HR 65 | Temp 97.9°F | Resp 17 | Ht 69.0 in | Wt 166.0 lb

## 2023-11-18 DIAGNOSIS — D72829 Elevated white blood cell count, unspecified: Secondary | ICD-10-CM | POA: Diagnosis not present

## 2023-11-18 DIAGNOSIS — Z79899 Other long term (current) drug therapy: Secondary | ICD-10-CM | POA: Insufficient documentation

## 2023-11-18 DIAGNOSIS — C61 Malignant neoplasm of prostate: Secondary | ICD-10-CM

## 2023-11-18 DIAGNOSIS — Z8052 Family history of malignant neoplasm of bladder: Secondary | ICD-10-CM | POA: Diagnosis not present

## 2023-11-18 DIAGNOSIS — R972 Elevated prostate specific antigen [PSA]: Secondary | ICD-10-CM

## 2023-11-18 DIAGNOSIS — Z7982 Long term (current) use of aspirin: Secondary | ICD-10-CM | POA: Insufficient documentation

## 2023-11-18 DIAGNOSIS — Z87891 Personal history of nicotine dependence: Secondary | ICD-10-CM | POA: Diagnosis not present

## 2023-11-18 LAB — CBC WITH DIFFERENTIAL (CANCER CENTER ONLY)
Abs Immature Granulocytes: 0.02 K/uL (ref 0.00–0.07)
Basophils Absolute: 0.1 K/uL (ref 0.0–0.1)
Basophils Relative: 1 %
Eosinophils Absolute: 0.2 K/uL (ref 0.0–0.5)
Eosinophils Relative: 3 %
HCT: 39.5 % (ref 39.0–52.0)
Hemoglobin: 13.1 g/dL (ref 13.0–17.0)
Immature Granulocytes: 0 %
Lymphocytes Relative: 27 %
Lymphs Abs: 2.2 K/uL (ref 0.7–4.0)
MCH: 29.5 pg (ref 26.0–34.0)
MCHC: 33.2 g/dL (ref 30.0–36.0)
MCV: 89 fL (ref 80.0–100.0)
Monocytes Absolute: 0.8 K/uL (ref 0.1–1.0)
Monocytes Relative: 11 %
Neutro Abs: 4.6 K/uL (ref 1.7–7.7)
Neutrophils Relative %: 58 %
Platelet Count: 348 K/uL (ref 150–400)
RBC: 4.44 MIL/uL (ref 4.22–5.81)
RDW: 14.1 % (ref 11.5–15.5)
WBC Count: 7.9 K/uL (ref 4.0–10.5)
nRBC: 0 % (ref 0.0–0.2)

## 2023-11-18 LAB — CMP (CANCER CENTER ONLY)
ALT: 19 U/L (ref 0–44)
AST: 22 U/L (ref 15–41)
Albumin: 4.1 g/dL (ref 3.5–5.0)
Alkaline Phosphatase: 76 U/L (ref 38–126)
Anion gap: 4 — ABNORMAL LOW (ref 5–15)
BUN: 23 mg/dL (ref 8–23)
CO2: 28 mmol/L (ref 22–32)
Calcium: 9.4 mg/dL (ref 8.9–10.3)
Chloride: 109 mmol/L (ref 98–111)
Creatinine: 0.71 mg/dL (ref 0.61–1.24)
GFR, Estimated: >60 mL/min (ref >60)
Glucose, Bld: 96 mg/dL (ref 70–99)
Potassium: 4.4 mmol/L (ref 3.5–5.1)
Sodium: 141 mmol/L (ref 135–145)
Total Bilirubin: 0.3 mg/dL (ref 0.0–1.2)
Total Protein: 7.6 g/dL (ref 6.5–8.1)

## 2023-11-18 LAB — LACTATE DEHYDROGENASE: LDH: 129 U/L (ref 98–192)

## 2023-11-18 NOTE — Assessment & Plan Note (Addendum)
 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 Current count is 11,200, slightly above normal.   Smoking cessation may reduce inflammation and leukocytosis.  After his consultation with us , labs on 06/08/2023 showed normal white count of 9400 with normal differential.  Hemoglobin normal at 15.4, platelet count normal at 370,000.  CMP was unremarkable except for glucose 166.  LDH, sed rate, CRP, TSH, iron studies were all within normal limits.  BCR/ABL 1 was negative.  JAK2 mutation analysis with reflex testing to include CALR, MPL, exon 12-15 mutations were all negative.  Flow cytometry of peripheral blood was also unremarkable.  Repeat labs today also showed normal white count of 7900 with normal differential.  Hemoglobin and platelet count are within normal limits.   Clinical picture is not indicative of myeloproliferative neoplasm.  Since patient quit smoking, we expect white count to remain stable.  Originally plan was to see him in 1 year for follow-up.  However given diagnosis of prostate cancer, we will see him sooner in clinic to follow-up on developments and any additional adjuvant treatments that he may need.

## 2023-11-18 NOTE — Assessment & Plan Note (Addendum)
 PSA level increased from 8 to 9.4 in June 2025.  MRI of the pelvis on 09/08/2023 showed PI-RADS 4.  He underwent prostate biopsy on 09/30/2023, performed by Dr. Steffan Pea.  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  Perineural invasion identified.    PET PSMA scan on 10/19/2023 showed right-greater-than-left prostatic tracer affinity likely represents 1 or more primaries. A focus of mild tracer affinity in long the right internal iliac vasculature may correspond to a diminutive node and is suspicious for isolated nodal metastasis.  Unfavorable, intermediate risk prostate cancer with possible metastasis to the right iliac node location.  Dr. Pea discussed options of surgery versus radiation treatments.  Discussed the possible need for adjuvant radiation even if he were to pursue surgery and given the extent of the disease, Dr. Pea did not think he will be able to nerve spare.  Patient opted to proceed with surgery.  This is currently scheduled on 12/03/2023.  Will defer management to urology and radiation oncology departments.

## 2023-11-18 NOTE — Assessment & Plan Note (Deleted)
 Severe night sweats requiring change of clothes and sheets, ongoing for approximately seven years. Potential causes include systemic inflammation due to smoking, thyroid dysfunction, or other systemic conditions. Further evaluation is needed to determine the underlying cause. The night sweats may improve over time, especially with smoking cessation.  TSH and iron studies were normal on 06/08/2023.  Previously he had CT chest, abdomen and pelvis in 2023 and 2024 which showed no findings to suggest lymphoma or other malignancy.  If symptoms were to persist on return visit, we will check testosterone, PSA and also consider pursuing repeat imaging studies.

## 2023-11-18 NOTE — Progress Notes (Signed)
 [facility]  HEMATOLOGY CLINIC PROGRESS NOTE  PATIENT NAME: [name]   MR#: [id number] DOB: [birth year]  Patient Care Team: Gerome Brunet, DO as PCP - General (Family Medicine) Lonni Slain, MD as PCP - Cardiology (Cardiology)  Date of visit: 11/18/2023   ASSESSMENT & PLAN:   [name] is a 68 y.o.  pleasant gentleman with past medical history of hyperlipidemia, prediabetes, degenerative joint disease, previous tobacco smoking, was referred to our clinic in March 2025 for further evaluation of leukocytosis.    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 Current count is 11,200, slightly above normal.   Smoking cessation may reduce inflammation and leukocytosis.  After his consultation with us , labs on 06/08/2023 showed normal white count of 9400 with normal differential.  Hemoglobin normal at 15.4, platelet count normal at 370,000.  CMP was unremarkable except for glucose 166.  LDH, sed rate, CRP, TSH, iron studies were all within normal limits.  BCR/ABL 1 was negative.  JAK2 mutation analysis with reflex testing to include CALR, MPL, exon 12-15 mutations were all negative.  Flow cytometry of peripheral blood was also unremarkable.  Repeat labs today also showed normal white count of 7900 with normal differential.  Hemoglobin and platelet count are within normal limits.   Clinical picture is not indicative of myeloproliferative neoplasm.  Since patient quit smoking, we expect white count to remain stable.  Originally plan was to see him in 1 year for follow-up.  However given diagnosis of prostate cancer, we will see him sooner in clinic to follow-up on developments and any additional adjuvant treatments that he may need.  Prostate cancer (HCC) PSA level increased from 8 to 9.4 in June 2025.  MRI of the pelvis on 09/08/2023 showed PI-RADS 4.  He underwent prostate biopsy on  09/30/2023, performed by Dr. Steffan Pea.  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  Perineural invasion identified.    PET PSMA scan on 10/19/2023 showed right-greater-than-left prostatic tracer affinity likely represents 1 or more primaries. A focus of mild tracer affinity in long the right internal iliac vasculature may correspond to a diminutive node and is suspicious for isolated nodal metastasis.  Unfavorable, intermediate risk prostate cancer with possible metastasis to the right iliac node location.  Dr. Pea discussed options of surgery versus radiation treatments.  Discussed the possible need for adjuvant radiation even if he were to pursue surgery and given the extent of the disease, Dr. Pea did not think he will be able to nerve spare.  Patient opted to proceed with surgery.  This is currently scheduled on 12/03/2023.  Will defer management to urology and radiation oncology departments.    I spent a total of 30 minutes during this encounter with the patient including review of chart and various tests results, discussions about plan of care and coordination of care plan.  I reviewed lab results and outside records for this visit and discussed relevant results with the patient. Diagnosis, plan of care and treatment options were also discussed in detail with the patient. Opportunity provided to ask questions and answers provided to his apparent satisfaction. Provided instructions to call our clinic with any problems, questions or concerns prior to return visit. I recommended to continue follow-up with PCP and sub-specialists. He verbalized understanding and agreed with the plan. No barriers to learning was detected.  Chinita Patten, MD  11/18/2023 12:08 PM  [facility]  [facility] - A DEPT OF [facility] [address] [place] [address] Dept: [phone] Dept Fax: [phone]   CHIEF  COMPLAINT/ REASON FOR VISIT:  Follow-up for history of leukocytosis.  Negative hematological workup.  Likely reactionary to previous tobacco use.  He quit smoking in March 2025.  Diagnosed with prostate cancer, Gleason grade 3+4=7 in July 2025.  INTERVAL HISTORY:  Discussed the use of AI scribe software for clinical note transcription with the patient, who gave verbal consent to proceed.  History of Present Illness [name] is a 68 year old male who presents with elevated PSA levels for further evaluation. He was referred by his primary doctor for elevated PSA levels.  His PSA levels were initially checked and found to be elevated, prompting a referral to a urologist. The urologist noted an increase in PSA from 8 to 9.4, leading to the scheduling of an MRI and a planned biopsy. The MRI is scheduled for Tuesday, September 07, 2023. No urinary symptoms such as hematuria, dysuria, or difficulty urinating.  He has a history of fluctuating white blood cell counts, previously as high as 19,000 since at least 2018, but his most recent count is normal at 8,100. Previous workup for leukemia was negative, and current labs show no indication of leukemia or bone marrow issues.  He regularly donates platelets, approximately every week, and his platelet count is typically around 1,300. His current hemoglobin is 13.4, and his platelet count is 210,000.  He has a history of smoking but quit in March 2025.   [name] is a 68 year old male with prostate cancer who presents for follow-up after recent diagnostic workup.  He has been undergoing evaluation and treatment planning for prostate cancer. An MRI was performed shortly after his last visit, leading to further workup including a biopsy in July and a PET scan. The biopsy revealed cancer in at least ten out of thirteen to fourteen samples.  He is scheduled for prostate surgery on December 03, 2023, with a possibility of lymph node involvement on  the right side, as a questionable lymph node was seen on the PET scan. He initially planned to pursue radiation but later opted for surgery instead.  His family history is significant for bladder cancer, as his father had cancer that spread to the lymph nodes after chemotherapy and passed away at the age of 71.  He has a history of elevated white blood cell count, which has normalized since he quit smoking. His current blood work shows normal white count, red count, platelet count, kidney, and liver function tests.   SUMMARY OF HEMATOLOGIC HISTORY:  On 05/15/2023, labs showed white count of 19,600.  No differential was obtained.  Hemoglobin and platelet count were within normal limits.  He was referred to us  for further evaluation of leukocytosis.   On review of records, he has had chronic, intermittent leukocytosis with white count ranging anywhere between 13,000 to 19,000 since 2018 at least.   He denies recent infection. The last prescription antibiotics was more than 3 months ago.    Patient denies sinus congestion, cough, urinary frequency/urgency or dysuria, diarrhea, joint swelling/pain or abnormal skin rash.    The patient reports experiencing severe night sweats for the past seven years, which are so intense that he often requires changing clothes and sheets. The patient denies any fever and reports a good appetite. The patient quit smoking four weeks ago upon medical advice. The patient denies any  history of steroid use.   He had no prior history or diagnosis of cancer. His age appropriate screening programs are up-to-date.   The patient has no prior diagnosis of autoimmune disease and was not prescribed corticosteroids related products.   Smoking cessation may reduce inflammation and leukocytosis.   After his consultation with us , labs on 06/08/2023 showed normal white count of 9400 with normal differential.  Hemoglobin normal at 15.4, platelet count normal at 370,000.  CMP was  unremarkable except for glucose 166.  LDH, sed rate, CRP, TSH, iron studies were all within normal limits.  BCR/ABL 1 was negative.  JAK2 mutation analysis with reflex testing to include CALR, MPL, exon 12-15 mutations were all negative.  Flow cytometry of peripheral blood was also unremarkable.   Clinical picture is not indicative of myeloproliferative neoplasm.  Since patient quit smoking, we expect white count to remain stable.   He was diagnosed with prostate adenocarcinoma Gleason grade 3+4=7 in July 2025.  Plan is to proceed with prostatectomy on 12/03/2023.  I have reviewed the past medical history, past surgical history, social history and family history with the patient and they are unchanged from previous note.  ALLERGIES: He has no known allergies.  MEDICATIONS:  Current Outpatient Medications  Medication Sig Dispense Refill   aspirin  EC 81 MG tablet Take 1 tablet (81 mg total) by mouth daily. Swallow whole. (Patient taking differently: Take 81 mg by mouth at bedtime. Swallow whole.)     cholecalciferol (VITAMIN D3) 25 MCG (1000 UNIT) tablet Take 1,000 Units by mouth at bedtime.     diazepam  (VALIUM ) 10 MG tablet Take 10 mg by mouth at bedtime.     DULoxetine (CYMBALTA) 60 MG capsule Take 60 mg by mouth every evening.     ezetimibe  (ZETIA ) 10 MG tablet Take 1 tablet (10 mg total) by mouth daily. (Patient taking differently: Take 10 mg by mouth at bedtime.) 90 tablet 3   methocarbamol  (ROBAXIN ) 750 MG tablet Take 750 mg by mouth 2 (two) times daily as needed for muscle spasms.     metoprolol  succinate (TOPROL  XL) 25 MG 24 hr tablet Take 0.5 tablets (12.5 mg total) by mouth daily. 30 tablet 5   Multiple Vitamin tablet Take 1 tablet by mouth at bedtime.     pantoprazole (PROTONIX) 40 MG tablet Take 40 mg by mouth at bedtime.     rosuvastatin  (CRESTOR ) 20 MG tablet TAKE 1 TABLET BY MOUTH AT BEDTIME 30 tablet 0   sertraline (ZOLOFT) 100 MG tablet Take 200 mg by mouth at bedtime.  0   No  current facility-administered medications for this visit.     REVIEW OF SYSTEMS:    Review of Systems - Oncology  All other pertinent systems were reviewed with the patient and are negative.  PHYSICAL EXAMINATION:    Onc Performance Status - 11/18/23 0954       ECOG Perf Status   ECOG Perf Status Restricted in physically strenuous activity but ambulatory and able to carry out work of a light or sedentary nature, e.g., light house work, office work      KPS SCALE   KPS % SCORE Normal activity with effort, some s/s of disease           Vitals:   11/18/23 0946  BP: 120/70  Pulse: 65  Resp: 17  Temp: 97.9 F (36.6 C)  SpO2: 98%    Filed Weights   11/18/23 0946  Weight: 166 lb (75.3 kg)  Physical Exam Constitutional:      General: He is not in acute distress.    Appearance: Normal appearance.  HENT:     Head: Normocephalic and atraumatic.  Eyes:     Conjunctiva/sclera: Conjunctivae normal.  Cardiovascular:     Rate and Rhythm: Normal rate and regular rhythm.  Pulmonary:     Effort: Pulmonary effort is normal. No respiratory distress.  Abdominal:     General: There is no distension.  Neurological:     General: No focal deficit present.     Mental Status: He is alert and oriented to person, place, and time.  Psychiatric:        Mood and Affect: Mood normal.        Behavior: Behavior normal.     LABORATORY DATA:   I have reviewed the data as listed.  Results for orders placed or performed in visit on 11/18/23  CBC with Differential (Cancer Center Only)  Result Value Ref Range   WBC Count 7.9 4.0 - 10.5 K/uL   RBC 4.44 4.22 - 5.81 MIL/uL   Hemoglobin 13.1 13.0 - 17.0 g/dL   HCT 60.4 60.9 - 47.9 %   MCV 89.0 80.0 - 100.0 fL   MCH 29.5 26.0 - 34.0 pg   MCHC 33.2 30.0 - 36.0 g/dL   RDW 85.8 88.4 - 84.4 %   Platelet Count 348 150 - 400 K/uL   nRBC 0.0 0.0 - 0.2 %   Neutrophils Relative % 58 %   Neutro Abs 4.6 1.7 - 7.7 K/uL   Lymphocytes  Relative 27 %   Lymphs Abs 2.2 0.7 - 4.0 K/uL   Monocytes Relative 11 %   Monocytes Absolute 0.8 0.1 - 1.0 K/uL   Eosinophils Relative 3 %   Eosinophils Absolute 0.2 0.0 - 0.5 K/uL   Basophils Relative 1 %   Basophils Absolute 0.1 0.0 - 0.1 K/uL   Immature Granulocytes 0 %   Abs Immature Granulocytes 0.02 0.00 - 0.07 K/uL  CMP (Cancer Center only)  Result Value Ref Range   Sodium 141 135 - 145 mmol/L   Potassium 4.4 3.5 - 5.1 mmol/L   Chloride 109 98 - 111 mmol/L   CO2 28 22 - 32 mmol/L   Glucose, Bld 96 70 - 99 mg/dL   BUN 23 8 - 23 mg/dL   Creatinine 9.28 9.38 - 1.24 mg/dL   Calcium  9.4 8.9 - 10.3 mg/dL   Total Protein 7.6 6.5 - 8.1 g/dL   Albumin 4.1 3.5 - 5.0 g/dL   AST 22 15 - 41 U/L   ALT 19 0 - 44 U/L   Alkaline Phosphatase 76 38 - 126 U/L   Total Bilirubin 0.3 0.0 - 1.2 mg/dL   GFR, Estimated >39 >39 mL/min   Anion gap 4 (L) 5 - 15  Lactate dehydrogenase  Result Value Ref Range   LDH 129 98 - 192 U/L    RADIOGRAPHIC STUDIES:  No recent pertinent imaging studies available to review.  Orders Placed This Encounter  Procedures   CBC with Differential (Cancer Center Only)    Standing Status:   Future    Expected Date:   02/17/2024    Expiration Date:   05/17/2024   CMP (Cancer Center only)    Standing Status:   Future    Expected Date:   02/17/2024    Expiration Date:   05/17/2024   Lactate dehydrogenase    Standing Status:   Future    Expected  Date:   02/17/2024    Expiration Date:   05/17/2024     Future Appointments  Date Time Provider Department Center  11/20/2023  2:00 PM [facility] HVT PRE OP CLEARANCE APP [facility] H&V  11/25/2023 11:00 AM [facility] PAT 2 [facility] None  02/10/2024 11:15 AM [facility] LAB [facility] None  02/10/2024 11:45 AM Krithika Tome, Chinita, MD [facility] None     This document was completed utilizing speech recognition software. Grammatical errors, random word insertions, pronoun errors, and incomplete sentences are an occasional  consequence of this system due to software limitations, ambient noise, and hardware issues. Any formal questions or concerns about the content, text or information contained within the body of this dictation should be directly addressed to the provider for clarification.

## 2023-11-20 ENCOUNTER — Ambulatory Visit: Attending: Cardiology

## 2023-11-20 DIAGNOSIS — C61 Malignant neoplasm of prostate: Secondary | ICD-10-CM | POA: Diagnosis not present

## 2023-11-20 DIAGNOSIS — Z01810 Encounter for preprocedural cardiovascular examination: Secondary | ICD-10-CM | POA: Diagnosis not present

## 2023-11-20 DIAGNOSIS — Z01812 Encounter for preprocedural laboratory examination: Secondary | ICD-10-CM | POA: Diagnosis not present

## 2023-11-20 NOTE — Progress Notes (Signed)
 Virtual Visit via Telephone Note   Because of [name] co-morbid illnesses, he is at least at moderate risk for complications without adequate follow up.  This format is felt to be most appropriate for this patient at this time.  Due to technical limitations with video connection (technology), today's appointment will be conducted as an audio only telehealth visit, and [name] verbally agreed to proceed in this manner.   All issues noted in this document were discussed and addressed.  No physical exam could be performed with this format.  Evaluation Performed:  Preoperative cardiovascular risk assessment _____________   Date:  11/20/2023   Patient ID:  [name], DOB Jun 21, 1955, MRN [id number] Patient Location:  Home Provider location:   Office  Primary Care Provider:  Gerome Brunet, DO Primary Cardiologist:  Dalton Bruckner, MD  Chief Complaint / Patient Profile   68 y.o. 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  History of Present Illness    [name] is a 69 y.o. male who presents via audio/video conferencing for a telehealth visit today.  Pt was last seen in cardiology clinic on 06/10/2023 by Dalton Bane, NP.  At that time [name] was feeling a little sluggish following his cardiac catheterization.  The patient is now pending procedure as outlined above. Since his last visit, he is feeling okay without any issues.  No chest pain or shortness of breath.  No swelling in hands or feet.  He does have some hip trouble which holds him back from walking longer distances.  However, he does meet the minimum requirements of 4 METS on the  DASI.  He enjoys fishing as well.  If needed, patient may hold aspirin  for 5 days prior to the procedure and restart as soon as possible afterward at the surgeon's discretion.   Past Medical History    Past Medical History:  Diagnosis Date   Current smoker    Degenerative cervical disc    Depression    ED (erectile dysfunction)    Elevated PSA    GERD (gastroesophageal reflux disease)    HLD (hyperlipidemia)    Prediabetes    Spinal stenosis    Past Surgical History:  Procedure Laterality Date   LEFT HEART CATH AND CORONARY ANGIOGRAPHY N/A 05/18/2023   Procedure: LEFT HEART CATH AND CORONARY ANGIOGRAPHY;  Surgeon: Dalton Bruckner, MD;  Location: [facility];  Service: Cardiovascular;  Laterality: N/A;   MANDIBLE FRACTURE SURGERY     SPLENECTOMY     UMBILICAL HERNIA REPAIR      Allergies  No Known Allergies  Home Medications    Prior to Admission medications   Medication Sig Start Date Arellano Date Taking? Authorizing Provider  aspirin  EC 81 MG tablet Take 1 tablet (81 mg total) by mouth daily. Swallow whole. Patient taking differently: Take 81 mg by mouth at bedtime. Swallow whole. 05/15/23   Nahser, Aleene PARAS, MD  cholecalciferol (VITAMIN D3) 25 MCG (1000 UNIT) tablet Take 1,000 Units by mouth at bedtime.    [provider]  diazepam  (VALIUM ) 10 MG tablet Take 10 mg by mouth at bedtime. 05/05/23   [provider]  DULoxetine (CYMBALTA) 60 MG capsule Take 60 mg by mouth every evening. 08/22/20   [provider]  ezetimibe  (ZETIA ) 10 MG tablet Take 1 tablet (10 mg total) by mouth daily. Patient taking differently: Take 10 mg by mouth at bedtime. 04/13/23   Swinyer, Dalton HERO, NP  methocarbamol  (ROBAXIN ) 750 MG tablet Take 750 mg by mouth 2 (two) times daily as needed for muscle spasms.    [provider]  metoprolol  succinate (TOPROL  XL) 25 MG 24 hr tablet Take 0.5 tablets (12.5 mg total) by mouth daily. 05/18/23 05/17/24  Arellano, Lonni, MD   Multiple Vitamin tablet Take 1 tablet by mouth at bedtime.    [provider]  pantoprazole (PROTONIX) 40 MG tablet Take 40 mg by mouth at bedtime. 05/27/21   [provider]  rosuvastatin  (CRESTOR ) 20 MG tablet TAKE 1 TABLET BY MOUTH AT BEDTIME 11/03/23   Arellano, Lonni, MD  sertraline (ZOLOFT) 100 MG tablet Take 200 mg by mouth at bedtime. 03/14/16   [provider]    Physical Exam    Vital Signs:  [name] does not have vital signs available for review today.  Given telephonic nature of communication, physical exam is limited. AAOx3. NAD. Normal affect.  Speech and respirations are unlabored.  Accessory Clinical Findings    None  Assessment & Plan    1.  Preoperative Cardiovascular Risk Assessment:  [name] perioperative risk of a major cardiac event is 0.9% according to the Revised Cardiac Risk Index (RCRI).  Therefore, he is at low risk for perioperative complications.   His functional capacity is good at 6.21 METs according to the [facility] Activity Status Index (DASI). Recommendations: According to ACC/AHA guidelines, no further cardiovascular testing needed.  The patient may proceed to surgery at acceptable risk.   Antiplatelet and/or Anticoagulation Recommendations: Aspirin  can be held for 5 days prior to his surgery.  Please resume Aspirin  post operatively when it is felt to be safe from a bleeding standpoint.    The patient was advised that if he develops new symptoms prior to surgery to contact our office to arrange for a follow-up visit, and he verbalized understanding.   A copy of this note will be routed to requesting surgeon.  Time:   Today, I have spent 5 minutes with the patient with telehealth technology discussing medical history, symptoms, and management plan.     Dalton LOISE Fabry, PA-C  11/20/2023, 8:25 AM

## 2023-11-23 NOTE — Patient Instructions (Signed)
 SURGICAL WAITING ROOM VISITATION  Patients having surgery or a procedure may have no more than 2 support people in the waiting area - these visitors may rotate.    Children under the age of 55 must have an adult with them who is not the patient.  Visitors with respiratory illnesses are discouraged from visiting and should remain at home.  If the patient needs to stay at the hospital during part of their recovery, the visitor guidelines for inpatient rooms apply. Pre-op nurse will coordinate an appropriate time for 1 support person to accompany patient in pre-op.  This support person may not rotate.    Please refer to the [facility] website for the visitor guidelines for Inpatients (after your surgery is over and you are in a regular room).       Your procedure is scheduled on:  12/03/23    Report to [facility] Main Entrance    Report to admitting at  0630 AM   Call this number if you have problems the morning of surgery [phone]   Clear liquid diet the day before surgery.               Magneisum Citrate- 8 ounce at 12 noon day before surgery.               Fleets enema nite before surgery    Water Non-Citrus Juices (without pulp, NO RED-Apple, White grape, White cranberry) Black Coffee (NO MILK/CREAM OR CREAMERS, sugar ok)  Clear Tea (NO MILK/CREAM OR CREAMERS, sugar ok) regular and decaf                             Plain Jell-O (NO RED)                                           Fruit ices (not with fruit pulp, NO RED)                                     Popsicles (NO RED)                                                               Sports drinks like Gatorade (NO RED)                            If you have questions, please contact your surgeon's office.     Oral Hygiene is also important to reduce your risk of infection.                                    Remember - BRUSH YOUR TEETH THE MORNING OF SURGERY WITH YOUR REGULAR TOOTHPASTE  DENTURES WILL BE  REMOVED PRIOR TO SURGERY PLEASE DO NOT APPLY Poly grip OR ADHESIVES!!!   Do NOT smoke after Midnight   Stop all vitamins and herbal supplements 7 days before surgery.   Take these medicines the morning of surgery  with A SIP OF WATER:  toprol    DO NOT TAKE ANY ORAL DIABETIC MEDICATIONS DAY OF YOUR SURGERY  Bring CPAP mask and tubing day of surgery.                              You may not have any metal on your body including hair pins, jewelry, and body piercing             Do not wear make-up, lotions, powders, perfumes/cologne, or deodorant  Do not wear nail polish including gel and S&S, artificial/acrylic nails, or any other type of covering on natural nails including finger and toenails. If you have artificial nails, gel coating, etc. that needs to be removed by a nail salon please have this removed prior to surgery or surgery may need to be canceled/ delayed if the surgeon/ anesthesia feels like they are unable to be safely monitored.   Do not shave  48 hours prior to surgery.               Men may shave face and neck.   Do not bring valuables to the hospital. [facility] IS NOT             RESPONSIBLE   FOR VALUABLES.   Contacts, glasses, dentures or bridgework may not be worn into surgery.   Bring small overnight bag day of surgery.   DO NOT BRING YOUR HOME MEDICATIONS TO THE HOSPITAL. PHARMACY WILL DISPENSE MEDICATIONS LISTED ON YOUR MEDICATION LIST TO YOU DURING YOUR ADMISSION IN THE HOSPITAL!    Patients discharged on the day of surgery will not be allowed to drive home.  Someone NEEDS to stay with you for the first 24 hours after anesthesia.   Special Instructions: Bring a copy of your healthcare power of attorney and living will documents the day of surgery if you haven't scanned them before.              Please read over the following fact sheets you were given: IF YOU HAVE QUESTIONS ABOUT YOUR PRE-OP INSTRUCTIONS PLEASE CALL [phone].   If you received a COVID  test during your pre-op visit  it is requested that you wear a mask when out in public, stay away from anyone that may not be feeling well and notify your surgeon if you develop symptoms. If you test positive for Covid or have been in contact with anyone that has tested positive in the last 10 days please notify you surgeon.    [facility] - Preparing for Surgery Before surgery, you can play an important role.  Because skin is not sterile, your skin needs to be as free of germs as possible.  You can reduce the number of germs on your skin by washing with CHG (chlorahexidine gluconate) soap before surgery.  CHG is an antiseptic cleaner which kills germs and bonds with the skin to continue killing germs even after washing. Please DO NOT use if you have an allergy to CHG or antibacterial soaps.  If your skin becomes reddened/irritated stop using the CHG and inform your nurse when you arrive at Short Stay. Do not shave (including legs and underarms) for at least 48 hours prior to the first CHG shower.  You may shave your face/neck. Please follow these instructions carefully:  1.  Shower with CHG Soap the night before surgery and the  morning of Surgery.  2.  If you choose to  wash your hair, wash your hair first as usual with your  normal  shampoo.  3.  After you shampoo, rinse your hair and body thoroughly to remove the  shampoo.                           4.  Use CHG as you would any other liquid soap.  You can apply chg directly  to the skin and wash                       Gently with a scrungie or clean washcloth.  5.  Apply the CHG Soap to your body ONLY FROM THE NECK DOWN.   Do not use on face/ open                           Wound or open sores. Avoid contact with eyes, ears mouth and genitals (private parts).                       Wash face,  Genitals (private parts) with your normal soap.             6.  Wash thoroughly, paying special attention to the area where your surgery  will be performed.  7.   Thoroughly rinse your body with warm water from the neck down.  8.  DO NOT shower/wash with your normal soap after using and rinsing off  the CHG Soap.                9.  Pat yourself dry with a clean towel.            10.  Wear clean pajamas.            11.  Place clean sheets on your bed the night of your first shower and do not  sleep with pets. Day of Surgery : Do not apply any lotions/deodorants the morning of surgery.  Please wear clean clothes to the hospital/surgery center.  FAILURE TO FOLLOW THESE INSTRUCTIONS MAY RESULT IN THE CANCELLATION OF YOUR SURGERY PATIENT SIGNATURE_________________________________  NURSE SIGNATURE__________________________________  ________________________________________________________________________

## 2023-11-23 NOTE — Progress Notes (Signed)
 Anesthesia Review:  PCP: Cardiologist : Rosaline Percy PIETY LOV 06/10/23 clearance- 11/05/23   PPM/ ICD: Device Orders: Rep Notified:  Chest x-ray : 09/17/2023- 2 viw  EKG : 09/22/2023  Echo : Stress test: 05/14/2023  CT Card- 05/30/23  Cardiac Cath :  05/18/23   Activity level:  Sleep Study/ CPAP : Fasting Blood Sugar :      / Checks Blood Sugar -- times a day:    Blood Thinner/ Instructions /Last Dose: ASA / Instructions/ Last Dose :    81 mg aspirin     9/10./2025- cbc and CMP

## 2023-11-25 ENCOUNTER — Other Ambulatory Visit: Payer: Self-pay

## 2023-11-25 ENCOUNTER — Encounter (HOSPITAL_COMMUNITY)
Admission: RE | Admit: 2023-11-25 | Discharge: 2023-11-25 | Disposition: A | Discharge status: Home / Self Care | Source: Ambulatory Visit | Attending: Urology | Admitting: Urology

## 2023-11-25 ENCOUNTER — Encounter (HOSPITAL_COMMUNITY): Payer: Self-pay

## 2023-11-25 VITALS — BP 121/78 | HR 61 | Temp 98.1°F | Resp 16 | Ht 69.0 in | Wt 165.3 lb

## 2023-11-25 DIAGNOSIS — I2510 Atherosclerotic heart disease of native coronary artery without angina pectoris: Secondary | ICD-10-CM | POA: Insufficient documentation

## 2023-11-25 DIAGNOSIS — C61 Malignant neoplasm of prostate: Secondary | ICD-10-CM | POA: Insufficient documentation

## 2023-11-25 DIAGNOSIS — Z01818 Encounter for other preprocedural examination: Secondary | ICD-10-CM

## 2023-11-25 DIAGNOSIS — Z87891 Personal history of nicotine dependence: Secondary | ICD-10-CM | POA: Insufficient documentation

## 2023-11-25 DIAGNOSIS — Z01812 Encounter for preprocedural laboratory examination: Secondary | ICD-10-CM | POA: Diagnosis not present

## 2023-11-25 HISTORY — DX: Other specified postprocedural states: Z98.890

## 2023-11-25 HISTORY — DX: Malignant (primary) neoplasm, unspecified: C80.1

## 2023-11-25 HISTORY — DX: Anxiety disorder, unspecified: F41.9

## 2023-11-25 HISTORY — DX: Prediabetes: R73.03

## 2023-11-25 HISTORY — DX: Unspecified osteoarthritis, unspecified site: M19.90

## 2023-11-25 HISTORY — DX: Acute myocardial infarction, unspecified: I21.9

## 2023-11-25 HISTORY — DX: Headache, unspecified: R51.9

## 2023-11-26 NOTE — Anesthesia Preprocedure Evaluation (Addendum)
 Anesthesia Evaluation   Patient awake    Reviewed: Allergy & Precautions, NPO status , Patient's Chart, lab work & pertinent test results, reviewed documented beta blocker date and time   History of Anesthesia Complications (+) PONV and history of anesthetic complications  Airway Mallampati: II  TM Distance: >3 FB Neck ROM: Full    Dental  (+) Teeth Intact, Dental Advisory Given   Pulmonary neg shortness of breath, neg sleep apnea, neg COPD, neg recent URI, former smoker   breath sounds clear to auscultation       Cardiovascular + CAD and + Past MI   Rhythm:Regular   LEFT VENTRICLE:   -  Left ventricular size was normal.   -  Overall left ventricular systolic function was normal.   -  Left ventricular ejection fraction was estimated , range being 55         % to 60 %..   -  There was no diagnostic evidence of left ventricular regional         wall motion abnormalities.   -  Left ventricular wall thickness was normal.    Doppler interpretation(s):   -  Left ventricular diastolic function parameters were normal.   AORTIC VALVE:   -  The aortic valve was trileaflet.   -  Aortic valve thickness was normal.   -  There was normal aortic valve leaflet excursion.    Doppler interpretation(s):   -  Transaortic velocity was within the normal range.   -  There was no evidence for aortic valve stenosis.   -  There was no significant aortic valvular regurgitation.   AORTA:   -  The aortic root was normal in size.   MITRAL VALVE:   -  Mitral valve structure was normal.   -  There was normal mitral valve leaflet excursion.    Doppler interpretation(s):   -  The transmitral velocity was within the normal range.   -  There was no evidence for mitral stenosis.   -  There was no significant mitral valvular regurgitation.   LEFT ATRIUM:   -  Left atrial size was normal.   RIGHT VENTRICLE:   -  Right ventricular size was normal.   -   Right ventricular systolic function was normal.   -  Right ventricular wall thickness was normal.   PULMONIC VALVE:   -  The structure of the pulmonic valve appeared to be normal.    Doppler interpretation(s):   -  The transpulmonic velocity was within the normal range.   -  There was no pulmonic valve stenosis.   -  There was no significant pulmonic regurgitation.   TRICUSPID VALVE:   -  The tricuspid valve structure was normal.   -  Tricuspid leaflet excursion was normal.    Doppler interpretation(s):   -  The transtricuspid velocity was within the normal range.   -  There was no evidence for tricuspid stenosis.   -  There was trivial tricuspid valvular regurgitation.   RIGHT ATRIUM:   -  Right atrial size was normal.   SYSTEMIC VEINS:   -  The inferior vena cava was normal.   PERICARDIUM:   -  There was no pericardial effusion.   -  The pericardium was normal in appearance.    Conclusions: 1. Severe two-vessel coronary artery disease with chronic total occlusions of proximal/mid LCx and RCA. 2. Minimal luminal irregularities notes in LAD and large OM1 branch. 3. Normal  left ventricular systolic function (LVE 55-65%) and filling pressure (LVEDP 11 mmHg).   Recommendation: 1. Escalate statin therapy. 2. Add metoprolol  succinate for antianginal therapy and treatment of frequent supraventricular ectopy noted during catheterization and on post-cath EKG. 3. Consider hematologic workup of chronic leukocytosis. 4. If fatigue/diaphoresis continues following optimization of antianginal therapy and evaluation for other potential causes of symptoms, consultation with CTO team to discuss revascularization of RCA and/or LCx could be considered.     Neuro/Psych  Headaches PSYCHIATRIC DISORDERS Anxiety Depression     Neuromuscular disease    GI/Hepatic negative GI ROS, Neg liver ROS,,,  Endo/Other  negative endocrine ROS    Renal/GU negative Renal ROSLab Results      Component                 Value               Date                      NA                       141                 11/18/2023                K                        4.4                 11/18/2023                CO2                      28                  11/18/2023                GLUCOSE                  96                  11/18/2023                BUN                      23                  11/18/2023                CREATININE               0.71                11/18/2023                CALCIUM                   9.4                 11/18/2023                EGFR                     94                  05/15/2023  GFRNONAA                 >60                 11/18/2023                Musculoskeletal  (+) Arthritis ,    Abdominal   Peds  Hematology negative hematology ROS (+) Lab Results      Component                Value               Date                      WBC                      7.9                 11/18/2023                HGB                      13.1                11/18/2023                HCT                      39.5                11/18/2023                MCV                      89.0                11/18/2023                PLT                      348                 11/18/2023              Anesthesia Other Findings   Reproductive/Obstetrics                              Anesthesia Physical Anesthesia Plan  ASA: 2  Anesthesia Plan: General   Post-op Pain Management: Ketamine  IV*   Induction: Intravenous  PONV Risk Score and Plan: 3 and Ondansetron , Dexamethasone  and Midazolam   Airway Management Planned: Oral ETT  Additional Equipment: None  Intra-op Plan:   Post-operative Plan: Extubation in OR  Informed Consent: I have reviewed the patients History and Physical, chart, labs and discussed the procedure including the risks, benefits and alternatives for the proposed anesthesia with the patient or authorized representative  who has indicated his/her understanding and acceptance.     Dental advisory given  Plan Discussed with: CRNA  Anesthesia Plan Comments: (See PAT note 11/25/23)         Anesthesia Quick Evaluation

## 2023-11-26 NOTE — Progress Notes (Signed)
 Anesthesia Chart Review   Case: [id number] Date/Time: 12/03/23 0815   Procedures:      ROBOTIC ASSISTED LAPAROSCOPIC RADICAL PROSTATECTOMY     LAPAROSCOPIC TOTAL PELVIC LYMPHADENECTOMY   Anesthesia type: General   Diagnosis: Malignant neoplasm of prostate (HCC) [C61]   Pre-op diagnosis: PROSTATE CANCER   Location: [facility] ROOM 03 / [facility]   Surgeons: Shane Steffan BROCKS, MD       DISCUSSION:68 y.o. former smoker with h/o PONV, severe two-vessel  CAD with chronic occlusions of the proximal/mid LCx and RCA, prostate cancer scheduled for above procedure 12/03/2023 with Dr. Steffan Shane.   Per cardiology preoperative evaluation 11/20/23, Mr. Coll perioperative risk of a major cardiac event is 0.9% according to the Revised Cardiac Risk Index (RCRI).  Therefore, he is at low risk for perioperative complications.   His functional capacity is good at 6.21 METs according to the Duke Activity Status Index (DASI). Recommendations: According to ACC/AHA guidelines, no further cardiovascular testing needed.  The patient may proceed to surgery at acceptable risk.   Antiplatelet and/or Anticoagulation Recommendations: Aspirin  can be held for 5 days prior to his surgery.  Please resume Aspirin  post operatively when it is felt to be safe from a bleeding standpoint.  Pt has been evaluated by hematology for chronic leukocytosis.  Last seen 11/18/23. Per notes likely reactionary to previous tobacco use, quit smoking March 2025.   VS: BP 121/78   Pulse 61   Temp 36.7 C (Oral)   Resp 16   Ht 5' 9 (1.753 m)   Wt 75 kg   SpO2 96%   BMI 24.42 kg/m   PROVIDERS: Gerome Brunet, DO is PCP   Primary Cardiologist: Shelda Bruckner, MD  LABS: Labs reviewed: Acceptable for surgery. (all labs ordered are listed, but only abnormal results are displayed)  Labs Reviewed  TYPE AND SCREEN     IMAGES:   EKG:   CV: Myocardial Perfusion 05/14/2023   Findings are consistent with ischemia and  infarction. The study is high risk.   No ST deviation was noted.   LV perfusion is abnormal. Defect 1: There is a large defect with mild reduction in uptake present in the apical to basal inferior and inferoseptal location(s) that is fixed. There is abnormal wall motion in the defect area. Consistent with infarction.   Left ventricular function is abnormal. Global function is mildly reduced. Nuclear stress EF: 44%. The left ventricular ejection fraction is moderately decreased (30-44%). End diastolic cavity size is moderately enlarged. End systolic cavity size is moderately enlarged.   Prior study not available for comparison.   Inferior infarct pattern but with global hypokinesis.  Visual and quantitative TID- this is a high risk finding and could suggest balanced ischemia.  Consider additional ischemic testing.   Past Medical History:  Diagnosis Date   Anxiety    Arthritis    Cancer (HCC)    prostate cancer   Current smoker    Degenerative cervical disc    Depression    ED (erectile dysfunction)    Elevated PSA    Headache    HLD (hyperlipidemia)    Myocardial infarction (HCC)    PONV (postoperative nausea and vomiting)    Pre-diabetes    Prediabetes    Spinal stenosis     Past Surgical History:  Procedure Laterality Date   LEFT HEART CATH AND CORONARY ANGIOGRAPHY N/A 05/18/2023   Procedure: LEFT HEART CATH AND CORONARY ANGIOGRAPHY;  Surgeon: Mady Bruckner, MD;  Location: [facility] INVASIVE [facility]  LAB;  Service: Cardiovascular;  Laterality: N/A;   MANDIBLE FRACTURE SURGERY     SPLENECTOMY     UMBILICAL HERNIA REPAIR      MEDICATIONS:  aspirin  EC 81 MG tablet   cholecalciferol (VITAMIN D3) 25 MCG (1000 UNIT) tablet   diazepam  (VALIUM ) 10 MG tablet   DULoxetine (CYMBALTA) 60 MG capsule   ezetimibe  (ZETIA ) 10 MG tablet   methocarbamol  (ROBAXIN ) 750 MG tablet   metoprolol  succinate (TOPROL  XL) 25 MG 24 hr tablet   Multiple Vitamin tablet   pantoprazole (PROTONIX) 40 MG tablet    rosuvastatin  (CRESTOR ) 20 MG tablet   sertraline (ZOLOFT) 100 MG tablet   traMADol  (ULTRAM ) 50 MG tablet   No current facility-administered medications for this encounter.     Harlene Hoots Ward, PA-C [facility] Pre-Surgical Testing [phone]

## 2023-12-02 NOTE — H&P (Incomplete)
 68 year old male previous seen by Dr. Rosalind for elevated PSA PSA in 2021 was 5.22 he has no family history of prostate cancer. PSA decreased to 4.5 most recently his PSA was 8.6 on 06/28/2023.   PMH: Prediabetes, HLD, tobacco use, depression, degenerative disc disease, no longer using tobacco. Cardiac cath recently no blood thinner other than ASA.   PSH Umbilical hernia repair, splenectomy   [facility]: 15-year prostate cancer specific survival: 86%, PF P40 3% at 5 years, 20% of 10 years. Organ confined disease 22%, extracapsular extension 74%, lymph node involvement 22%, SV invasion 18%,  AUASS: 8, QOL: 2  SHIM:25   Elevated PSA:  08/24/2023: PSA increased to 8.6, no FH of prostate Ca, uncle with bladder cancer. No GH. No FH of breast cancer. no wt loss changes in appetite. Never had biopsy.  09/30/23: PIRADS 4 R lateral midgland and L lateral midgland, 31 gram gland ,  10/26/23: GG3 high volumes, PSA 9.42, grade group 3 high-volume left side lateral wall, grade group 2 moderate to high volume right side, high volume in prostate had discussion about radiation versus surgery. WOul dnot nerve spare if pursued surgery.  12/03/23: Pt has decided to proceed with RALP today   FH of bladder cancer  08/24/23: no GH, + FH of bladder cancer, no FH of colon Ca. UA shows no blood today.     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 Surgical Pathology, Gross And Microscopic Examination For Prostate Needle - 09/30/2023 Visit Complexity (formerly  GPC1X) - 09/30/2023, 08/24/2023     GU PMH: Elevated PSA - 09/30/2023, - 08/24/2023, - 2022, - 2021 BPH w/o LUTS - 2022, - 2021    NON-GU PMH: Anxiety Arthritis Depression Gout Hypercholesterolemia Prediabetes Transient ischemic attack    FAMILY HISTORY: 1 Daughter - Daughter 2 sons - Son Bladder Cancer - Uncle, Brother Deceased - Father, Mother Heart Disease - Mother Lung Cancer - Mother, Father   SOCIAL HISTORY: Marital Status: Married Preferred Language: English; Ethnicity: Not Hispanic Or Latino; Race: White Current Smoking Status: Patient smokes. Has smoked since 01/08/1973. Smokes 1 pack per day.   Tobacco Use Assessment Completed: Used Tobacco in last 30 days? Drinks 1 drink per week.  Drinks 4+ caffeinated drinks per day.    REVIEW OF SYSTEMS:    GU Review Male:   Patient denies frequent urination, hard to postpone urination, burning/ pain with urination, get up at night to urinate, leakage of urine, stream starts and stops, trouble starting your stream, have to strain to urinate , erection problems, and penile pain.  Gastrointestinal (Upper):   Patient denies nausea, vomiting, and indigestion/ heartburn.  Gastrointestinal (Lower):   Patient denies diarrhea and constipation.  Constitutional:   Patient denies fever, night sweats, weight loss, and fatigue.  Skin:   Patient denies skin rash/ lesion and itching.  Eyes:   Patient denies blurred vision and double vision.  Ears/ Nose/ Throat:   Patient denies sore throat and sinus  problems.  Hematologic/Lymphatic:   Patient denies swollen glands and easy bruising.  Cardiovascular:   Patient denies leg swelling and chest pains.  Respiratory:   Patient denies cough and shortness of breath.  Endocrine:   Patient denies excessive thirst.  Musculoskeletal:   Patient denies back pain and joint pain.  Neurological:   Patient denies dizziness and headaches.  Psychologic:   Patient denies depression and anxiety.   VITAL SIGNS:  None   Complexity of Data:  Source Of History:  Patient  Records Review:   AUA Symptom Score, Pathology Reports  Urine Test Review:   Urinalysis  X-Ray Review: PET- PSMA Scan: Reviewed Films. Reviewed Report. Discussed With Patient. Concern for metastasis along the right external iliac. No other concerning findings.    08/24/23 07/09/20  PSA  Total PSA 9.42 ng/mL 4.50 ng/mL  Free PSA 0.83 ng/mL 0.72 ng/mL  % Free PSA 9 % PSA 16 % PSA   Notes:                     AUASS: 8.  QOL: 2  Shim 25 no signs of AD.   PROCEDURES:          Visit Complexity - G2211    ASSESSMENT:      ICD-10 Details  1 GU:   Prostate Cancer - C61 Undiagnosed New Problem     PLAN:           Schedule Return Visit/Planned Activity: 1 Month - Office Visit          Document Letter(s):  Created for Patient: Clinical Summary         Notes:   Prostate cancer: Patient diagnosed with unfavorable intermediate risk prostate cancer with possible metastasis to the right iliac node location. We discussed that treatment would be the best option.   I explained that surgery and radiation are equivalent in terms of treatment. But that in his situation with a high volume of prostate cancer as well as possible spread to the iliac node there is a decent chance and after he had surgery he would need adjuvant radiation.   We discussed the risk benefits and alternatives to radiation including worsening lower urinary symptoms, worsening rectal symptoms, risk of stricture risk of gross hematuria risk of secondary malignancy need for ER, need for ADT as well as progression of symptoms ED and incontinence.   We discussed the risk of his alternatives to surgery including bleed infection damage around structures possible injury to the bowel possible masslike leak possible lymphatic leak incontinence as well as ED. I explained him that for surgery I would not be able to perform a nerve spare.   Explained to the he patient that if he  had surgery and there is a decent chance he would need adjuvant radiation which could potentially worsen erectile function as well as continence.   Patient has decided to proceed with RALP despite knowing the risk of needing XRT post operatively  Ucx negative.    Cardiology preoperative risk assessment is 0.9% risk by cardiology.

## 2023-12-03 ENCOUNTER — Other Ambulatory Visit

## 2023-12-03 ENCOUNTER — Other Ambulatory Visit: Payer: Self-pay

## 2023-12-03 ENCOUNTER — Ambulatory Visit (HOSPITAL_COMMUNITY): Payer: Self-pay | Admitting: Physician Assistant

## 2023-12-03 ENCOUNTER — Observation Stay (HOSPITAL_COMMUNITY)
Admission: RE | Admit: 2023-12-03 | Discharge: 2023-12-05 | Disposition: A | Discharge status: Home / Self Care | Source: Ambulatory Visit | Attending: Urology | Admitting: Urology

## 2023-12-03 ENCOUNTER — Ambulatory Visit (HOSPITAL_COMMUNITY): Payer: Self-pay | Admitting: Anesthesiology

## 2023-12-03 ENCOUNTER — Ambulatory Visit: Admitting: Oncology

## 2023-12-03 ENCOUNTER — Encounter (HOSPITAL_COMMUNITY)
Admission: RE | Disposition: A | Discharge status: Home / Self Care | Payer: Self-pay | Source: Ambulatory Visit | Attending: Urology

## 2023-12-03 ENCOUNTER — Encounter (HOSPITAL_COMMUNITY): Payer: Self-pay | Admitting: Urology

## 2023-12-03 DIAGNOSIS — C61 Malignant neoplasm of prostate: Secondary | ICD-10-CM

## 2023-12-03 DIAGNOSIS — Z87891 Personal history of nicotine dependence: Secondary | ICD-10-CM | POA: Insufficient documentation

## 2023-12-03 DIAGNOSIS — I2510 Atherosclerotic heart disease of native coronary artery without angina pectoris: Secondary | ICD-10-CM | POA: Insufficient documentation

## 2023-12-03 DIAGNOSIS — Z01818 Encounter for other preprocedural examination: Secondary | ICD-10-CM

## 2023-12-03 DIAGNOSIS — K660 Peritoneal adhesions (postprocedural) (postinfection): Secondary | ICD-10-CM | POA: Diagnosis not present

## 2023-12-03 DIAGNOSIS — Z7982 Long term (current) use of aspirin: Secondary | ICD-10-CM | POA: Diagnosis not present

## 2023-12-03 DIAGNOSIS — I890 Lymphedema, not elsewhere classified: Secondary | ICD-10-CM | POA: Diagnosis not present

## 2023-12-03 HISTORY — PX: LAPAROSCOPIC TOTAL PELVIC LYMPHADENECTOMY: SHX7605

## 2023-12-03 HISTORY — PX: ROBOT ASSISTED LAPAROSCOPIC RADICAL PROSTATECTOMY: SHX5141

## 2023-12-03 LAB — CBC
HCT: 38.8 % — ABNORMAL LOW (ref 39.0–52.0)
Hemoglobin: 11.9 g/dL — ABNORMAL LOW (ref 13.0–17.0)
MCH: 29.2 pg (ref 26.0–34.0)
MCHC: 30.7 g/dL (ref 30.0–36.0)
MCV: 95.1 fL (ref 80.0–100.0)
Platelets: 411 K/uL — ABNORMAL HIGH (ref 150–400)
RBC: 4.08 MIL/uL — ABNORMAL LOW (ref 4.22–5.81)
RDW: 15.3 % (ref 11.5–15.5)
WBC: 22.9 K/uL — ABNORMAL HIGH (ref 4.0–10.5)
nRBC: 0 % (ref 0.0–0.2)

## 2023-12-03 LAB — BASIC METABOLIC PANEL WITH GFR
Anion gap: 11 (ref 5–15)
BUN: 17 mg/dL (ref 8–23)
CO2: 22 mmol/L (ref 22–32)
Calcium: 8.7 mg/dL — ABNORMAL LOW (ref 8.9–10.3)
Chloride: 106 mmol/L (ref 98–111)
Creatinine, Ser: 0.93 mg/dL (ref 0.61–1.24)
GFR, Estimated: >60 mL/min (ref >60)
Glucose, Bld: 184 mg/dL — ABNORMAL HIGH (ref 70–99)
Potassium: 4.3 mmol/L (ref 3.5–5.1)
Sodium: 140 mmol/L (ref 135–145)

## 2023-12-03 LAB — ABO/RH: ABO/RH(D): A POS

## 2023-12-03 LAB — PREPARE RBC (CROSSMATCH)

## 2023-12-03 SURGERY — ROBOTIC ASSISTED LAPAROSCOPIC RADICAL PROSTATECTOMY
Anesthesia: General

## 2023-12-03 MED ORDER — DEXAMETHASONE SODIUM PHOSPHATE 10 MG/ML IJ SOLN
INTRAMUSCULAR | Status: AC
Start: 2023-12-03 — End: 2023-12-03
  Filled 2023-12-03: qty 1

## 2023-12-03 MED ORDER — CEFAZOLIN SODIUM 1 G IJ SOLR
INTRAMUSCULAR | Status: AC
  Filled 2023-12-03: qty 20

## 2023-12-03 MED ORDER — SERTRALINE HCL 100 MG PO TABS
200.0000 mg | ORAL_TABLET | Freq: Every day | ORAL | Status: DC
  Administered 2023-12-03 – 2023-12-04 (×2): 200 mg via ORAL
  Filled 2023-12-03 (×2): qty 2

## 2023-12-03 MED ORDER — FENTANYL CITRATE (PF) 100 MCG/2ML IJ SOLN
INTRAMUSCULAR | Status: DC | PRN
  Administered 2023-12-03: 100 ug via INTRAVENOUS

## 2023-12-03 MED ORDER — STERILE WATER FOR IRRIGATION IR SOLN
Status: DC | PRN
  Administered 2023-12-03: 1000 mL

## 2023-12-03 MED ORDER — GLYCOPYRROLATE 0.2 MG/ML IJ SOLN
INTRAMUSCULAR | Status: DC | PRN
  Administered 2023-12-03: .2 mg via INTRAVENOUS

## 2023-12-03 MED ORDER — SODIUM CHLORIDE 0.9 % IV BOLUS
1000.0000 mL | Freq: Once | INTRAVENOUS | Status: AC
Start: 2023-12-03 — End: 2023-12-03
  Administered 2023-12-03: 1000 mL via INTRAVENOUS

## 2023-12-03 MED ORDER — BUPIVACAINE HCL (PF) 0.25 % IJ SOLN
INTRAMUSCULAR | Status: AC
  Filled 2023-12-03: qty 30

## 2023-12-03 MED ORDER — HYDRALAZINE HCL 20 MG/ML IJ SOLN
INTRAMUSCULAR | Status: DC | PRN
  Administered 2023-12-03: 10 mg via INTRAVENOUS

## 2023-12-03 MED ORDER — OXYCODONE HCL 5 MG/5ML PO SOLN: 5.0000 mg | Freq: Once | ORAL | Status: DC | PRN

## 2023-12-03 MED ORDER — SULFAMETHOXAZOLE-TRIMETHOPRIM 800-160 MG PO TABS: 1.0000 | ORAL_TABLET | Freq: Two times a day (BID) | ORAL | 0 refills | Status: AC

## 2023-12-03 MED ORDER — HEMOSTATIC AGENTS (NO CHARGE) OPTIME
TOPICAL | Status: DC | PRN
  Administered 2023-12-03: 1 via TOPICAL

## 2023-12-03 MED ORDER — PHENYLEPHRINE HCL (PRESSORS) 10 MG/ML IV SOLN
INTRAVENOUS | Status: DC | PRN
  Administered 2023-12-03: 100 ug via INTRAVENOUS
  Administered 2023-12-03 (×2): 160 ug via INTRAVENOUS

## 2023-12-03 MED ORDER — SURGIFLO WITH THROMBIN (HEMOSTATIC MATRIX KIT) OPTIME
TOPICAL | Status: DC | PRN
  Administered 2023-12-03: 1 via TOPICAL

## 2023-12-03 MED ORDER — FENTANYL CITRATE PF 50 MCG/ML IJ SOSY: 25.0000 ug | PREFILLED_SYRINGE | INTRAMUSCULAR | Status: DC | PRN

## 2023-12-03 MED ORDER — PHENYLEPHRINE HCL-NACL 20-0.9 MG/250ML-% IV SOLN
INTRAVENOUS | Status: DC | PRN
  Administered 2023-12-03: 50 ug/min via INTRAVENOUS

## 2023-12-03 MED ORDER — HYDRALAZINE HCL 20 MG/ML IJ SOLN
INTRAMUSCULAR | Status: AC
  Filled 2023-12-03: qty 1

## 2023-12-03 MED ORDER — DIPHENHYDRAMINE HCL 50 MG/ML IJ SOLN: 12.5000 mg | Freq: Four times a day (QID) | INTRAMUSCULAR | Status: DC | PRN

## 2023-12-03 MED ORDER — BUPIVACAINE LIPOSOME 1.3 % IJ SUSP
INTRAMUSCULAR | Status: DC | PRN
  Administered 2023-12-03: 20 mL

## 2023-12-03 MED ORDER — PROPOFOL 10 MG/ML IV BOLUS
INTRAVENOUS | Status: DC | PRN
  Administered 2023-12-03: 180 mg via INTRAVENOUS

## 2023-12-03 MED ORDER — PROPOFOL 10 MG/ML IV BOLUS
INTRAVENOUS | Status: AC
  Filled 2023-12-03: qty 20

## 2023-12-03 MED ORDER — DIPHENHYDRAMINE HCL 12.5 MG/5ML PO ELIX: 12.5000 mg | ORAL_SOLUTION | Freq: Four times a day (QID) | ORAL | Status: DC | PRN

## 2023-12-03 MED ORDER — HYDROMORPHONE HCL 2 MG/ML IJ SOLN
INTRAMUSCULAR | Status: AC
  Filled 2023-12-03: qty 1

## 2023-12-03 MED ORDER — LABETALOL HCL 5 MG/ML IV SOLN
INTRAVENOUS | Status: DC | PRN
  Administered 2023-12-03: 10 mg via INTRAVENOUS
  Administered 2023-12-03: 5 mg via INTRAVENOUS

## 2023-12-03 MED ORDER — POLYETHYLENE GLYCOL 3350 17 GM/SCOOP PO POWD: 17.0000 g | Freq: Every day | ORAL | 0 refills | Status: AC

## 2023-12-03 MED ORDER — ALBUMIN HUMAN 5 % IV SOLN: INTRAVENOUS | Status: DC | PRN

## 2023-12-03 MED ORDER — SENNOSIDES-DOCUSATE SODIUM 8.6-50 MG PO TABS
2.0000 | ORAL_TABLET | Freq: Every day | ORAL | Status: DC
  Administered 2023-12-03 – 2023-12-04 (×2): 2 via ORAL
  Filled 2023-12-03 (×2): qty 2

## 2023-12-03 MED ORDER — BUPIVACAINE HCL (PF) 0.25 % IJ SOLN
INTRAMUSCULAR | Status: DC | PRN
  Administered 2023-12-03: 10 mL

## 2023-12-03 MED ORDER — LIDOCAINE HCL (PF) 2 % IJ SOLN
INTRAMUSCULAR | Status: DC | PRN
  Administered 2023-12-03: 1.5 mg/kg/h via INTRADERMAL

## 2023-12-03 MED ORDER — GLYCOPYRROLATE 0.2 MG/ML IJ SOLN
INTRAMUSCULAR | Status: AC
  Filled 2023-12-03: qty 1

## 2023-12-03 MED ORDER — LACTATED RINGERS IV SOLN
INTRAVENOUS | Status: DC
Start: 2023-12-03 — End: 2023-12-03

## 2023-12-03 MED ORDER — ACETAMINOPHEN 10 MG/ML IV SOLN
INTRAVENOUS | Status: DC | PRN
  Administered 2023-12-03: 1000 mg via INTRAVENOUS

## 2023-12-03 MED ORDER — LACTATED RINGERS IR SOLN
Status: DC | PRN
  Administered 2023-12-03: 1000 mL

## 2023-12-03 MED ORDER — DEXAMETHASONE SODIUM PHOSPHATE 4 MG/ML IJ SOLN
INTRAMUSCULAR | Status: DC | PRN
Start: 2023-12-03 — End: 2023-12-03
  Administered 2023-12-03: 8 mg via INTRAVENOUS

## 2023-12-03 MED ORDER — ROCURONIUM BROMIDE 100 MG/10ML IV SOLN
INTRAVENOUS | Status: DC | PRN
  Administered 2023-12-03 (×3): 20 mg via INTRAVENOUS
  Administered 2023-12-03: 10 mg via INTRAVENOUS
  Administered 2023-12-03: 70 mg via INTRAVENOUS

## 2023-12-03 MED ORDER — LABETALOL HCL 5 MG/ML IV SOLN
INTRAVENOUS | Status: AC
Start: 2023-12-03 — End: 2023-12-03
  Filled 2023-12-03: qty 4

## 2023-12-03 MED ORDER — HYDROMORPHONE HCL 1 MG/ML IJ SOLN
INTRAMUSCULAR | Status: DC | PRN
  Administered 2023-12-03 (×2): 1 mg via INTRAVENOUS

## 2023-12-03 MED ORDER — ACETAMINOPHEN 10 MG/ML IV SOLN: 1000.0000 mg | Freq: Once | INTRAVENOUS | Status: DC | PRN

## 2023-12-03 MED ORDER — EZETIMIBE 10 MG PO TABS
10.0000 mg | ORAL_TABLET | Freq: Every day | ORAL | Status: DC
  Administered 2023-12-03 – 2023-12-05 (×3): 10 mg via ORAL
  Filled 2023-12-03 (×3): qty 1

## 2023-12-03 MED ORDER — ROSUVASTATIN CALCIUM 20 MG PO TABS
20.0000 mg | ORAL_TABLET | Freq: Every day | ORAL | Status: DC
  Administered 2023-12-03 – 2023-12-04 (×2): 20 mg via ORAL
  Filled 2023-12-03 (×2): qty 1

## 2023-12-03 MED ORDER — METHOCARBAMOL 500 MG PO TABS
1000.0000 mg | ORAL_TABLET | Freq: Four times a day (QID) | ORAL | Status: DC
Start: 2023-12-03 — End: 2023-12-05
  Administered 2023-12-03 – 2023-12-05 (×7): 1000 mg via ORAL
  Filled 2023-12-03 (×7): qty 2

## 2023-12-03 MED ORDER — HEPARIN SODIUM (PORCINE) 5000 UNIT/ML IJ SOLN
5000.0000 [IU] | Freq: Once | INTRAMUSCULAR | Status: AC
Start: 2023-12-03 — End: 2023-12-03
  Administered 2023-12-03: 5000 [IU] via SUBCUTANEOUS
  Filled 2023-12-03: qty 1

## 2023-12-03 MED ORDER — ROCURONIUM BROMIDE 10 MG/ML (PF) SYRINGE
PREFILLED_SYRINGE | INTRAVENOUS | Status: AC
  Filled 2023-12-03: qty 20

## 2023-12-03 MED ORDER — CEFAZOLIN SODIUM-DEXTROSE 1-4 GM/50ML-% IV SOLN
1.0000 g | Freq: Three times a day (TID) | INTRAVENOUS | Status: AC
  Administered 2023-12-03 – 2023-12-04 (×2): 1 g via INTRAVENOUS
  Filled 2023-12-03 (×2): qty 50

## 2023-12-03 MED ORDER — CHLORHEXIDINE GLUCONATE 0.12 % MT SOLN
15.0000 mL | Freq: Once | OROMUCOSAL | Status: DC
Start: 2023-12-03 — End: 2023-12-03

## 2023-12-03 MED ORDER — ACETAMINOPHEN 500 MG PO TABS
1000.0000 mg | ORAL_TABLET | Freq: Four times a day (QID) | ORAL | Status: AC
  Administered 2023-12-03 – 2023-12-04 (×4): 1000 mg via ORAL
  Filled 2023-12-03 (×4): qty 2

## 2023-12-03 MED ORDER — BUPIVACAINE LIPOSOME 1.3 % IJ SUSP
INTRAMUSCULAR | Status: AC
  Filled 2023-12-03: qty 20

## 2023-12-03 MED ORDER — HYDROCODONE-ACETAMINOPHEN 5-325 MG PO TABS: 1.0000 | ORAL_TABLET | Freq: Four times a day (QID) | ORAL | 0 refills | Status: AC | PRN

## 2023-12-03 MED ORDER — ORAL CARE MOUTH RINSE: 15.0000 mL | Freq: Once | OROMUCOSAL | Status: DC

## 2023-12-03 MED ORDER — HYDROMORPHONE HCL 1 MG/ML IJ SOLN
0.5000 mg | INTRAMUSCULAR | Status: DC | PRN
  Administered 2023-12-03 – 2023-12-05 (×8): 1 mg via INTRAVENOUS
  Filled 2023-12-03 (×8): qty 1

## 2023-12-03 MED ORDER — ALBUMIN HUMAN 5 % IV SOLN
INTRAVENOUS | Status: AC
  Filled 2023-12-03: qty 250

## 2023-12-03 MED ORDER — OXYCODONE HCL 5 MG PO TABS
5.0000 mg | ORAL_TABLET | ORAL | Status: DC | PRN
  Administered 2023-12-03 – 2023-12-05 (×3): 5 mg via ORAL
  Filled 2023-12-03 (×3): qty 1

## 2023-12-03 MED ORDER — LIDOCAINE HCL (PF) 2 % IJ SOLN
INTRAMUSCULAR | Status: AC
  Filled 2023-12-03: qty 20

## 2023-12-03 MED ORDER — DULOXETINE HCL 60 MG PO CPEP
60.0000 mg | ORAL_CAPSULE | Freq: Every evening | ORAL | Status: DC
  Administered 2023-12-03 – 2023-12-04 (×2): 60 mg via ORAL
  Filled 2023-12-03 (×2): qty 1

## 2023-12-03 MED ORDER — SUGAMMADEX SODIUM 200 MG/2ML IV SOLN
INTRAVENOUS | Status: AC
Start: 2023-12-03 — End: 2023-12-03
  Filled 2023-12-03: qty 2

## 2023-12-03 MED ORDER — FENTANYL CITRATE (PF) 100 MCG/2ML IJ SOLN
INTRAMUSCULAR | Status: AC
  Filled 2023-12-03: qty 2

## 2023-12-03 MED ORDER — KETAMINE HCL 50 MG/5ML IJ SOSY
PREFILLED_SYRINGE | INTRAMUSCULAR | Status: DC | PRN
Start: 2023-12-03 — End: 2023-12-03
  Administered 2023-12-03: 50 mg via INTRAVENOUS

## 2023-12-03 MED ORDER — KETAMINE HCL 50 MG/5ML IJ SOSY
PREFILLED_SYRINGE | INTRAMUSCULAR | Status: AC
  Filled 2023-12-03: qty 5

## 2023-12-03 MED ORDER — MAGNESIUM CITRATE PO SOLN: 1.0000 | Freq: Once | ORAL | Status: DC

## 2023-12-03 MED ORDER — POLYETHYLENE GLYCOL 3350 17 G PO PACK
17.0000 g | PACK | Freq: Every day | ORAL | Status: DC
Start: 2023-12-03 — End: 2023-12-05
  Administered 2023-12-03 – 2023-12-05 (×3): 17 g via ORAL
  Filled 2023-12-03 (×2): qty 1

## 2023-12-03 MED ORDER — ONDANSETRON HCL 4 MG/2ML IJ SOLN: 4.0000 mg | INTRAMUSCULAR | Status: DC | PRN

## 2023-12-03 MED ORDER — OXYCODONE HCL 5 MG PO TABS: 5.0000 mg | ORAL_TABLET | Freq: Once | ORAL | Status: DC | PRN

## 2023-12-03 MED ORDER — MELATONIN 5 MG PO TABS
5.0000 mg | ORAL_TABLET | Freq: Every day | ORAL | Status: DC
Start: 2023-12-03 — End: 2023-12-05
  Administered 2023-12-03 – 2023-12-04 (×2): 5 mg via ORAL
  Filled 2023-12-03 (×2): qty 1

## 2023-12-03 MED ORDER — CEFAZOLIN SODIUM-DEXTROSE 2-4 GM/100ML-% IV SOLN
2.0000 g | INTRAVENOUS | Status: AC
  Administered 2023-12-03 (×2): 2 g via INTRAVENOUS
  Filled 2023-12-03: qty 100

## 2023-12-03 MED ORDER — ONDANSETRON HCL 4 MG/2ML IJ SOLN
INTRAMUSCULAR | Status: DC | PRN
  Administered 2023-12-03: 4 mg via INTRAVENOUS

## 2023-12-03 MED ORDER — SUGAMMADEX SODIUM 200 MG/2ML IV SOLN
INTRAVENOUS | Status: DC | PRN
Start: 2023-12-03 — End: 2023-12-03
  Administered 2023-12-03: 200 mg via INTRAVENOUS

## 2023-12-03 MED ORDER — ALBUTEROL SULFATE HFA 108 (90 BASE) MCG/ACT IN AERS
INHALATION_SPRAY | RESPIRATORY_TRACT | Status: DC | PRN
  Administered 2023-12-03 (×2): 2 via RESPIRATORY_TRACT

## 2023-12-03 MED ORDER — METOPROLOL SUCCINATE ER 25 MG PO TB24
12.5000 mg | ORAL_TABLET | Freq: Every day | ORAL | Status: DC
  Administered 2023-12-03 – 2023-12-05 (×3): 12.5 mg via ORAL
  Filled 2023-12-03 (×3): qty 1

## 2023-12-03 MED ORDER — ONDANSETRON HCL 4 MG/2ML IJ SOLN
INTRAMUSCULAR | Status: AC
Start: 2023-12-03 — End: 2023-12-03
  Filled 2023-12-03: qty 2

## 2023-12-03 MED ORDER — PANTOPRAZOLE SODIUM 40 MG PO TBEC
40.0000 mg | DELAYED_RELEASE_TABLET | Freq: Every day | ORAL | Status: DC
  Administered 2023-12-03 – 2023-12-04 (×2): 40 mg via ORAL
  Filled 2023-12-03 (×2): qty 1

## 2023-12-03 MED ORDER — FLEET ENEMA RE ENEM: 1.0000 | ENEMA | Freq: Once | RECTAL | Status: DC

## 2023-12-03 MED ORDER — LIDOCAINE HCL (CARDIAC) PF 100 MG/5ML IV SOSY
PREFILLED_SYRINGE | INTRAVENOUS | Status: DC | PRN
  Administered 2023-12-03: 50 mg via INTRAVENOUS

## 2023-12-03 MED ORDER — ACETAMINOPHEN 10 MG/ML IV SOLN
INTRAVENOUS | Status: AC
  Filled 2023-12-03: qty 100

## 2023-12-03 MED ORDER — SODIUM CHLORIDE 0.9 % IV SOLN: INTRAVENOUS | Status: DC

## 2023-12-03 SURGICAL SUPPLY — 64 items
APPLICATOR COTTON TIP 6 STRL (MISCELLANEOUS) ×1 IMPLANT
APPLICATOR SURGIFLO ENDO (HEMOSTASIS) ×1 IMPLANT
BAG COUNTER SPONGE SURGICOUNT (BAG) IMPLANT
CATH FOLEY 2WAY SLVR 5CC 18FR (CATHETERS) ×1 IMPLANT
CATH ROBINSON RED A/P 16FR (CATHETERS) ×1 IMPLANT
CATH TIEMANN FOLEY 18FR 5CC (CATHETERS) ×1 IMPLANT
CHLORAPREP W/TINT 26 (MISCELLANEOUS) ×1 IMPLANT
CLIP LIGATING HEM O LOK PURPLE (MISCELLANEOUS) ×2 IMPLANT
COVER SURGICAL LIGHT HANDLE (MISCELLANEOUS) ×1 IMPLANT
COVER TIP SHEARS 8 DVNC (MISCELLANEOUS) ×1 IMPLANT
CUTTER ECHEON FLEX ENDO 45 340 (ENDOMECHANICALS) ×1 IMPLANT
DERMABOND ADVANCED .7 DNX12 (GAUZE/BANDAGES/DRESSINGS) ×2 IMPLANT
DRAPE ARM DVNC X/XI (DISPOSABLE) ×4 IMPLANT
DRAPE COLUMN DVNC XI (DISPOSABLE) ×1 IMPLANT
DRAPE SURG IRRIG POUCH 19X23 (DRAPES) ×1 IMPLANT
DRIVER NDL LRG 8 DVNC XI (INSTRUMENTS) ×2 IMPLANT
DRIVER NDLE LRG 8 DVNC XI (INSTRUMENTS) ×2 IMPLANT
DRSG TEGADERM 4X4.75 (GAUZE/BANDAGES/DRESSINGS) ×1 IMPLANT
ELECT PENCIL ROCKER SW 15FT (MISCELLANEOUS) ×1 IMPLANT
ELECT REM PT RETURN 15FT ADLT (MISCELLANEOUS) ×1 IMPLANT
FORCEPS BPLR LNG DVNC XI (INSTRUMENTS) ×1 IMPLANT
FORCEPS PROGRASP DVNC XI (FORCEP) ×1 IMPLANT
GAUZE 4X4 16PLY ~~LOC~~+RFID DBL (SPONGE) IMPLANT
GAUZE SPONGE 2X2 8PLY STRL LF (GAUZE/BANDAGES/DRESSINGS) IMPLANT
GAUZE SPONGE 4X4 12PLY STRL (GAUZE/BANDAGES/DRESSINGS) ×1 IMPLANT
GLOVE BIO SURGEON STRL SZ 6.5 (GLOVE) ×1 IMPLANT
GLOVE BIO SURGEON STRL SZ8 (GLOVE) ×2 IMPLANT
GLOVE BIOGEL PI IND STRL 8.5 (GLOVE) ×2 IMPLANT
GOWN STRL REUS W/ TWL XL LVL3 (GOWN DISPOSABLE) ×2 IMPLANT
GOWN STRL SURGICAL XL XLNG (GOWN DISPOSABLE) ×1 IMPLANT
HEMOSTAT SURGICEL 2X4 FIBR (HEMOSTASIS) ×1 IMPLANT
HOLDER FOLEY CATH W/STRAP (MISCELLANEOUS) ×1 IMPLANT
IRRIGATION SUCT STRKRFLW 2 WTP (MISCELLANEOUS) ×1 IMPLANT
IV LACTATED RINGERS 1000ML (IV SOLUTION) IMPLANT
KIT TURNOVER KIT A (KITS) ×1 IMPLANT
MARKER SKIN DUAL TIP RULER LAB (MISCELLANEOUS) IMPLANT
NDL INSUFFLATION 14GA 120MM (NEEDLE) ×1 IMPLANT
NEEDLE INSUFFLATION 14GA 120MM (NEEDLE) ×1 IMPLANT
PACK ROBOT UROLOGY CUSTOM (CUSTOM PROCEDURE TRAY) ×1 IMPLANT
PAD POSITIONING PINK XL (MISCELLANEOUS) ×1 IMPLANT
PLUG CATH AND CAP STRL 200 (CATHETERS) IMPLANT
PORT ACCESS TROCAR AIRSEAL 12 (TROCAR) ×1 IMPLANT
RELOAD STAPLE 45 4.1 GRN THCK (STAPLE) ×1 IMPLANT
SCISSORS LAP 5X45 EPIX DISP (ENDOMECHANICALS) IMPLANT
SCISSORS MNPLR CVD DVNC XI (INSTRUMENTS) ×1 IMPLANT
SEAL UNIV 5-12 XI (MISCELLANEOUS) ×4 IMPLANT
SET TRI-LUMEN FLTR TB AIRSEAL (TUBING) ×1 IMPLANT
SOL PREP POV-IOD 4OZ 10% (MISCELLANEOUS) ×1 IMPLANT
SOLUTION ELECTROSURG ANTI STCK (MISCELLANEOUS) ×1 IMPLANT
SPIKE FLUID TRANSFER (MISCELLANEOUS) ×1 IMPLANT
SPONGE T-LAP 4X18 ~~LOC~~+RFID (SPONGE) IMPLANT
SURGIFLO W/THROMBIN 8M KIT (HEMOSTASIS) ×1 IMPLANT
SUT ETHILON 3 0 PS 1 (SUTURE) ×1 IMPLANT
SUT MNCRL AB 4-0 PS2 18 (SUTURE) ×2 IMPLANT
SUT PDS AB 0 CT1 36 (SUTURE) ×2 IMPLANT
SUT SILK 3 0 SH 30 (SUTURE) IMPLANT
SUT VIC AB 0 CT1 27XBRD ANTBC (SUTURE) ×1 IMPLANT
SUT VIC AB 2-0 SH 27X BRD (SUTURE) ×2 IMPLANT
SUT VIC AB 3-0 SH 27XBRD (SUTURE) IMPLANT
SUT VICRYL 0 UR6 27IN ABS (SUTURE) IMPLANT
SUT VLOC 3-0 9IN GRN (SUTURE) ×1 IMPLANT
SUTURE VLOC BRB 180 ABS3/0GR12 (SUTURE) ×2 IMPLANT
SYRINGE TOOMEY IRRIG 70ML (MISCELLANEOUS) ×1 IMPLANT
WATER STERILE IRR 1000ML POUR (IV SOLUTION) ×1 IMPLANT

## 2023-12-03 NOTE — Anesthesia Procedure Notes (Signed)
 Procedure Name: Intubation Date/Time: 12/03/2023 8:26 AM  Performed by: Levander Lani BIRCH, CRNAPre-anesthesia Checklist: Patient identified, Emergency Drugs available, Suction available, Patient being monitored and Timeout performed Patient Re-evaluated:Patient Re-evaluated prior to induction Oxygen Delivery Method: Circle system utilized Preoxygenation: Pre-oxygenation with 100% oxygen Induction Type: IV induction Ventilation: Mask ventilation without difficulty Laryngoscope Size: Miller and 2 Grade View: Grade II Tube type: Oral Tube size: 7.5 mm Number of attempts: 1 Airway Equipment and Method: Stylet Placement Confirmation: ETT inserted through vocal cords under direct vision, CO2 detector and breath sounds checked- equal and bilateral Secured at: 23 cm Tube secured with: Tape Dental Injury: Teeth and Oropharynx as per pre-operative assessment

## 2023-12-03 NOTE — Anesthesia Postprocedure Evaluation (Signed)
 Anesthesia Post Note  Patient: [name]  Procedure(s) Performed: ROBOTIC ASSISTED LAPAROSCOPIC RADICAL PROSTATECTOMY LAPAROSCOPIC BILATERAL PELVIC LYMPHADENECTOMY     Patient location during evaluation: PACU Anesthesia Type: General Level of consciousness: awake and alert Pain management: pain level controlled Vital Signs Assessment: post-procedure vital signs reviewed and stable Respiratory status: spontaneous breathing, nonlabored ventilation and respiratory function stable Cardiovascular status: blood pressure returned to baseline and stable Postop Assessment: no apparent nausea or vomiting Anesthetic complications: no   No notable events documented.  Last Vitals:  Vitals:   12/03/23 1430 12/03/23 1513  BP: (!) 140/84 117/72  Pulse: 69 (!) 58  Resp: 10 15  Temp: (!) 36.4 C 36.4 C  SpO2: 94% 98%    Last Pain:  Vitals:   12/03/23 1616  TempSrc:   PainSc: 4                  Cordella Nyquist

## 2023-12-03 NOTE — Op Note (Signed)
 Oper patient is a ative Note  Preoperative diagnosis:  1.  Localized prostate cancer  Postoperative diagnosis: 1.  Localized prostate cancer  Procedure(s): 1.  Robotic assisted laparoscopic radical prostatectomy (Non nerve sparing) 2.  Robotic assisted laparoscopic bilateral pelvic lymph node dissection 3. Laparoscopic Surgical Lysis of Adhesions   Surgeon: Dalton Pea MD  Assistants:   Dalton Hammonds, PA  An assistant was required for this surgical procedure.  The duties of the assistant included but were not limited to suctioning, passing suture, camera manipulation, retraction.  This procedure would not be able to be performed without an Geophysicist/field seismologist.     Anesthesia:  General  Complications:  None  EBL:  700cc  Specimens: 1.  Prostate with seminal vesicles 2.  Periprostatic fat 3. Bilateral pelvic lymph nodes  Drains/Catheters: 1.  18 French Foley catheter  Intraoperative findings:   Unable to obtain access Veress needle.  Hasson technique used to access abdomen.  Due to significant lesions, surgery was performed access to the abdomen. Significant adhesions in the upper abdomen taking greater than 30 minutes to take down Large bleeding vessel in during posterior dissection that caused loss of 600-700cc of blood sewn closed  35 gram prostate  extracted in non nerve sparing technique.  Bilateral lymph node dissection completed, single bulky node in right node packet Water  tight anastomosis  Patient hemostatic at the end of the case   Indication:  [name] is a 68 y/o M initially presented with an elevated PSA.  Prostate biopsy showed Gleason 4+3 prostate cancer involving the Left and right side of the prostate 11 of 15 cores positive for cancer.  PSMA showed possibel posiitve in node in the Right internal iliac region. Treatment options were discussed with him at length and he chose robotic assisted laparoscopic radical prostatectomy.  He has High risk disease and the  plan is for none nerve sparing.  Bilateral pelvic lymph node dissection was planned due to his risk stratification.  Description of procedure: The indications, alternatives, benefits, and risks were discussed with the patient and informed symptoms obtained.  The patient was brought to the operating room table, positioned supine and secured to the bed with a safety strap.  All pressure points were carefully padded and pneumatic compression devices were placed on lower extremities.  After the administration of intravenous antibiotics, subcutaneous heparin  preoperatively and general endotracheal anesthesia, the patient was repositioned in the dorsal lithotomy position using well-padded Allen stirrups.  The arms were carefully tucked at the patient's side and secured with padding.  The chest was secured in place with foam padding and cloth tape and the table was positioned in approximately 30 degree Trendelenburg.  The patient's abdomen, genitalia, and upper thighs were prepped and draped in the standard sterile manner.  A time was completed, verifying the correct patient, surgical procedure and positioning prior to beginning the procedure.  An 62 French urethral catheter was inserted to drain the bladder.  A puncture incision was made at Jain's point on the right side.  Drop test was performed as well as aspiration demonstrating no blood.  The gas was attached to the Veress needle but the pressure went up immediately.  Several attempts were made on the right side to get various access into the abdomen were not successful.  All were done in the exact same fashion.  We then transition to the opposite point on the patient's left side.  We were unable to get access on the left side either.  We  then attempted to point under the right costal margin intended this several times noting to be unsuccessful.  At this point a midline incision was made over the previous incision dissection was taken down to the fascia 15 blade  used was used to open the fascia once it was grabbed.  Dissection was taken down through the peritoneum once the peritoneum was encountered Metzenbaums were used to cut the peritoneum.  Then a finger was introduced into the abdomen there is significant adhesions surrounding the entry point.  This point due to concerns that this Kinsinger last to come into the room and the case was turned over him due to concerns about access.  During his point the procedure a 12 port was placed through our [place] entry and the abdomen was inflated to 15 mmHg.  Once the abdomen was inflated a 5 mm assistant port was placed in the right upper quadrant where would initially be.  The surgery was then turned back over to the urology team.  We then placed the 12 mm assistant port on the right side as well as a robotic port that would be on the right side adjacent to the camera port.  There were significant adhesions in the midline these were taken down using laparoscopic scissors.  There was careful attention to where the bowel was located and no injuries were noted the bowel.  Once adhesions were taken down which took over 30 minutes 0 degree lenses placed to the mid part and then the robotic trocars on the left side were placed 8 mm and 8 mm..  The 0 degree camera was then passed under direct visualization.  The abdominal cavity again was examined for any sign of injury,  and identification of anatomic landmarks.  There was a small location of the colon concerning for possible irritation to a diverticulum.  General surgery was called to evaluate.  After evaluation by Dr. Sheldon he recommended failure shoe and some imbricating sutures as well as patching some omentum over the area but otherwise felt that it was a very low likelihood of injury.   Final acute action of all trocars included a 12 mm at the midline above the umbilicus robotic ports adjacent to the midline trocar 9 cm apart from this one 8 mm robotic trocar on the left side 8  cm away from the 1 adjacent to it and the assistant port on the right side.  Placement of instruments included monopolar scissors in the right hand, a fenestrated bipolar in the left hand and a prograsp in the fourth arm.  Few more adhesions from the colon to the medial umbilical ligament on the left side were taken down.  The sigmoid colon was then released from its lateral attachments setting to be retracted cranially.  Once this dissection was completed.  A 5 cm incision was made 1 cm above the peritoneal reflection dissection was taken through the tissue until did not manage was encountered.  The vas deferens was identified in the right dissected completely and freed around the tip of the central vesicle.  The vas was then ligated and the single vessel was dissected out.  The same exact dissection was taken down the left side as well upon dissection was taken through an obvious towards the prostatic apex.  The urachus and median umbilical ligament was divided and we developed the space of Retzius down to the pubic bone.  I divided the parietal peritoneum laterally up to the vas deferens on each side.  Using the prograsp forcep to provide cranial traction on the urachus, the prostate was then defatted above the prostatic vesicle junction, and the superficial dorsal venous complex was coagulated with bipolar and divided.  We submitted the periprostatic fat for pathological specimen.  The endopelvic fascia was sharply opened bilaterally and the levator muscle fibers were swept posterior laterally allowing for visualization of the deep dorsal venous complex and apex of the prostate.  The puboprostatic ligaments were sharply divided and care was taken to preserve the dorsal venous complex.  The dorsal venous complex was then stapled using a 45 mm endovascular stapler.   0 degree lens was kept in place. I identified the bladder neck by pulling a Foley catheter.  The fourth arm was applying cranial traction on  the urachus.  I divided the anterior bladder neck musculature until I found the anterior bladder neck mucosa which was then incised. I identified the Foley catheter within, the balloon was deflated, we pulled the Foley catheter out into the operating field.  The assistant then used a grasper to apply traction to the Foley catheter and the surgical tech then placed a [place]ico on the catheter near the penis for optimal retraction.  I then divided the lateral bladder neck mucosa in the posterior bladder neck mucosa.   I was well away from the bilateral ureteral orifices.  I divided the posterior bladder neck musculature until I discovered the longitudinal fibers and kept dissecting until I found the vas deferens.    I then switched back to a 0 degree lens.  I divided the Denonvilliers fascia beneath the prostate and developed the prostate off the rectum.  During this process a large vessel was encountered that caused significant bleeding losing close to 500 to 600 cc of blood.  Several attempts were made to cauterize this vessel but were unsuccessful eventually 3-0 Vicryl was used in a figure-of-eight fashion to close the vessel.  After inspection this area was hemostatic.  This plane was bluntly developed towards the apex of the prostate.  I then addressed the pedicles, first starting with the right and then moving towards the left. I then isolated the pedicles of the prostate and used the bipolar cautery to burn the pedicles and then divided the pedicles with cold scissors.  I continued to divide the pedicles of the prostate out to the apex of the prostate.  At this point the prostate was essentially freed up except for the urethra.   .  The anterior urethra was then sharply divided with cold scissors.  The Foley catheter was then pulled back and we divided the posterior urethral wall. Patent venous sinuses were then oversewn with a 4-0 Vicryl suture.  The specimen was then placed in a Endo Catch bag and then the  bag was placed in the upper abdomen out of the way.  I then irrigated the pelvis.  We performed a rectal test by instilling air into the rectal Foley.  The test was negative.  I also did a finger swab felt no irregularities within the rectum and the finger was not visible IntraOp.  There was no concern for rectal injury.  I then over sewed a few bleeders alongside the pedicles with a 4-0 Vicryl suture on an RB1 needle.  I then performed a bilateral pelvic lymph node dissection by incising the fascia overlying the right external iliac vein, dissecting distally.  I went just distal to the node of [place] were replaced clips and then divided the lymphatics.  The lateral aspect of the dissection was the pelvic sidewall, inferior was the obturator nerve and proximal of the hypogastric vessels.  I placed clips at the proximal aspect and then divided the lymphatics.  The specimen was removed with the scope grasper and sent to pathology.  Grossly, there were no enlarged lymph nodes.  This was performed on both the right and left pelvic lymph nodes.  With good hemostasis confirmed, I then did the posterior reconstruction with a Rocco stitch.  I used a 3-0 Vloc double-armed suture on an RB1 needle.  I passed the sutures through the retrotrigonal fascia beneath the bladder on the right side and then to the rectourethralis 4 throws were done on each side, I ran this from right to left.  And then took the other end of the suture passing just proximal to the posterior bladder neck in the midline into the posterior serrated sphincter and around this from right to left using 3 throws and reapproximated the sutures.  I then completed the urethrovesical anastomosis using a 3-0 Vloc suture on an RB1 needle.  I passed both ends of the suture from the outside in through the bladder neck at the 6 o'clock position.  I passed both through the urethral stump from the inside out and the corresponding position.  I reapproximated the  bladder neck to the urethra.  I then ran the left suture on the left side anastomosis to the 9 o'clock position.  And then went back to the right-sided suture around that up the right side of the 12 o'clock position.  I then continued the left suture to the 12 o'clock position. I identified the ureteral orifices and ensured that these were not incorporated with the sutures.  I then placed a new 18 French Foley catheter into the bladder and filled it with 10 cc of sterile water .  I then secured the knot and then passed the suture behind the pubic bone for anterior suspension.  The bladder was irrigated with 200 cc of water .  There was no leak.  Hemostasis was excellent.   Attention was then turned to the area of the colon that we had addressed in the beginning of the case using silk sutures the area was imbricated.  Then a omental patch was placed over the area as well using silk sutures.   A 19 [place] Blake drain was placed through the previously placed fourth arm.  We did place a Carter-Thomason 0 vicryl suture through the 12 mm assistant port. The robot was undocked and all the trochars were removed under direct vision.    I enlarged the umbilical trocar site large enough to remove the prostate and closed the fascia with 0 PDS sutures in figure-of-eight interrupted fashion.  All the port sites were irrigated.  Exparel  was injected to the trocar sites.  The skin was closed with 4-0 Monocryl in a running subcuticular fashion.  Skin glue was applied.  At this point, the patient was extubated and awakened in the operating room and taken to recovery room in stable condition.  There were no immediate complications.  All counts were correct.  Plan: Admit for observation overnight.  Clear liquids tonight.  Regular for breakfast tomorrow.  Anticipate discharge home tomorrow.  [name] [facility]

## 2023-12-03 NOTE — Progress Notes (Signed)
 Report received from previous shift RN, agree with documented assessment. Patient visiting with family at bedside.

## 2023-12-03 NOTE — Progress Notes (Signed)
 Night of surgery note  Subjective: The patient is doing well.  No complaints.  Objective: Vital signs in last 24 hours: Temp:  [97.4 F (36.3 C)-97.6 F (36.4 C)] 97.6 F (36.4 C) (09/25 1513) Pulse Rate:  [58-70] 58 (09/25 1513) Resp:  [10-16] 15 (09/25 1513) BP: (117-160)/(70-99) 117/72 (09/25 1513) SpO2:  [93 %-100 %] 98 % (09/25 1513) Weight:  [75 kg] 75 kg (09/25 0655)  Intake/Output this shift: Total I/O In: 3550 [I.V.:2000; IV Piggyback:1550] Out: 780 [Drains:30; Blood:750]  Physical Exam:  General: Alert and oriented. Abdomen: Soft, Nondistended. Incision: Clean with dry, intact dressing. GU: catheter draining clear yellow urine   Lab Results: Recent Labs    12/03/23 1352  HGB 11.9*  HCT 38.8*    Assessment/Plan: 1) Continue to monitor 2) Per orders  Steffan Pea MD.   Steffan JAYSON Pea 12/03/2023, 5:20 PM

## 2023-12-03 NOTE — Discharge Instructions (Signed)

## 2023-12-03 NOTE — Transfer of Care (Signed)
 Immediate Anesthesia Transfer of Care Note  Patient: [name]  Procedure(s) Performed: ROBOTIC ASSISTED LAPAROSCOPIC RADICAL PROSTATECTOMY LAPAROSCOPIC BILATERAL PELVIC LYMPHADENECTOMY  Patient Location: PACU  Anesthesia Type:General  Level of Consciousness: awake and alert   Airway & Oxygen Therapy: Patient Spontanous Breathing and Patient connected to face mask oxygen  Post-op Assessment: Report given to RN and Post -op Vital signs reviewed and stable  Post vital signs: Reviewed and stable  Last Vitals:  Vitals Value Taken Time  BP 160/99 12/03/23 13:36  Temp    Pulse 65 12/03/23 13:41  Resp 10 12/03/23 13:41  SpO2 95 % 12/03/23 13:41  Vitals shown include unfiled device data.  Last Pain:  Vitals:   12/03/23 0655  TempSrc: Oral  PainSc: 0-No pain         Complications: No notable events documented.

## 2023-12-03 NOTE — Op Note (Signed)
 Preoperative diagnosis: prostate cancer  Postoperative diagnosis: same   Procedure: intraoperative consult, diagnostic laparoscopy  Surgeon: Herlene Bureau, M.D.  Anesthesia: GETA  Description of procedure: I was called to the operating room for assistance in safely getting into the peritoneal cavity. Dr. Shane had made a supraumbilical incision. I bluntly palpated the underside of the abdominal wall and bluntly dissection omentum off the abdominal wall. Next, I placed a 12 mm blunt tipped trocar into the space and began low flow pneumoperitoneum. Opening pressure was low and distension was appropriate. Next, I made a transverse right subcostal incision. A 5mm trocar was used to gain access to the peritoneal cavity by optical entry technique. The laparoscope was reinserted to confirm position.  On evaluation of the abdomen, there were central adhesions of omentum to the abdominal wall. The lateral and inferior areas of the abdominal wall were clear. Dr. Shane felt confident in being able to take down the central adhesions. Please see his note for all other details.  Findings: central adhesions of omentum to the abdominal wall  Specimen: none  Implant: none   Blood loss: 5 ml  Local anesthesia: none  Complications: none  Herlene Bureau, M.D. General, Bariatric, & Minimally Invasive Surgery [facility]

## 2023-12-04 ENCOUNTER — Encounter (HOSPITAL_COMMUNITY): Payer: Self-pay | Admitting: Urology

## 2023-12-04 DIAGNOSIS — C61 Malignant neoplasm of prostate: Secondary | ICD-10-CM | POA: Diagnosis not present

## 2023-12-04 LAB — CBC
HCT: 32.6 % — ABNORMAL LOW (ref 39.0–52.0)
Hemoglobin: 10.2 g/dL — ABNORMAL LOW (ref 13.0–17.0)
MCH: 29.1 pg (ref 26.0–34.0)
MCHC: 31.3 g/dL (ref 30.0–36.0)
MCV: 92.9 fL (ref 80.0–100.0)
Platelets: 373 K/uL (ref 150–400)
RBC: 3.51 MIL/uL — ABNORMAL LOW (ref 4.22–5.81)
RDW: 15.7 % — ABNORMAL HIGH (ref 11.5–15.5)
WBC: 18.8 K/uL — ABNORMAL HIGH (ref 4.0–10.5)
nRBC: 0 % (ref 0.0–0.2)

## 2023-12-04 LAB — CREATININE, FLUID (PLEURAL, PERITONEAL, JP DRAINAGE): Creat, Fluid: 0.8 mg/dL

## 2023-12-04 LAB — BASIC METABOLIC PANEL WITH GFR
Anion gap: 9 (ref 5–15)
BUN: 17 mg/dL (ref 8–23)
CO2: 23 mmol/L (ref 22–32)
Calcium: 8.5 mg/dL — ABNORMAL LOW (ref 8.9–10.3)
Chloride: 109 mmol/L (ref 98–111)
Creatinine, Ser: 0.77 mg/dL (ref 0.61–1.24)
GFR, Estimated: >60 mL/min (ref >60)
Glucose, Bld: 103 mg/dL — ABNORMAL HIGH (ref 70–99)
Potassium: 4.7 mmol/L (ref 3.5–5.1)
Sodium: 141 mmol/L (ref 135–145)

## 2023-12-04 LAB — HEMOGLOBIN AND HEMATOCRIT, BLOOD
HCT: 29.2 % — ABNORMAL LOW (ref 39.0–52.0)
Hemoglobin: 9.5 g/dL — ABNORMAL LOW (ref 13.0–17.0)

## 2023-12-04 NOTE — TOC Initial Note (Signed)
 [facility]) - Initial/Assessment Note    Patient Details  Name: [name] MRN: [id number] Date of Birth: Jul 20, 1955  [facility]) CM/SW Contact:    Bascom Service, RN Phone Number: 12/04/2023, 3:41 PM  Clinical Narrative:d/c plan home.                   Expected Discharge Plan: Home/Self Care Barriers to Discharge: Continued Medical Work up   Patient Goals and CMS Choice            Expected Discharge Plan and Services                                              Prior Living Arrangements/Services                       Activities of Daily Living   ADL Screening (condition at time of admission) Independently performs ADLs?: Yes (appropriate for developmental age) Is the patient deaf or have difficulty hearing?: No Does the patient have difficulty seeing, even when wearing glasses/contacts?: No Does the patient have difficulty concentrating, remembering, or making decisions?: No  Permission Sought/Granted                  Emotional Assessment              Admission diagnosis:  Malignant neoplasm of prostate (HCC) [C61] Prostate cancer (HCC) [C61] Patient Active Problem List   Diagnosis Date Noted   Prostate cancer (HCC) 09/03/2023   Leukocytosis 06/05/2023   Night sweats 06/05/2023   Quit smoking in March 2025 06/05/2023   Spinal stenosis of lumbar region 08/27/2020   Impingement syndrome of right shoulder 12/24/2016   Chronic right shoulder pain 11/26/2016   Neck pain 11/04/2016   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 [place] [address] Phone: [phone] Fax: [phone]     Social Drivers of Health (SDOH) Social History: SDOH Screenings   Food Insecurity: No Food Insecurity (12/03/2023)  Housing: Low Risk  (12/03/2023)   Transportation Needs: No Transportation Needs (12/03/2023)  Utilities: Not At Risk (12/03/2023)  Alcohol Screen: Low Risk  (04/08/2023)  Depression (PHQ2-9): Medium Risk (11/18/2023)  Physical Activity: Unknown (04/08/2023)  Social Connections: Moderately Integrated (12/03/2023)  Tobacco Use: Medium Risk (12/03/2023)   SDOH Interventions:     Readmission Risk Interventions     No data to display

## 2023-12-04 NOTE — Plan of Care (Incomplete)
  Problem: Bowel/Gastric: Goal: Gastrointestinal status for postoperative course will improve Outcome: Progressing   Problem: Pain Management: Goal: General experience of comfort will improve Outcome: Progressing   Problem: Skin Integrity: Goal: Demonstration of wound healing without infection will improve Outcome: Progressing   Problem: Urinary Elimination: Goal: Ability to avoid or minimize complications of infection will improve Outcome: Progressing Goal: Ability to achieve and maintain urine output will improve Outcome: Progressing Goal: Home care management will improve Outcome: Progressing   Problem: Clinical Measurements: Goal: Diagnostic test results will improve Outcome: Progressing   Problem: Activity: Goal: Risk for activity intolerance will decrease Outcome: Progressing   Problem: Elimination: Goal: Will not experience complications related to bowel motility Outcome: Progressing Goal: Will not experience complications related to urinary retention Outcome: Progressing   Problem: Pain Managment: Goal: General experience of comfort will improve and/or be controlled Outcome: Progressing   Problem: Skin Integrity: Goal: Risk for impaired skin integrity will decrease Outcome: Progressing   Problem: Education: Goal: Knowledge of the procedure and recovery process will improve Outcome: Adequate for Discharge   Problem: Clinical Measurements: Goal: Will remain free from infection Outcome: Adequate for Discharge Goal: Respiratory complications will improve Outcome: Adequate for Discharge Goal: Cardiovascular complication will be avoided Outcome: Adequate for Discharge   Problem: Nutrition: Goal: Adequate nutrition will be maintained Outcome: Adequate for Discharge   Problem: Coping: Goal: Level of anxiety will decrease Outcome: Adequate for Discharge   Problem: Safety: Goal: Ability to remain free from injury will improve Outcome: Adequate for  Discharge

## 2023-12-04 NOTE — Care Management Obs Status (Signed)
 MEDICARE OBSERVATION STATUS NOTIFICATION   Patient Details  Name: Donjuan Robison MRN: [id number] Date of Birth: 10/25/1955   Medicare Observation Status Notification Given:  Yes    MahabirNathanel, RN 12/04/2023, 3:41 PM

## 2023-12-04 NOTE — Progress Notes (Signed)
 1 Day Post-Op Subjective: Doing well overnight, ambulated 1x, not passing gas, 2 instances of mild nausea, pain improved, tolerating clear liquids.   Objective: Vital signs in last 24 hours: Temp:  [97.4 F (36.3 C)-98.4 F (36.9 C)] 98.1 F (36.7 C) (09/26 0400) Pulse Rate:  [58-92] 79 (09/26 0400) Resp:  [10-18] 18 (09/26 0400) BP: (113-160)/(70-99) 113/73 (09/26 0400) SpO2:  [93 %-100 %] 93 % (09/26 0400)  Intake/Output from previous day: 09/25 0701 - 09/26 0700 In: 5157.5 [P.O.:480; I.V.:3027.5; IV Piggyback:1650] Out: 2805 [Urine:1925; Drains:130; Blood:750] Intake/Output this shift: No intake/output data recorded.  Physical Exam:  General: Alert and oriented CV: RRR Lungs: Clear Abdomen: Soft, ND, ATTP; inc c/d/I, drain in place with serosanguinous output  GU: catheter in place with clear yellow urine  Ext: no pain   Lab Results: Recent Labs    12/03/23 1352 12/04/23 0422  HGB 11.9* 10.2*  HCT 38.8* 32.6*   BMET Recent Labs    12/03/23 1352 12/04/23 0422  NA 140 141  K 4.3 4.7  CL 106 109  CO2 22 23  GLUCOSE 184* 103*  BUN 17 17  CREATININE 0.93 0.77  CALCIUM  8.7* 8.5*     Studies/Results: No results found.  Assessment/Plan: 5 M w/ cT1cN1 PCa s/p RALP.   # Post RALP - advance to soft mechanical diet - DC fluids  - continue catheter  - continue drain - (fluid creatinine) - bowel regimen  - due to significant lysis of adhesions will wait to pass gas until discharge.   # acute blood loss anemia  - H&H around 12 if normal will start heparin     LOS: 0 days   Jackey Pea MD 12/04/2023, 7:05 AM [facility]

## 2023-12-05 DIAGNOSIS — Z7982 Long term (current) use of aspirin: Secondary | ICD-10-CM | POA: Diagnosis not present

## 2023-12-05 DIAGNOSIS — C61 Malignant neoplasm of prostate: Secondary | ICD-10-CM | POA: Diagnosis not present

## 2023-12-05 DIAGNOSIS — Z87891 Personal history of nicotine dependence: Secondary | ICD-10-CM | POA: Diagnosis not present

## 2023-12-05 DIAGNOSIS — I890 Lymphedema, not elsewhere classified: Secondary | ICD-10-CM | POA: Diagnosis not present

## 2023-12-05 DIAGNOSIS — I2510 Atherosclerotic heart disease of native coronary artery without angina pectoris: Secondary | ICD-10-CM | POA: Diagnosis not present

## 2023-12-05 LAB — BASIC METABOLIC PANEL WITH GFR
Anion gap: 13 (ref 5–15)
BUN: 12 mg/dL (ref 8–23)
CO2: 22 mmol/L (ref 22–32)
Calcium: 8.7 mg/dL — ABNORMAL LOW (ref 8.9–10.3)
Chloride: 105 mmol/L (ref 98–111)
Creatinine, Ser: 0.63 mg/dL (ref 0.61–1.24)
GFR, Estimated: >60 mL/min (ref >60)
Glucose, Bld: 136 mg/dL — ABNORMAL HIGH (ref 70–99)
Potassium: 3.9 mmol/L (ref 3.5–5.1)
Sodium: 139 mmol/L (ref 135–145)

## 2023-12-05 LAB — CBC
HCT: 32.2 % — ABNORMAL LOW (ref 39.0–52.0)
Hemoglobin: 10.2 g/dL — ABNORMAL LOW (ref 13.0–17.0)
MCH: 29.6 pg (ref 26.0–34.0)
MCHC: 31.7 g/dL (ref 30.0–36.0)
MCV: 93.3 fL (ref 80.0–100.0)
Platelets: 363 K/uL (ref 150–400)
RBC: 3.45 MIL/uL — ABNORMAL LOW (ref 4.22–5.81)
RDW: 15.7 % — ABNORMAL HIGH (ref 11.5–15.5)
WBC: 16.1 K/uL — ABNORMAL HIGH (ref 4.0–10.5)
nRBC: 0 % (ref 0.0–0.2)

## 2023-12-05 NOTE — Discharge Summary (Signed)
 Date of admission: 12/03/2023  Date of discharge: 12/05/2023  Admission diagnosis: Prostate Cancer  Discharge diagnosis: Prostate Cancer  History and Physical: For full details, please see admission history and physical. Briefly, [name] is a 68 y.o. gentleman with localized prostate cancer.  After discussing management/treatment options, he elected to proceed with surgical treatment.  Hospital Course: [name] was taken to the operating room on 12/03/2023 and underwent a robotic assisted laparoscopic radical prostatectomy. He tolerated this procedure well and without complications. Postoperatively, he was able to be transferred to a regular hospital room following recovery from anesthesia.  He was able to begin ambulating the night of surgery. He remained hemodynamically stable overnight.  He had excellent urine output with appropriately minimal output from his pelvic drain and his pelvic drain was removed on POD #1.  He was transitioned to oral pain medication, tolerated a clear liquid diet.   Given extensive lysis of adhesions required during surgery, the patient remained in the hospital until passing flatus which occurred on POD1. At this time, he had met all discharge criteria and was able to be discharged.  Laboratory values:  Recent Labs    12/04/23 0422 12/04/23 1201 12/05/23 0534  HGB 10.2* 9.5* 10.2*  HCT 32.6* 29.2* 32.2*    Disposition: Home  Discharge instruction: He was instructed to be ambulatory but to refrain from heavy lifting, strenuous activity, or driving. He was instructed on urethral catheter care.  Discharge medications:   Allergies as of 12/05/2023   No Known Allergies      Medication List     STOP taking these medications    aspirin  EC 81 MG tablet   cholecalciferol 25 MCG (1000 UNIT) tablet Commonly known as: VITAMIN D3   Multiple Vitamin tablet   traMADol  50 MG tablet Commonly known as: ULTRAM        TAKE these medications     diazepam  10 MG tablet Commonly known as: VALIUM  Take 10 mg by mouth at bedtime.   DULoxetine  60 MG capsule Commonly known as: CYMBALTA  Take 60 mg by mouth every evening.   ezetimibe  10 MG tablet Commonly known as: ZETIA  Take 1 tablet (10 mg total) by mouth daily. What changed: when to take this   HYDROcodone -acetaminophen  5-325 MG tablet Commonly known as: NORCO/VICODIN Take 1-2 tablets by mouth every 6 (six) hours as needed for moderate pain (pain score 4-6) or severe pain (pain score 7-10).   methocarbamol  750 MG tablet Commonly known as: ROBAXIN  Take 750 mg by mouth every 8 (eight) hours as needed for muscle spasms.   metoprolol  succinate 25 MG 24 hr tablet Commonly known as: Toprol  XL Take 0.5 tablets (12.5 mg total) by mouth daily.   pantoprazole  40 MG tablet Commonly known as: PROTONIX  Take 40 mg by mouth at bedtime.   polyethylene glycol powder 17 GM/SCOOP powder Commonly known as: MiraLax  Take 17 g by mouth daily. Dissolve 1 capful (17g) in 4-8 ounces of liquid and take by mouth daily.   rosuvastatin  20 MG tablet Commonly known as: CRESTOR  TAKE 1 TABLET BY MOUTH AT BEDTIME   sertraline  100 MG tablet Commonly known as: ZOLOFT  Take 200 mg by mouth at bedtime.   sulfamethoxazole -trimethoprim  800-160 MG tablet Commonly known as: BACTRIM  DS Take 1 tablet by mouth 2 (two) times daily. Start the day prior to foley removal appointment        Followup: He will followup in 1 week for catheter removal and to discuss his surgical pathology results.

## 2023-12-07 ENCOUNTER — Other Ambulatory Visit: Payer: Self-pay | Admitting: Internal Medicine

## 2023-12-07 LAB — TYPE AND SCREEN
ABO/RH(D): A POS
Antibody Screen: NEGATIVE
Unit division: 0
Unit division: 0

## 2023-12-07 LAB — BPAM RBC
Blood Product Expiration Date: 202510182359
Blood Product Expiration Date: 202510182359
Unit Type and Rh: 6200
Unit Type and Rh: 6200

## 2023-12-09 DIAGNOSIS — C61 Malignant neoplasm of prostate: Secondary | ICD-10-CM | POA: Diagnosis not present

## 2023-12-09 LAB — SURGICAL PATHOLOGY

## 2023-12-15 ENCOUNTER — Ambulatory Visit: Admitting: Physical Therapy

## 2023-12-15 ENCOUNTER — Encounter: Payer: Self-pay | Admitting: Physical Therapy

## 2023-12-15 ENCOUNTER — Other Ambulatory Visit: Payer: Self-pay

## 2023-12-15 DIAGNOSIS — M62838 Other muscle spasm: Secondary | ICD-10-CM | POA: Insufficient documentation

## 2023-12-15 DIAGNOSIS — R279 Unspecified lack of coordination: Secondary | ICD-10-CM | POA: Insufficient documentation

## 2023-12-15 DIAGNOSIS — R293 Abnormal posture: Secondary | ICD-10-CM | POA: Diagnosis not present

## 2023-12-15 NOTE — Therapy (Signed)
 OUTPATIENT PHYSICAL THERAPY MALE PELVIC EVALUATION   Patient Name: [name] MRN: [id number] DOB:Jun 17, 1955, 68 y.o., male Today's Date: 12/15/2023  END OF SESSION:  PT End of Session - 12/15/23 1550     Visit Number 1    Number of Visits 8    Date for Recertification  02/09/24    Authorization Type [employer] MCR    PT Start Time 0200    PT Stop Time 0245    PT Time Calculation (min) 45 min    Activity Tolerance Patient tolerated treatment well    Behavior During Therapy WFL for tasks assessed/performed          Past Medical History:  Diagnosis Date   Anxiety    Arthritis    Cancer (HCC)    prostate cancer   Current smoker    Degenerative cervical disc    Depression    ED (erectile dysfunction)    Elevated PSA    Headache    HLD (hyperlipidemia)    Myocardial infarction (HCC)    PONV (postoperative nausea and vomiting)    Pre-diabetes    Prediabetes    Spinal stenosis    Past Surgical History:  Procedure Laterality Date   LAPAROSCOPIC TOTAL PELVIC LYMPHADENECTOMY N/A 12/03/2023   Procedure: LAPAROSCOPIC BILATERAL PELVIC LYMPHADENECTOMY;  Surgeon: Shane Steffan BROCKS, MD;  Location: [facility];  Service: Urology;  Laterality: N/A;   LEFT HEART CATH AND CORONARY ANGIOGRAPHY N/A 05/18/2023   Procedure: LEFT HEART CATH AND CORONARY ANGIOGRAPHY;  Surgeon: Mady Bruckner, MD;  Location: [facility];  Service: Cardiovascular;  Laterality: N/A;   MANDIBLE FRACTURE SURGERY     ROBOT ASSISTED LAPAROSCOPIC RADICAL PROSTATECTOMY N/A 12/03/2023   Procedure: ROBOTIC ASSISTED LAPAROSCOPIC RADICAL PROSTATECTOMY;  Surgeon: Shane Steffan BROCKS, MD;  Location: [facility];  Service: Urology;  Laterality: N/A;   SPLENECTOMY     UMBILICAL HERNIA REPAIR     Patient Active Problem List   Diagnosis Date Noted   Prostate cancer (HCC) 09/03/2023   Leukocytosis 06/05/2023   Night sweats 06/05/2023   Quit smoking in March 2025 06/05/2023   Spinal stenosis of lumbar region 08/27/2020    Impingement syndrome of right shoulder 12/24/2016   Chronic right shoulder pain 11/26/2016   Neck pain 11/04/2016   Chronic bilateral low back pain with bilateral sciatica 11/04/2016    PCP: Gerome Brunet, DO  REFERRING PROVIDER: Claudene Waddell HERO, PA-C   REFERRING DIAG: JOSH (ICD-10-CM) - Malignant neoplasm of prostate  THERAPY DIAG:  Other muscle spasm - Plan: PT plan of care cert/re-cert  Unspecified lack of coordination - Plan: PT plan of care cert/re-cert  Abnormal posture - Plan: PT plan of care cert/re-cert  Rationale for Evaluation and Treatment: Rehabilitation  ONSET DATE: 12/03/23  SUBJECTIVE:  SUBJECTIVE STATEMENT: Patient reports that he had a prostatectomy and lymph node removal on September 25th, 2025. He is feeling sore in the surgical sites and heaviness in the pelvis/abdomen. He feels like he urinates constantly and is unable to control his urine.  Fluid intake: 4 cups of water  per day, sweet tea every once in a while, 2 cups of coffee per day  PAIN:  Are you having pain? No NPRS scale: 3-4/10 Pain location: Internal, Right, and Anterior  Pain type: aching and dull Pain description: intermittent   Aggravating factors: coughing  Relieving factors: pain relievers, tylenol    PRECAUTIONS: None  RED FLAGS: None and Bowel or bladder incontinence: Yes: secondary to prostatectomy    WEIGHT BEARING RESTRICTIONS: No  FALLS:  Has patient fallen in last 6 months? No  OCCUPATION: retired   PLOF: Independent  PATIENT GOALS: to lessen the leakage, to strengthen the bladder musculature   PERTINENT HISTORY:  Spleen removal in 2015, hernia repair   BOWEL MOVEMENT: Pain with bowel movement: No Type of bowel movement:Type (Bristol Stool Scale) 4-6, Frequency 1-3, Strain tries  not to , and Splinting no Fully empty rectum: No Leakage: No Pads: No Fiber supplement: Yes: miralax  if needed   URINATION: Pain with urination: No Fully empty bladder: No Stream: Strong Urgency: Yes:   Frequency: more frequently than in the past  Leakage: Urge to void, Walking to the bathroom, Coughing, Laughing, Lifting, and Bending forward and in the nighttime  Pads: Yes: 2 diapers and 5 pads per day   INTERCOURSE: not planning to return to sexual activity  OBJECTIVE:  Note: Objective measures were completed at Evaluation unless otherwise noted.  PATIENT SURVEYS:  PFIQ-7 65  COGNITION: Overall cognitive status: Within functional limits for tasks assessed     SENSATION: Light touch: Appears intact Proprioception: Appears intact  MUSCLE LENGTH: within normal limits for all motions tested bilaterally with no pain   LUMBAR SPECIAL TESTS:  Single leg stance test: Positive  FUNCTIONAL TESTS:  Squat: bilateral dynamic knee valgus with loading   GAIT: Assistive device utilized: None Level of assistance: Complete Independence Comments: moderate trendelenburg gait pattern with ambulation   POSTURE: rounded shoulders and forward head  PELVIC ALIGNMENT: within normal limits bilaterally   LUMBARAROM/PROM:  A/PROM A/PROM  eval  Flexion 75% available  Extension 75% available  Right lateral flexion WNL  Left lateral flexion WNL  Right rotation 75% available  Left rotation 75% available   (Blank rows = not tested)  LOWER EXTREMITY AROM/PROM: within normal limits for all motions tested bilaterally with no pain  LOWER EXTREMITY MMT: 3+/5 bilateral knees and hips grossly  PALPATION: GENERAL no tenderness to palpation of abdominal scars secondary to prostatectomy               External exam: apical breathing pattern with decreased lower rib excursion during inhalation/exhalation              Internal Pelvic Floor not performed today due to time limitations  Patient  confirms identification and approves PT to assess internal pelvic floor and treatment No  PELVIC MMT: not performed today due to time limitations    MMT eval  Internal Anal Sphincter   External Anal Sphincter   Puborectalis   Diastasis Recti 2 finger widths at umbilicus   (Blank rows = not tested)  TONE: High throughout abdominal region  TODAY'S TREATMENT:  DATE:   EVAL 12/15/23: Examination completed, findings reviewed, pt educated on POC, HEP, and self care. Pt motivated to participate in PT and agreeable to attempt recommendations.   Water  intake recommendation: first thing in morning, last thing at night, ounces based on body weight number  Double voiding technique for emptying bladder when urinating  Bladder training schedule: every hour for 1 week, then increasing to every 1.5 hours, 2 hours, etc Hooklying diaphragmatic breathing + transverse abdominis activation on exhalation 2x10   PATIENT EDUCATION:  Education details: Water  intake recommendation: first thing in morning, last thing at night, ounces based on body weight number; Double voiding technique for emptying bladder when urinating; Bladder training schedule: every hour for 1 week, then increasing to every 1.5 hours, 2 hours, etc Person educated: Patient Education method: Explanation, Demonstration, Tactile cues, Verbal cues, and Handouts Education comprehension: verbalized understanding, returned demonstration, verbal cues required, tactile cues required, and needs further education  HOME EXERCISE PROGRAM: Hooklying diaphragmatic breathing + transverse abdominis activation on exhalation 2x10   ASSESSMENT:  CLINICAL IMPRESSION: Patient is a 68 y.o. male who was seen today for physical therapy evaluation and treatment s/p  prostatectomy and lymph node removal on September 25th, 2025. He is feeling  sore in the surgical sites (3-4/10 pain rating) and heaviness in the pelvis/abdomen. He feels like he urinates constantly and is unable to control his urine. Patient demonstrates abdominal tenderness around scar sites and pain in the right lower quadrant with coughing. Lumbar range of motion, hip strength, core strength, and rib mobility are limited in nature and patient demonstrates a lack of coordination between the diaphragm and pelvic floor musculature. Pt educated on the following: Water  intake recommendation (ounces based on body weight number); Double voiding technique for emptying bladder when urinating; Bladder training schedule: every hour for 1 week, then increasing to every 1.5 hours, 2 hours, etc. And general anatomy. Overall, pt tolerated session well and Pt would benefit from additional PT to further address deficits.     OBJECTIVE IMPAIRMENTS: decreased coordination, decreased endurance, decreased mobility, decreased ROM, decreased strength, and pain.   ACTIVITY LIMITATIONS: carrying, lifting, bending, sitting, standing, squatting, stairs, transfers, bed mobility, continence, and toileting  PARTICIPATION LIMITATIONS: interpersonal relationship and community activity  PERSONAL FACTORS: Age, Past/current experiences, and Time since onset of injury/illness/exacerbation are also affecting patient's functional outcome.   REHAB POTENTIAL: Good  CLINICAL DECISION MAKING: Stable/uncomplicated  EVALUATION COMPLEXITY: Low   GOALS: Goals reviewed with patient? Yes  SHORT TERM GOALS: Target date: 01/12/2024  Pt will be independent with HEP.  Baseline: Goal status: INITIAL  2.  Pt will be independent with use of squatty potty, relaxed toileting mechanics, and improved bowel movement techniques in order to increase ease of bowel movements and complete evacuation.  Baseline:  Goal status: INITIAL  3.  Pt will be independent with the knack, urge suppression technique, and double voiding  in order to improve bladder habits and decrease urinary incontinence.  Baseline:  Goal status: INITIAL  4.  Pt will report 50% reduction of pain due to improvements in posture, strength, and muscle length in abdomen and pelvis to improve quality of life.  Baseline:  Goal status: INITIAL  LONG TERM GOALS: Target date: 03/16/2024  Pt will be independent with advanced HEP.  Baseline:  Goal status: INITIAL  2.  Pt to demonstrate improved coordination of pelvic floor and breathing mechanics with 10# squat with appropriate synergistic patterns to decrease pain and leakage at least 75% of the  time for improved ability to complete a 30 minute workout with strain at pelvic floor and symptoms.   Baseline:  Goal status: INITIAL  3.  Pt will have to use less than 2 diapers per day to save on spending from purchasing diapers and to suggest improved urinary control/function.  Baseline:  Goal status: INITIAL  4.  Pt will be able to lift at least 10 lb correctly for 10 reps without pain or leakage for functional activities and to allow patient to perform yard/house work without urinary leakage being a limiting factor.  Baseline:  Goal status: INITIAL  PLAN:  PT FREQUENCY: 1-2x/week  PT DURATION: 6 months  PLANNED INTERVENTIONS: 97110-Therapeutic exercises, 97530- Therapeutic activity, 97112- Neuromuscular re-education, 97535- Self Care, [id number]- Manual therapy, Patient/Family education, Balance training, Stair training, Taping, Joint mobilization, Spinal mobilization, Scar mobilization, Cryotherapy, Moist heat, and Biofeedback  PLAN FOR NEXT SESSION: continued pelvic floor AROM in seated, scar site manual therapy, core strengthening and stretching, toileting mechanics for optimal emptying, urge drill   Celena JAYSON Domino, PT 12/15/2023, 4:14 PM [facility] [address], [address] [place], [place] [address] Phone # [phone] Fax [phone]

## 2023-12-15 NOTE — Patient Instructions (Signed)
 Water  intake recommendation:  First thing you consume and the last consume should be water   Your goal should be roughly 80 oz of water  per day, try to gradually build up to this Toileting schedule: When you wake up, go to the bathroom to pee immediately, even if you don't to  Every hour on the hour, go to the bathroom to pee even if you do not have to. We are retraining your bladder  This does NOT apply to nighttime, please do not wake every hour in the night  How to ensure you are fully emptying your bladder: When you start to pee, do not PUSH the urine out, let it flow naturally  When you think you are done, this is when you get permission to push and see if there is any more urine to be expelled (double voiding technique)

## 2023-12-16 NOTE — Progress Notes (Signed)
 Cardiology Office Note:  .   Date:  12/17/2023 ID:  [name], DOB [birth year], MRN [id number] PCP: Gerome Brunet, DO [facility] Providers Cardiologist:  None   Patient Profile: .      PMH Coronary artery disease LHC 05/18/23 Severe 2 vessel CAD with chronic total occlusions of proximal/mid LCx and RCA Minimal luminal irregularities in LAD and large OM1 branch Normal LVEDP Normal LVEF Former tobacco abuse Quit smoking May 15 2023 Hyperlipidemia GERD Pre-diabetes Pancreatitis  Aortic atherosclerosis Emphysema Lactose intolerance Chronic leukocystosis  Referred to cardiology and seen by me on 04/08/2023 as a new patient for coronary artery calcification seen on lung cancer screening CT. 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 Was told he had a heart artery blockage about a year ago, seen on abdominal CT. He underwent recent lung scan which also revealed three-vessel coronary atherosclerosis. He was asymptomatic with cycling 3 miles on stationary bike. Wife is concerned about his diet and they have both been trying to eat better and get more exercise. He has a preference for snacks and large bowls of cereal (frosted mini-wheats with 2% milk). Unfortunately, he continues to smoke half a pack a day from a pack a day. His wife has recently quit smoking after being diagnosed with COPD. He reported excessive sweating at night, a condition that has persisted for several years but no diaphoresis with exertion. Has been on cholesterol medicine for a long time. Family history is significant for his mother who has high cholesterol and CAD. Unfortunately his father died from lung cancer.  He is unsure whether there was any evidence of heart disease. ASCVD risk score 16.3%.   CT Calcium  score was ordered for risk stratification and revealed significantly elevated CAC score 3188 (99th percentile). Due to  this score, he underwent lexiscan  myoview  05/14/2023 that revealed large defect in apical to basal inferior and inferoseptal location consistent with infarction, felt to represent inferior infarct pattern with global hypokinesis, visually and quantitative felt to represent balanced ischemia. Due to abnormal result, he was seen by Dr. Alveta 05/15/23 for consideration of cardiac cath. He reported physical limitations felt to limit his exertion. [facility] was [facility] which revealed severe 2 vessel CAD with chronic total occlusions of prox/mid LCx and  RCA, minimal luminal irregularities in LAD and large OM1 branch, normal LVEDP and LVEF.    He was referred to hem/onc for evaluation of leukocytosis.   Seen in clinic by me on 06/10/23 for follow-up of CAD. He is here today with his wife. Reports he is feeling a little sluggish since the cardiac cath.  We reviewed cath results in detail and all questions were answered to his satisfaction.  He remains active with stationary cycling and water  aerobics at the [facility]. His wife has been encouraging him to lift weights, but he has been hesitant to start.  He is working on dietary changes to reduce intake of saturated fat, reduce portion sizes, and reduce salt and sugar.  He denies chest pain, shortness of breath, lower extremity edema, palpitations, melena, presyncope, syncope, orthopnea, and PND.    Cardiac evaluation for prostatectomy 11/20/23, DASI 6.21 METS and RCRI 0.9% for MACE.     History of Present Illness: .   [name] is a very pleasant 68 y.o. male  who is here today    ROS: See HPI  Studies Reviewed: .          Risk Assessment/Calculations:             Physical Exam:   VS: BP 102/74   Pulse 69   Ht 5' 9 (1.753 m)   Wt 171 lb 9.6 oz (77.8 kg)   SpO2 96%   BMI 25.34 kg/m   Wt Readings from Last 3 Encounters:  12/17/23 171 lb 9.6 oz (77.8 kg)  12/03/23 165 lb 5.5 oz (75 kg)  11/25/23 165 lb 5.5 oz (75 kg)     GEN:  Well nourished, well developed in no acute distress NECK: No JVD; No carotid bruits CARDIAC: RRR, no murmurs, rubs, gallops RESPIRATORY:  Clear to auscultation without rales, wheezing or rhonchi  ABDOMEN: Soft, non-tender, non-distended EXTREMITIES:  No edema; No deformity     ASSESSMENT AND PLAN: .    Coronary artery disease: LHC 05/18/23 revealed chronic total occlusions of proximal/mid LCx and RCA, minimal irregularities LAD and OM1 recommended for medical Rx. Review of images by Dr. [place] with advisement that not a likely candidate for CTO PCI, recommend medical Rx. Metoprolol  was added 05/18/23 and pt reports he has been feeling a little fatigued since that time.  He is working out consistently and denies chest pain, shortness of breath, palpitations, or other symptoms concerning for angina.  Reviewed indications for medical therapy and encouraged him to continue to monitor and report if fatigue does not improve. No bleeding concerns.  Continue aspirin , ezetimibe , metoprolol , rosuvastatin .  Advised he may hold aspirin  for 5 to 7 days for upcoming spinal injection. Focus on secondary prevention including heart healthy mostly plant based diet avoiding saturated fat, processed foods, simple carbohydrates, and sugar along with aiming for at least 150 minutes of moderate intensity exercise each week.   Fatigue: Notes increased fatigue since [facility] 05/18/23. Advised this may be due to the addition of metoprolol . Encouraged him to continue to monitor and report persistent concerns.  He is already taking metoprolol  at night.  Could consider low-dose bisoprolol or nebivolol in place of metoprolol  if symptoms persist.   Hyperlipidemia LDL goal < 55: NMR lipid profile 06/04/2023 with LDL particle number 1376, LDL-C 102, HDL-C 36, triglycerides 143, total cholesterol 163, and small LDL-P 626.  Improvement from LDL 1 05 January 2023.  Nexletol  was prescribed but not covered by insurance.  He was required to try  ezetimibe  10 mg daily first.  This was started in addition to rosuvastatin  20 mg daily, however he has not demonstrated improvement to goal.  We will retry Nexletol  to see if it will be covered by insurance. Consider PCSK9i if bempedoic acid  is cost prohibitive. He is working on eating a healthier diet avoiding saturated fat, sugar and salt and has recently increased his physical activity.   Leukocytosis: Seen by hematology 06/04/23, awaiting review of lab results. No acute concerns today. Management per hematologist.   Tobacco abuse: He quit smoking May 15, 2023. I congratulated him on this achievement.         Disposition: ***  Signed, Rosaline Bane, NP-C

## 2023-12-17 ENCOUNTER — Ambulatory Visit (HOSPITAL_BASED_OUTPATIENT_CLINIC_OR_DEPARTMENT_OTHER): Admitting: Nurse Practitioner

## 2023-12-17 ENCOUNTER — Encounter (HOSPITAL_BASED_OUTPATIENT_CLINIC_OR_DEPARTMENT_OTHER): Payer: Self-pay | Admitting: Nurse Practitioner

## 2023-12-17 VITALS — BP 102/74 | HR 69 | Ht 69.0 in | Wt 171.6 lb

## 2023-12-17 DIAGNOSIS — Z87891 Personal history of nicotine dependence: Secondary | ICD-10-CM

## 2023-12-17 DIAGNOSIS — D72829 Elevated white blood cell count, unspecified: Secondary | ICD-10-CM

## 2023-12-17 DIAGNOSIS — I2510 Atherosclerotic heart disease of native coronary artery without angina pectoris: Secondary | ICD-10-CM | POA: Diagnosis not present

## 2023-12-17 DIAGNOSIS — E785 Hyperlipidemia, unspecified: Secondary | ICD-10-CM

## 2023-12-17 NOTE — Patient Instructions (Signed)
 Medication Instructions:   Your physician recommends that you continue on your current medications as directed. Please refer to the Current Medication list given to you today.   *If you need a refill on your cardiac medications before your next appointment, please call your pharmacy*  Lab Work:  None ordered.  If you have labs (blood work) drawn today and your tests are completely normal, you will receive your results only by: MyChart Message (if you have MyChart) OR A paper copy in the mail If you have any lab test that is abnormal or we need to change your treatment, we will call you to review the results.  Testing/Procedures:  None ordered.  Follow-Up: At [facility], you and your health needs are our priority.  As part of our continuing mission to provide you with exceptional heart care, our providers are all part of one team.  This team includes your primary Cardiologist (physician) and Advanced Practice Providers or APPs (Physician Assistants and Nurse Practitioners) who all work together to provide you with the care you need, when you need it.  Your next appointment:   6 month(s)  Provider:   Dr. Kate   We recommend signing up for the patient portal called MyChart.  Sign up information is provided on this After Visit Summary.  MyChart is used to connect with patients for Virtual Visits (Telemedicine).  Patients are able to view lab/test results, encounter notes, upcoming appointments, etc.  Non-urgent messages can be sent to your provider as well.   To learn more about what you can do with MyChart, go to [email or web address].   Other Instructions  Your physician wants you to follow-up in: 6 months.  You will receive a reminder letter in the mail two months in advance. If you don't receive a letter, please call our office to schedule the follow-up appointment.  YOUR GOAL FOR YOUR LDL (BAD CHOLESTEROL) IS 55 OR BELOW.

## 2023-12-18 DIAGNOSIS — N393 Stress incontinence (female) (male): Secondary | ICD-10-CM | POA: Diagnosis not present

## 2023-12-18 DIAGNOSIS — C61 Malignant neoplasm of prostate: Secondary | ICD-10-CM | POA: Diagnosis not present

## 2023-12-30 ENCOUNTER — Ambulatory Visit: Payer: Self-pay | Admitting: Physical Therapy

## 2023-12-30 DIAGNOSIS — R279 Unspecified lack of coordination: Secondary | ICD-10-CM | POA: Diagnosis not present

## 2023-12-30 DIAGNOSIS — M62838 Other muscle spasm: Secondary | ICD-10-CM

## 2023-12-30 DIAGNOSIS — R293 Abnormal posture: Secondary | ICD-10-CM

## 2023-12-30 NOTE — Patient Instructions (Addendum)
 Voiding schedule for the next week: try going every 30 minutes after you wake up and initially urinate  Blow as you go: every time you transfer into and out of a car, into and out of bed, or rolling over in bed, you should EXHALE while you do so to prevent too much pressure through your abdomen and pelvic floor

## 2023-12-30 NOTE — Therapy (Signed)
 OUTPATIENT PHYSICAL THERAPY MALE PELVIC TREATMENT   Patient Name: [name] MRN: [id number] DOB:November 25, 1955, 68 y.o., male Today's Date: 12/30/2023  END OF SESSION:  PT End of Session - 12/30/23 1102     Visit Number 2    Number of Visits 8    Date for Recertification  02/09/24    Authorization Type [employer] MCR    PT Start Time 1015    PT Stop Time 1100    PT Time Calculation (min) 45 min    Activity Tolerance Patient tolerated treatment well    Behavior During Therapy WFL for tasks assessed/performed           Past Medical History:  Diagnosis Date   Anxiety    Arthritis    Cancer (HCC)    prostate cancer   Current smoker    Degenerative cervical disc    Depression    ED (erectile dysfunction)    Elevated PSA    Headache    HLD (hyperlipidemia)    Myocardial infarction (HCC)    PONV (postoperative nausea and vomiting)    Pre-diabetes    Prediabetes    Spinal stenosis    Past Surgical History:  Procedure Laterality Date   LAPAROSCOPIC TOTAL PELVIC LYMPHADENECTOMY N/A 12/03/2023   Procedure: LAPAROSCOPIC BILATERAL PELVIC LYMPHADENECTOMY;  Surgeon: Shane Steffan BROCKS, MD;  Location: [facility];  Service: Urology;  Laterality: N/A;   LEFT HEART CATH AND CORONARY ANGIOGRAPHY N/A 05/18/2023   Procedure: LEFT HEART CATH AND CORONARY ANGIOGRAPHY;  Surgeon: Mady Bruckner, MD;  Location: [facility];  Service: Cardiovascular;  Laterality: N/A;   MANDIBLE FRACTURE SURGERY     ROBOT ASSISTED LAPAROSCOPIC RADICAL PROSTATECTOMY N/A 12/03/2023   Procedure: ROBOTIC ASSISTED LAPAROSCOPIC RADICAL PROSTATECTOMY;  Surgeon: Shane Steffan BROCKS, MD;  Location: [facility];  Service: Urology;  Laterality: N/A;   SPLENECTOMY     UMBILICAL HERNIA REPAIR     Patient Active Problem List   Diagnosis Date Noted   Prostate cancer (HCC) 09/03/2023   Leukocytosis 06/05/2023   Night sweats 06/05/2023   Quit smoking in March 2025 06/05/2023   Spinal stenosis of lumbar region  08/27/2020   Impingement syndrome of right shoulder 12/24/2016   Chronic right shoulder pain 11/26/2016   Neck pain 11/04/2016   Chronic bilateral low back pain with bilateral sciatica 11/04/2016    PCP: Gerome Brunet, DO  REFERRING PROVIDER: Claudene Waddell HERO, PA-C   REFERRING DIAG: JOSH (ICD-10-CM) - Malignant neoplasm of prostate  THERAPY DIAG:  Other muscle spasm  Unspecified lack of coordination  Abnormal posture  Rationale for Evaluation and Treatment: Rehabilitation  ONSET DATE: 12/03/23  SUBJECTIVE:  SUBJECTIVE STATEMENT: Patient reports that he only got up 3 times last night to go to the bathroom, and has been consistent with his HEP. He has been better about letting his urine flow out and is doing the double voiding technique.   Eval: Patient reports that he had a prostatectomy and lymph node removal on September 25th, 2025. He is feeling sore in the surgical sites and heaviness in the pelvis/abdomen. He feels like he urinates constantly and is unable to control his urine.  Fluid intake: 4 cups of water  per day, sweet tea every once in a while, 2 cups of coffee per day  PAIN:  Are you having pain? No NPRS scale: 3-4/10 Pain location: Internal, Right, and Anterior  Pain type: aching and dull Pain description: intermittent   Aggravating factors: coughing  Relieving factors: pain relievers, tylenol    PRECAUTIONS: None  RED FLAGS: None and Bowel or bladder incontinence: Yes: secondary to prostatectomy    WEIGHT BEARING RESTRICTIONS: No  FALLS:  Has patient fallen in last 6 months? No  OCCUPATION: retired   PLOF: Independent  PATIENT GOALS: to lessen the leakage, to strengthen the bladder musculature   PERTINENT HISTORY:  Spleen removal in 2015, hernia repair   BOWEL  MOVEMENT: Pain with bowel movement: No Type of bowel movement:Type (Bristol Stool Scale) 4-6, Frequency 1-3, Strain tries not to , and Splinting no Fully empty rectum: No Leakage: No Pads: No Fiber supplement: Yes: miralax  if needed   URINATION: Pain with urination: No Fully empty bladder: No Stream: Strong Urgency: Yes:   Frequency: more frequently than in the past  Leakage: Urge to void, Walking to the bathroom, Coughing, Laughing, Lifting, and Bending forward and in the nighttime  Pads: Yes: 2 diapers and 5 pads per day   INTERCOURSE: not planning to return to sexual activity  OBJECTIVE:  Note: Objective measures were completed at Evaluation unless otherwise noted.  PATIENT SURVEYS:  PFIQ-7 65  COGNITION: Overall cognitive status: Within functional limits for tasks assessed     SENSATION: Light touch: Appears intact Proprioception: Appears intact  MUSCLE LENGTH: within normal limits for all motions tested bilaterally with no pain   LUMBAR SPECIAL TESTS:  Single leg stance test: Positive  FUNCTIONAL TESTS:  Squat: bilateral dynamic knee valgus with loading   GAIT: Assistive device utilized: None Level of assistance: Complete Independence Comments: moderate trendelenburg gait pattern with ambulation   POSTURE: rounded shoulders and forward head  PELVIC ALIGNMENT: within normal limits bilaterally   LUMBARAROM/PROM:  A/PROM A/PROM  eval  Flexion 75% available  Extension 75% available  Right lateral flexion WNL  Left lateral flexion WNL  Right rotation 75% available  Left rotation 75% available   (Blank rows = not tested)  LOWER EXTREMITY AROM/PROM: within normal limits for all motions tested bilaterally with no pain  LOWER EXTREMITY MMT: 3+/5 bilateral knees and hips grossly  PALPATION: GENERAL no tenderness to palpation of abdominal scars secondary to prostatectomy               External exam: apical breathing pattern with decreased lower rib  excursion during inhalation/exhalation              Internal Pelvic Floor not performed today due to time limitations  Patient confirms identification and approves PT to assess internal pelvic floor and treatment No  PELVIC MMT: not performed today due to time limitations    MMT eval  Internal Anal Sphincter   External Anal Sphincter   Puborectalis  Diastasis Recti 2 finger widths at umbilicus   (Blank rows = not tested)  TONE: High throughout abdominal region  TODAY'S TREATMENT:                                                                                                                              DATE:   EVAL 12/15/23: Examination completed, findings reviewed, pt educated on POC, HEP, and self care. Pt motivated to participate in PT and agreeable to attempt recommendations.   Water  intake recommendation: first thing in morning, last thing at night, ounces based on body weight number  Double voiding technique for emptying bladder when urinating  Bladder training schedule: every hour for 1 week, then increasing to every 1.5 hours, 2 hours, etc Hooklying diaphragmatic breathing + transverse abdominis activation on exhalation 2x10   12/30/23:  - Seated Diaphragmatic Breathing  - 1 x daily - 7 x weekly - 2 sets - 10 reps - Sit to Stand Without Arm Support  - 1 x daily - 7 x weekly - 2 sets - 10 reps - Supine Bridge with Mini Swiss Ball Between Knees  - 1 x daily - 7 x weekly - 2 sets - 10 reps - Clamshell  - 1 x daily - 7 x weekly - 2 sets - 10 reps - Sidelying Reverse Clamshell  - 1 x daily - 7 x weekly - 2 sets - 10 reps - Supine Butterfly Groin Stretch  - 1 x daily - 7 x weekly - 2 sets - hold - Sidelying Thoracic Rotation with Open Book  - 1 x daily - 7 x weekly - 2 sets - 10 reps - Manual to abdominal scars from prostatectomy   PATIENT EDUCATION:  Education details: Water  intake recommendation: first thing in morning, last thing at night, ounces based on body weight  number; Double voiding technique for emptying bladder when urinating; Bladder training schedule: every hour for 1 week, then increasing to every 1.5 hours, 2 hours, etc Person educated: Patient Education method: Explanation, Demonstration, Tactile cues, Verbal cues, and Handouts Education comprehension: verbalized understanding, returned demonstration, verbal cues required, tactile cues required, and needs further education  HOME EXERCISE PROGRAM: Access Code: [email or web address] URL: [email or web address] Date: 12/30/2023 Prepared by: Celena Domino  Exercises - Seated Diaphragmatic Breathing  - 1 x daily - 7 x weekly - 2 sets - 10 reps - Sit to Stand Without Arm Support  - 1 x daily - 7 x weekly - 2 sets - 10 reps - Supine Bridge with Mini Swiss Ball Between Knees  - 1 x daily - 7 x weekly - 2 sets - 10 reps - Clamshell  - 1 x daily - 7 x weekly - 2 sets - 10 reps - Sidelying Reverse Clamshell  - 1 x daily - 7 x weekly - 2 sets - 10 reps - Supine Butterfly Groin Stretch  - 1 x daily - 7 x weekly - 2 sets -  hold - Sidelying Thoracic Rotation with Open Book  - 1 x daily - 7 x weekly - 2 sets - 10 reps  ASSESSMENT:  CLINICAL IMPRESSION: Patient is a 69 y.o. male who was seen today for physical therapy treatment s/p prostatectomy and lymph node removal on September 25th, 2025. Pt doing well today with minimal pain and has been consistent with HEP. He has been following a voiding schedule every 30 minutes. He is waking less at night also. Core training and hip strengthening introduced today and pelvic floor training progressed to seated position. Manual therapy performed to abdomen to promote scar tissue motility. Overall, pt tolerated session well and Pt would benefit from additional PT to further address deficits.     OBJECTIVE IMPAIRMENTS: decreased coordination, decreased endurance, decreased mobility, decreased ROM, decreased strength, and pain.   ACTIVITY LIMITATIONS: carrying,  lifting, bending, sitting, standing, squatting, stairs, transfers, bed mobility, continence, and toileting  PARTICIPATION LIMITATIONS: interpersonal relationship and community activity  PERSONAL FACTORS: Age, Past/current experiences, and Time since onset of injury/illness/exacerbation are also affecting patient's functional outcome.   REHAB POTENTIAL: Good  CLINICAL DECISION MAKING: Stable/uncomplicated  EVALUATION COMPLEXITY: Low   GOALS: Goals reviewed with patient? Yes  SHORT TERM GOALS: Target date: 01/12/2024  Pt will be independent with HEP.  Baseline: Goal status: INITIAL  2.  Pt will be independent with use of squatty potty, relaxed toileting mechanics, and improved bowel movement techniques in order to increase ease of bowel movements and complete evacuation.  Baseline:  Goal status: INITIAL  3.  Pt will be independent with the knack, urge suppression technique, and double voiding in order to improve bladder habits and decrease urinary incontinence.  Baseline:  Goal status: INITIAL  4.  Pt will report 50% reduction of pain due to improvements in posture, strength, and muscle length in abdomen and pelvis to improve quality of life.  Baseline:  Goal status: INITIAL  LONG TERM GOALS: Target date: 03/16/2024  Pt will be independent with advanced HEP.  Baseline:  Goal status: INITIAL  2.  Pt to demonstrate improved coordination of pelvic floor and breathing mechanics with 10# squat with appropriate synergistic patterns to decrease pain and leakage at least 75% of the time for improved ability to complete a 30 minute workout with strain at pelvic floor and symptoms.   Baseline:  Goal status: INITIAL  3.  Pt will have to use less than 2 diapers per day to save on spending from purchasing diapers and to suggest improved urinary control/function.  Baseline:  Goal status: INITIAL  4.  Pt will be able to lift at least 10 lb correctly for 10 reps without pain or leakage for  functional activities and to allow patient to perform yard/house work without urinary leakage being a limiting factor.  Baseline:  Goal status: INITIAL  PLAN:  PT FREQUENCY: 1-2x/week  PT DURATION: 6 months  PLANNED INTERVENTIONS: 97110-Therapeutic exercises, 97530- Therapeutic activity, 97112- Neuromuscular re-education, 97535- Self Care, [id number]- Manual therapy, Patient/Family education, Balance training, Stair training, Taping, Joint mobilization, Spinal mobilization, Scar mobilization, Cryotherapy, Moist heat, and Biofeedback  PLAN FOR NEXT SESSION: continued pelvic floor AROM in seated, scar site manual therapy, core strengthening and stretching, toileting mechanics for optimal emptying, urge drill   Celena JAYSON Domino, PT 12/30/2023, 11:02 AM [facility] [address] [place], [place] [address] Phone # [phone] Fax [phone]

## 2024-01-01 DIAGNOSIS — R399 Unspecified symptoms and signs involving the genitourinary system: Secondary | ICD-10-CM | POA: Diagnosis not present

## 2024-01-01 DIAGNOSIS — R7309 Other abnormal glucose: Secondary | ICD-10-CM | POA: Diagnosis not present

## 2024-01-01 DIAGNOSIS — E785 Hyperlipidemia, unspecified: Secondary | ICD-10-CM | POA: Diagnosis not present

## 2024-01-01 DIAGNOSIS — Z79899 Other long term (current) drug therapy: Secondary | ICD-10-CM | POA: Diagnosis not present

## 2024-01-01 DIAGNOSIS — I2510 Atherosclerotic heart disease of native coronary artery without angina pectoris: Secondary | ICD-10-CM | POA: Diagnosis not present

## 2024-01-05 DIAGNOSIS — M48061 Spinal stenosis, lumbar region without neurogenic claudication: Secondary | ICD-10-CM | POA: Diagnosis not present

## 2024-01-08 DIAGNOSIS — I2510 Atherosclerotic heart disease of native coronary artery without angina pectoris: Secondary | ICD-10-CM | POA: Diagnosis not present

## 2024-01-08 DIAGNOSIS — Z0000 Encounter for general adult medical examination without abnormal findings: Secondary | ICD-10-CM | POA: Diagnosis not present

## 2024-01-08 DIAGNOSIS — M5136 Other intervertebral disc degeneration, lumbar region with discogenic back pain only: Secondary | ICD-10-CM | POA: Diagnosis not present

## 2024-01-08 DIAGNOSIS — R7309 Other abnormal glucose: Secondary | ICD-10-CM | POA: Diagnosis not present

## 2024-01-08 DIAGNOSIS — F419 Anxiety disorder, unspecified: Secondary | ICD-10-CM | POA: Diagnosis not present

## 2024-01-08 DIAGNOSIS — E785 Hyperlipidemia, unspecified: Secondary | ICD-10-CM | POA: Diagnosis not present

## 2024-01-08 DIAGNOSIS — Z8546 Personal history of malignant neoplasm of prostate: Secondary | ICD-10-CM | POA: Diagnosis not present

## 2024-01-08 DIAGNOSIS — F3341 Major depressive disorder, recurrent, in partial remission: Secondary | ICD-10-CM | POA: Diagnosis not present

## 2024-01-08 DIAGNOSIS — M5030 Other cervical disc degeneration, unspecified cervical region: Secondary | ICD-10-CM | POA: Diagnosis not present

## 2024-01-10 DIAGNOSIS — E785 Hyperlipidemia, unspecified: Secondary | ICD-10-CM | POA: Diagnosis not present

## 2024-01-10 DIAGNOSIS — N400 Enlarged prostate without lower urinary tract symptoms: Secondary | ICD-10-CM | POA: Diagnosis not present

## 2024-01-10 DIAGNOSIS — F321 Major depressive disorder, single episode, moderate: Secondary | ICD-10-CM | POA: Diagnosis not present

## 2024-01-10 DIAGNOSIS — N529 Male erectile dysfunction, unspecified: Secondary | ICD-10-CM | POA: Diagnosis not present

## 2024-01-10 DIAGNOSIS — K219 Gastro-esophageal reflux disease without esophagitis: Secondary | ICD-10-CM | POA: Diagnosis not present

## 2024-01-10 DIAGNOSIS — I10 Essential (primary) hypertension: Secondary | ICD-10-CM | POA: Diagnosis not present

## 2024-01-10 DIAGNOSIS — Z8249 Family history of ischemic heart disease and other diseases of the circulatory system: Secondary | ICD-10-CM | POA: Diagnosis not present

## 2024-01-10 DIAGNOSIS — F411 Generalized anxiety disorder: Secondary | ICD-10-CM | POA: Diagnosis not present

## 2024-01-10 DIAGNOSIS — M4800 Spinal stenosis, site unspecified: Secondary | ICD-10-CM | POA: Diagnosis not present

## 2024-01-10 DIAGNOSIS — Z8546 Personal history of malignant neoplasm of prostate: Secondary | ICD-10-CM | POA: Diagnosis not present

## 2024-01-10 DIAGNOSIS — N3941 Urge incontinence: Secondary | ICD-10-CM | POA: Diagnosis not present

## 2024-01-10 DIAGNOSIS — Z7982 Long term (current) use of aspirin: Secondary | ICD-10-CM | POA: Diagnosis not present

## 2024-01-12 ENCOUNTER — Encounter: Payer: Self-pay | Admitting: Physical Therapy

## 2024-01-12 ENCOUNTER — Ambulatory Visit: Payer: Self-pay | Admitting: Physical Therapy

## 2024-01-12 ENCOUNTER — Telehealth: Payer: Self-pay | Admitting: Physical Therapy

## 2024-01-12 ENCOUNTER — Ambulatory Visit: Admitting: Physical Therapy

## 2024-01-12 DIAGNOSIS — M62838 Other muscle spasm: Secondary | ICD-10-CM | POA: Diagnosis not present

## 2024-01-12 DIAGNOSIS — R279 Unspecified lack of coordination: Secondary | ICD-10-CM | POA: Insufficient documentation

## 2024-01-12 DIAGNOSIS — R293 Abnormal posture: Secondary | ICD-10-CM | POA: Diagnosis not present

## 2024-01-12 DIAGNOSIS — M48061 Spinal stenosis, lumbar region without neurogenic claudication: Secondary | ICD-10-CM | POA: Diagnosis not present

## 2024-01-12 NOTE — Therapy (Signed)
 OUTPATIENT PHYSICAL THERAPY MALE PELVIC TREATMENT   Patient Name: [name] MRN: [id number] DOB:03/15/1955, 68 y.o., male Today's Date: 01/12/2024  END OF SESSION:  PT End of Session - 01/12/24 0928     Visit Number 3    Number of Visits 8    Date for Recertification  02/09/24    Authorization Type [employer] MCR ([employer] Approved 8 visits-12/15/2023-03/14/2024-auth#[id number])    Authorization - Visit Number 3    Authorization - Number of Visits 8    Progress Note Due on Visit 10    PT Start Time [phone]    PT Stop Time 0926    PT Time Calculation (min) 43 min    Activity Tolerance Patient tolerated treatment well    Behavior During Therapy WFL for tasks assessed/performed            Past Medical History:  Diagnosis Date   Anxiety    Arthritis    Cancer (HCC)    prostate cancer   Current smoker    Degenerative cervical disc    Depression    ED (erectile dysfunction)    Elevated PSA    Headache    HLD (hyperlipidemia)    Myocardial infarction (HCC)    PONV (postoperative nausea and vomiting)    Pre-diabetes    Prediabetes    Spinal stenosis    Past Surgical History:  Procedure Laterality Date   LAPAROSCOPIC TOTAL PELVIC LYMPHADENECTOMY N/A 12/03/2023   Procedure: LAPAROSCOPIC BILATERAL PELVIC LYMPHADENECTOMY;  Surgeon: Shane Steffan BROCKS, MD;  Location: [facility];  Service: Urology;  Laterality: N/A;   LEFT HEART CATH AND CORONARY ANGIOGRAPHY N/A 05/18/2023   Procedure: LEFT HEART CATH AND CORONARY ANGIOGRAPHY;  Surgeon: Mady Bruckner, MD;  Location: [facility];  Service: Cardiovascular;  Laterality: N/A;   MANDIBLE FRACTURE SURGERY     ROBOT ASSISTED LAPAROSCOPIC RADICAL PROSTATECTOMY N/A 12/03/2023   Procedure: ROBOTIC ASSISTED LAPAROSCOPIC RADICAL PROSTATECTOMY;  Surgeon: Shane Steffan BROCKS, MD;  Location: [facility];  Service: Urology;  Laterality: N/A;   SPLENECTOMY     UMBILICAL HERNIA REPAIR     Patient Active Problem List   Diagnosis Date Noted    Prostate cancer (HCC) 09/03/2023   Leukocytosis 06/05/2023   Night sweats 06/05/2023   Quit smoking in March 2025 06/05/2023   Spinal stenosis of lumbar region 08/27/2020   Impingement syndrome of right shoulder 12/24/2016   Chronic right shoulder pain 11/26/2016   Neck pain 11/04/2016   Chronic bilateral low back pain with bilateral sciatica 11/04/2016    PCP: Gerome Brunet, DO  REFERRING PROVIDER: Claudene Waddell HERO, PA-C   REFERRING DIAG: JOSH (ICD-10-CM) - Malignant neoplasm of prostate  THERAPY DIAG:  Other muscle spasm  Unspecified lack of coordination  Abnormal posture  Rationale for Evaluation and Treatment: Rehabilitation  ONSET DATE: 12/03/23  SUBJECTIVE:  SUBJECTIVE STATEMENT: Patient reports since last visit he was sick so he was not able to perform updated exercises a lot. When he does his exercises he is soaked afterwards. Sometimes it is hard to wait the 30 minute increments for his voiding schedule.  Eval: Patient reports that he had a prostatectomy and lymph node removal on September 25th, 2025. He is feeling sore in the surgical sites and heaviness in the pelvis/abdomen. He feels like he urinates constantly and is unable to control his urine.  Fluid intake: 4 cups of water  per day, sweet tea every once in a while, 2 cups of coffee per day  PAIN:  Are you having pain? No NPRS scale: 3-4/10 Pain location: Internal, Right, and Anterior  Pain type: aching and dull Pain description: intermittent   Aggravating factors: coughing  Relieving factors: pain relievers, tylenol    PRECAUTIONS: None  RED FLAGS: None and Bowel or bladder incontinence: Yes: secondary to prostatectomy    WEIGHT BEARING RESTRICTIONS: No  FALLS:  Has patient fallen in last 6 months? No  OCCUPATION:  retired   PLOF: Independent  PATIENT GOALS: to lessen the leakage, to strengthen the bladder musculature   PERTINENT HISTORY:  Spleen removal in 2015, hernia repair   BOWEL MOVEMENT: Pain with bowel movement: No Type of bowel movement:Type (Bristol Stool Scale) 4-6, Frequency 1-3, Strain tries not to , and Splinting no Fully empty rectum: No Leakage: No Pads: No Fiber supplement: Yes: miralax  if needed   URINATION: Pain with urination: No Fully empty bladder: No Stream: Strong Urgency: Yes:   Frequency: more frequently than in the past  Leakage: Urge to void, Walking to the bathroom, Coughing, Laughing, Lifting, and Bending forward and in the nighttime  Pads: Yes: 2 diapers and 5 pads per day   INTERCOURSE: not planning to return to sexual activity  OBJECTIVE:  Note: Objective measures were completed at Evaluation unless otherwise noted.  PATIENT SURVEYS:  PFIQ-7 65  COGNITION: Overall cognitive status: Within functional limits for tasks assessed     SENSATION: Light touch: Appears intact Proprioception: Appears intact  MUSCLE LENGTH: within normal limits for all motions tested bilaterally with no pain   LUMBAR SPECIAL TESTS:  Single leg stance test: Positive  FUNCTIONAL TESTS:  Squat: bilateral dynamic knee valgus with loading   GAIT: Assistive device utilized: None Level of assistance: Complete Independence Comments: moderate trendelenburg gait pattern with ambulation   POSTURE: rounded shoulders and forward head  PELVIC ALIGNMENT: within normal limits bilaterally   LUMBARAROM/PROM:  A/PROM A/PROM  eval  Flexion 75% available  Extension 75% available  Right lateral flexion WNL  Left lateral flexion WNL  Right rotation 75% available  Left rotation 75% available   (Blank rows = not tested)  LOWER EXTREMITY AROM/PROM: within normal limits for all motions tested bilaterally with no pain  LOWER EXTREMITY MMT: 3+/5 bilateral knees and hips  grossly  PALPATION: GENERAL no tenderness to palpation of abdominal scars secondary to prostatectomy               External exam: apical breathing pattern with decreased lower rib excursion during inhalation/exhalation              Internal Pelvic Floor not performed today due to time limitations  Patient confirms identification and approves PT to assess internal pelvic floor and treatment No  PELVIC MMT: not performed today due to time limitations    MMT eval  Internal Anal Sphincter   External Anal Sphincter  Puborectalis   Diastasis Recti 2 finger widths at umbilicus   (Blank rows = not tested)  TONE: High throughout abdominal region  TODAY'S TREATMENT:                                                                                                                              DATE:   EVAL 12/15/23: Examination completed, findings reviewed, pt educated on POC, HEP, and self care. Pt motivated to participate in PT and agreeable to attempt recommendations.   Water  intake recommendation: first thing in morning, last thing at night, ounces based on body weight number  Double voiding technique for emptying bladder when urinating  Bladder training schedule: every hour for 1 week, then increasing to every 1.5 hours, 2 hours, etc Hooklying diaphragmatic breathing + transverse abdominis activation on exhalation 2x10   12/30/23:  - Seated Diaphragmatic Breathing  - 1 x daily - 7 x weekly - 2 sets - 10 reps - Sit to Stand Without Arm Support  - 1 x daily - 7 x weekly - 2 sets - 10 reps - Supine Bridge with Mini Swiss Ball Between Knees  - 1 x daily - 7 x weekly - 2 sets - 10 reps - Clamshell  - 1 x daily - 7 x weekly - 2 sets - 10 reps - Sidelying Reverse Clamshell  - 1 x daily - 7 x weekly - 2 sets - 10 reps - Supine Butterfly Groin Stretch  - 1 x daily - 7 x weekly - 2 sets - hold - Sidelying Thoracic Rotation with Open Book  - 1 x daily - 7 x weekly - 2 sets - 10 reps - Manual to  abdominal scars from prostatectomy   01/12/2024 Supine butterfly stretch 2 x 1 mins Supine adductor stretch with green strap 2 x 20 sec bilateral  Supine bridge with purple ball 2 x 10 Open books x 10 each side Clamshell x 10 bilateral  Reverse clamshell x 10 bilateral  Sit to Stand x 10 Side stepping with light blue loop x 2 laps at counter Recumbent Bike Level 3 4 mins- PT present to discuss status     PATIENT EDUCATION:  Education details: Water  intake recommendation: first thing in morning, last thing at night, ounces based on body weight number; Double voiding technique for emptying bladder when urinating; Bladder training schedule: every hour for 1 week, then increasing to every 1.5 hours, 2 hours, etc Person educated: Patient Education method: Explanation, Demonstration, Tactile cues, Verbal cues, and Handouts Education comprehension: verbalized understanding, returned demonstration, verbal cues required, tactile cues required, and needs further education  HOME EXERCISE PROGRAM: Access Code: [email or web address] URL: [email or web address] Date: 12/30/2023 Prepared by: Celena Domino  Exercises - Seated Diaphragmatic Breathing  - 1 x daily - 7 x weekly - 2 sets - 10 reps - Sit to Stand Without Arm Support  - 1 x daily - 7 x weekly - 2 sets -  10 reps - Supine Bridge with Mini Swiss Ball Between Knees  - 1 x daily - 7 x weekly - 2 sets - 10 reps - Clamshell  - 1 x daily - 7 x weekly - 2 sets - 10 reps - Sidelying Reverse Clamshell  - 1 x daily - 7 x weekly - 2 sets - 10 reps - Supine Butterfly Groin Stretch  - 1 x daily - 7 x weekly - 2 sets - hold - Sidelying Thoracic Rotation with Open Book  - 1 x daily - 7 x weekly - 2 sets - 10 reps  ASSESSMENT:  CLINICAL IMPRESSION: Rajiv presents to skilled therapy with no complaints of pain or discomfort. He has been trying to stay on his 30 minute voiding schedule, but sometimes he does not make it to the 30 minute mark. Since  last visit he was under the weather some, so he has been semi-compliant to HEP. Reviewed exercises pelvic floor therapist administered last session. Patient required frequent verbal cues for correct breathing technique while performing exercises. Patient still has increased leakage with sit to stand transfers. Patient will benefit from skilled PT to address the below impairments and improve overall function.   OBJECTIVE IMPAIRMENTS: decreased coordination, decreased endurance, decreased mobility, decreased ROM, decreased strength, and pain.   ACTIVITY LIMITATIONS: carrying, lifting, bending, sitting, standing, squatting, stairs, transfers, bed mobility, continence, and toileting  PARTICIPATION LIMITATIONS: interpersonal relationship and community activity  PERSONAL FACTORS: Age, Past/current experiences, and Time since onset of injury/illness/exacerbation are also affecting patient's functional outcome.   REHAB POTENTIAL: Good  CLINICAL DECISION MAKING: Stable/uncomplicated  EVALUATION COMPLEXITY: Low   GOALS: Goals reviewed with patient? Yes  SHORT TERM GOALS: Target date: 01/12/2024  Pt will be independent with HEP.  Baseline: Goal status: INITIAL  2.  Pt will be independent with use of squatty potty, relaxed toileting mechanics, and improved bowel movement techniques in order to increase ease of bowel movements and complete evacuation.  Baseline:  Goal status: INITIAL  3.  Pt will be independent with the knack, urge suppression technique, and double voiding in order to improve bladder habits and decrease urinary incontinence.  Baseline:  Goal status: INITIAL  4.  Pt will report 50% reduction of pain due to improvements in posture, strength, and muscle length in abdomen and pelvis to improve quality of life.  Baseline:  Goal status: INITIAL  LONG TERM GOALS: Target date: 03/16/2024  Pt will be independent with advanced HEP.  Baseline:  Goal status: INITIAL  2.  Pt to  demonstrate improved coordination of pelvic floor and breathing mechanics with 10# squat with appropriate synergistic patterns to decrease pain and leakage at least 75% of the time for improved ability to complete a 30 minute workout with strain at pelvic floor and symptoms.   Baseline:  Goal status: INITIAL  3.  Pt will have to use less than 2 diapers per day to save on spending from purchasing diapers and to suggest improved urinary control/function.  Baseline:  Goal status: INITIAL  4.  Pt will be able to lift at least 10 lb correctly for 10 reps without pain or leakage for functional activities and to allow patient to perform yard/house work without urinary leakage being a limiting factor.  Baseline:  Goal status: INITIAL  PLAN:  PT FREQUENCY: 1-2x/week  PT DURATION: 6 months  PLANNED INTERVENTIONS: 97110-Therapeutic exercises, 97530- Therapeutic activity, [id number]- Neuromuscular re-education, 97535- Self Care, [id number]- Manual therapy, Patient/Family education, Balance training, Stair training, Taping,  Joint mobilization, Spinal mobilization, Scar mobilization, Cryotherapy, Moist heat, and Biofeedback  PLAN FOR NEXT SESSION: continued pelvic floor AROM in seated, scar site manual therapy, core strengthening and stretching, toileting mechanics for optimal emptying, urge drill   Kristeen Sar, PT, DPT 01/12/24 9:31 AM [facility] [address], [address] [place], [place] [address] Phone # [phone] Fax [phone]

## 2024-01-12 NOTE — Telephone Encounter (Signed)
 pt thought appt was scheduled for 8:45 this morning; moved to orthopedic slot at 8:45 with Ciera.  Celena Domino, PT, DPT 01/12/24 8:33 AM

## 2024-01-20 ENCOUNTER — Telehealth: Payer: Self-pay

## 2024-01-20 NOTE — Telephone Encounter (Signed)
 Followed up on voicemail from patient's wife asking about an oncology follow up. There was no additional information provided. I called and left a voicemail inviting patient and/or wife to call back and leave a more detailed message if I was not at my desk.

## 2024-01-21 NOTE — Telephone Encounter (Signed)
 Opened in error

## 2024-01-22 ENCOUNTER — Telehealth: Payer: Self-pay

## 2024-01-22 ENCOUNTER — Other Ambulatory Visit: Payer: Self-pay | Admitting: Oncology

## 2024-01-22 DIAGNOSIS — C61 Malignant neoplasm of prostate: Secondary | ICD-10-CM

## 2024-01-22 DIAGNOSIS — D72829 Elevated white blood cell count, unspecified: Secondary | ICD-10-CM

## 2024-01-22 NOTE — Telephone Encounter (Signed)
 Followed up on phone conversation taht took place late this morning with Patient's wife, Glenda. It was decided by Dr. Autumn that he will see patient as scheduled, on 02/10/2024, and discuss further management at that time. Glenda understood and agreed with plan.

## 2024-01-22 NOTE — Telephone Encounter (Signed)
 Glenda, the patient's wife, called to inquire about next appointment and if Dr. Autumn will be managing patient's Prostate Cancer treatment. Gaetana stated that she thought Dr. Autumn was not handling prostate patietns at this time. I told her [name] has an appt with Dr. Autumn on December 3rd including blood work and that he is an established patient. Dr. Autumn is aware and is making considerations for best plan for patient. Glenda agreed to wait to hear back from us  for the plan.

## 2024-01-28 ENCOUNTER — Ambulatory Visit: Admitting: Physical Therapy

## 2024-01-28 DIAGNOSIS — R279 Unspecified lack of coordination: Secondary | ICD-10-CM | POA: Diagnosis not present

## 2024-01-28 DIAGNOSIS — M62838 Other muscle spasm: Secondary | ICD-10-CM | POA: Diagnosis not present

## 2024-01-28 DIAGNOSIS — R293 Abnormal posture: Secondary | ICD-10-CM

## 2024-01-28 NOTE — Therapy (Signed)
 OUTPATIENT PHYSICAL THERAPY MALE PELVIC TREATMENT   Patient Name: [name] MRN: [id number] DOB:May 08, 1955, 68 y.o., male Today's Date: 01/28/2024  END OF SESSION:  PT End of Session - 01/28/24 1527     Visit Number 4    Number of Visits 8    Date for Recertification  02/09/24    Authorization Type [employer] MCR ([employer] Approved 8 visits-12/15/2023-03/14/2024-auth#[id number])    Authorization - Visit Number 4    Authorization - Number of Visits 8    Progress Note Due on Visit 10    PT Start Time 0245    PT Stop Time 0330    PT Time Calculation (min) 45 min    Activity Tolerance Patient tolerated treatment well    Behavior During Therapy WFL for tasks assessed/performed             Past Medical History:  Diagnosis Date   Anxiety    Arthritis    Cancer (HCC)    prostate cancer   Current smoker    Degenerative cervical disc    Depression    ED (erectile dysfunction)    Elevated PSA    Headache    HLD (hyperlipidemia)    Myocardial infarction (HCC)    PONV (postoperative nausea and vomiting)    Pre-diabetes    Prediabetes    Spinal stenosis    Past Surgical History:  Procedure Laterality Date   LAPAROSCOPIC TOTAL PELVIC LYMPHADENECTOMY N/A 12/03/2023   Procedure: LAPAROSCOPIC BILATERAL PELVIC LYMPHADENECTOMY;  Surgeon: Shane Steffan BROCKS, MD;  Location: [facility];  Service: Urology;  Laterality: N/A;   LEFT HEART CATH AND CORONARY ANGIOGRAPHY N/A 05/18/2023   Procedure: LEFT HEART CATH AND CORONARY ANGIOGRAPHY;  Surgeon: Mady Bruckner, MD;  Location: [facility];  Service: Cardiovascular;  Laterality: N/A;   MANDIBLE FRACTURE SURGERY     ROBOT ASSISTED LAPAROSCOPIC RADICAL PROSTATECTOMY N/A 12/03/2023   Procedure: ROBOTIC ASSISTED LAPAROSCOPIC RADICAL PROSTATECTOMY;  Surgeon: Shane Steffan BROCKS, MD;  Location: [facility];  Service: Urology;  Laterality: N/A;   SPLENECTOMY     UMBILICAL HERNIA REPAIR     Patient Active Problem List   Diagnosis Date Noted    Prostate cancer (HCC) 09/03/2023   Leukocytosis 06/05/2023   Night sweats 06/05/2023   Quit smoking in March 2025 06/05/2023   Spinal stenosis of lumbar region 08/27/2020   Impingement syndrome of right shoulder 12/24/2016   Chronic right shoulder pain 11/26/2016   Neck pain 11/04/2016   Chronic bilateral low back pain with bilateral sciatica 11/04/2016    PCP: Gerome Brunet, DO  REFERRING PROVIDER: Claudene Waddell HERO, PA-C   REFERRING DIAG: JOSH (ICD-10-CM) - Malignant neoplasm of prostate  THERAPY DIAG:  Other muscle spasm  Unspecified lack of coordination  Abnormal posture  Rationale for Evaluation and Treatment: Rehabilitation  ONSET DATE: 12/03/23  SUBJECTIVE:  SUBJECTIVE STATEMENT: He reports that he has been consistent with his voiding intervals at home, but this gets difficult when he is out and about in public or when moving around. He has been doing his exercises consistently, but will leak with them. He is getting up quite a  bit in the nighttime (8 or 9 times). No bowel issues to report.   Eval: Patient reports that he had a prostatectomy and lymph node removal on September 25th, 2025. He is feeling sore in the surgical sites and heaviness in the pelvis/abdomen. He feels like he urinates constantly and is unable to control his urine.  Fluid intake: 4 cups of water  per day, sweet tea every once in a while, 2 cups of coffee per day  PAIN:  Are you having pain? No NPRS scale: 3-4/10 Pain location: Internal, Right, and Anterior  Pain type: aching and dull Pain description: intermittent   Aggravating factors: coughing  Relieving factors: pain relievers, tylenol    PRECAUTIONS: None  RED FLAGS: None and Bowel or bladder incontinence: Yes: secondary to prostatectomy    WEIGHT  BEARING RESTRICTIONS: No  FALLS:  Has patient fallen in last 6 months? No  OCCUPATION: retired   PLOF: Independent  PATIENT GOALS: to lessen the leakage, to strengthen the bladder musculature   PERTINENT HISTORY:  Spleen removal in 2015, hernia repair   BOWEL MOVEMENT: Pain with bowel movement: No Type of bowel movement:Type (Bristol Stool Scale) 4-6, Frequency 1-3, Strain tries not to , and Splinting no Fully empty rectum: No Leakage: No Pads: No Fiber supplement: Yes: miralax  if needed   URINATION: Pain with urination: No Fully empty bladder: No Stream: Strong Urgency: Yes:   Frequency: more frequently than in the past  Leakage: Urge to void, Walking to the bathroom, Coughing, Laughing, Lifting, and Bending forward and in the nighttime  Pads: Yes: 2 diapers and 5 pads per day   INTERCOURSE: not planning to return to sexual activity  OBJECTIVE:  Note: Objective measures were completed at Evaluation unless otherwise noted.  PATIENT SURVEYS:  PFIQ-7 65  COGNITION: Overall cognitive status: Within functional limits for tasks assessed     SENSATION: Light touch: Appears intact Proprioception: Appears intact  MUSCLE LENGTH: within normal limits for all motions tested bilaterally with no pain   LUMBAR SPECIAL TESTS:  Single leg stance test: Positive  FUNCTIONAL TESTS:  Squat: bilateral dynamic knee valgus with loading   GAIT: Assistive device utilized: None Level of assistance: Complete Independence Comments: moderate trendelenburg gait pattern with ambulation   POSTURE: rounded shoulders and forward head  PELVIC ALIGNMENT: within normal limits bilaterally   LUMBARAROM/PROM:  A/PROM A/PROM  eval  Flexion 75% available  Extension 75% available  Right lateral flexion WNL  Left lateral flexion WNL  Right rotation 75% available  Left rotation 75% available   (Blank rows = not tested)  LOWER EXTREMITY AROM/PROM: within normal limits for all motions  tested bilaterally with no pain  LOWER EXTREMITY MMT: 3+/5 bilateral knees and hips grossly  PALPATION: GENERAL no tenderness to palpation of abdominal scars secondary to prostatectomy               External exam: apical breathing pattern with decreased lower rib excursion during inhalation/exhalation              Internal Pelvic Floor not performed today due to time limitations  Patient confirms identification and approves PT to assess internal pelvic floor and treatment No  PELVIC MMT: not performed today  due to time limitations    MMT eval  Internal Anal Sphincter   External Anal Sphincter   Puborectalis   Diastasis Recti 2 finger widths at umbilicus   (Blank rows = not tested)  TONE: High throughout abdominal region  TODAY'S TREATMENT:                                                                                                                              DATE:   EVAL 12/15/23: Examination completed, findings reviewed, pt educated on POC, HEP, and self care. Pt motivated to participate in PT and agreeable to attempt recommendations.   Water  intake recommendation: first thing in morning, last thing at night, ounces based on body weight number  Double voiding technique for emptying bladder when urinating  Bladder training schedule: every hour for 1 week, then increasing to every 1.5 hours, 2 hours, etc Hooklying diaphragmatic breathing + transverse abdominis activation on exhalation 2x10   12/30/23:  - Seated Diaphragmatic Breathing  - 1 x daily - 7 x weekly - 2 sets - 10 reps - Sit to Stand Without Arm Support  - 1 x daily - 7 x weekly - 2 sets - 10 reps - Supine Bridge with Mini Swiss Ball Between Knees  - 1 x daily - 7 x weekly - 2 sets - 10 reps - Clamshell  - 1 x daily - 7 x weekly - 2 sets - 10 reps - Sidelying Reverse Clamshell  - 1 x daily - 7 x weekly - 2 sets - 10 reps - Supine Butterfly Groin Stretch  - 1 x daily - 7 x weekly - 2 sets - hold - Sidelying  Thoracic Rotation with Open Book  - 1 x daily - 7 x weekly - 2 sets - 10 reps - Manual to abdominal scars from prostatectomy   01/12/2024 Supine butterfly stretch 2 x 1 mins Supine adductor stretch with green strap 2 x 20 sec bilateral  Supine bridge with purple ball 2 x 10 Open books x 10 each side Clamshell x 10 bilateral  Reverse clamshell x 10 bilateral  Sit to Stand x 10 Side stepping with light blue loop x 2 laps at counter Recumbent Bike Level 3 4 mins- PT present to discuss status  01/28/24: Internal rectal examination: Patient confirms identification and approves physical therapist to perform internal soft tissue work   Patient demonstrates a strong PF contraction in sidelying with strong coordination. Pt able to complete 10 quick flicks in rapid succession. No palpable trigger points in rectal canal, normal tone in puborectalis  Hooklying diaphragmatic breathing + pelvic floor lengthening with inhale + shortening with exhale 2x10  Hooklying pelvic floor quick flicks + diaphragmatic breathing 2x10  Knack technique for stress urinary incontinence management   PATIENT EDUCATION:  Education details: Water  intake recommendation: first thing in morning, last thing at night, ounces based on body weight number; Double voiding technique for emptying bladder when  urinating; Bladder training schedule: every hour for 1 week, then increasing to every 1.5 hours, 2 hours, etc Person educated: Patient Education method: Explanation, Demonstration, Tactile cues, Verbal cues, and Handouts Education comprehension: verbalized understanding, returned demonstration, verbal cues required, tactile cues required, and needs further education  HOME EXERCISE PROGRAM: Access Code: [email or web address] URL: [email or web address] Date: 01/28/2024 Prepared by: Celena Domino  Exercises - Sit to Stand Without Arm Support  - 1 x daily - 7 x weekly - 2 sets - 10 reps - Supine Bridge with Mini Swiss Ball  Between Knees  - 1 x daily - 7 x weekly - 2 sets - 10 reps - Clamshell  - 1 x daily - 7 x weekly - 2 sets - 10 reps - Sidelying Reverse Clamshell  - 1 x daily - 7 x weekly - 2 sets - 10 reps - Supine Butterfly Groin Stretch  - 1 x daily - 7 x weekly - 2 sets - hold - Sidelying Thoracic Rotation with Open Book  - 1 x daily - 7 x weekly - 2 sets - 10 reps - Seated Pelvic Floor Contraction  - 1 x daily - 7 x weekly - 2 sets - 10 reps - Quick Flick Pelvic Floor Contractions Seated    - 1 x daily - 7 x weekly - 2 sets - 10 reps  ASSESSMENT:  CLINICAL IMPRESSION: [name] presents to skilled therapy with no complaints of pain or discomfort. He has been trying to stay on his 30 minute voiding schedule, but sometimes he does not make it to the 30 minute mark - so we discussed a penile clamp for this issue and the couple purchased one during treatment session. Pt advised to use penile clamp in between voiding intervals to help him make it to the bathroom. With full consent from pt, rectal examination performed to reassess pelvic floor musculature. He has a stronger, much more coordinated contraction and is able to fully relax and contract the pelvic floor well. Exercises changed to reflect this today. Pt educated on pelvic floor endurance training also, tolerated well. No pain during today's exam. Patient required frequent verbal cues for correct breathing technique while performing exercises. Patient will benefit from skilled PT to address the below impairments and improve overall function.  OBJECTIVE IMPAIRMENTS: decreased coordination, decreased endurance, decreased mobility, decreased ROM, decreased strength, and pain.   ACTIVITY LIMITATIONS: carrying, lifting, bending, sitting, standing, squatting, stairs, transfers, bed mobility, continence, and toileting  PARTICIPATION LIMITATIONS: interpersonal relationship and community activity  PERSONAL FACTORS: Age, Past/current experiences, and Time since  onset of injury/illness/exacerbation are also affecting patient's functional outcome.   REHAB POTENTIAL: Good  CLINICAL DECISION MAKING: Stable/uncomplicated  EVALUATION COMPLEXITY: Low   GOALS: Goals reviewed with patient? Yes  SHORT TERM GOALS: Target date: 01/12/2024  Pt will be independent with HEP.  Baseline: Goal status: INITIAL  2.  Pt will be independent with use of squatty potty, relaxed toileting mechanics, and improved bowel movement techniques in order to increase ease of bowel movements and complete evacuation.  Baseline:  Goal status: INITIAL  3.  Pt will be independent with the knack, urge suppression technique, and double voiding in order to improve bladder habits and decrease urinary incontinence.  Baseline:  Goal status: INITIAL  4.  Pt will report 50% reduction of pain due to improvements in posture, strength, and muscle length in abdomen and pelvis to improve quality of life.  Baseline:  Goal status: INITIAL  LONG TERM GOALS: Target date:  03/16/2024  Pt will be independent with advanced HEP.  Baseline:  Goal status: INITIAL  2.  Pt to demonstrate improved coordination of pelvic floor and breathing mechanics with 10# squat with appropriate synergistic patterns to decrease pain and leakage at least 75% of the time for improved ability to complete a 30 minute workout with strain at pelvic floor and symptoms.   Baseline:  Goal status: INITIAL  3.  Pt will have to use less than 2 diapers per day to save on spending from purchasing diapers and to suggest improved urinary control/function.  Baseline:  Goal status: INITIAL  4.  Pt will be able to lift at least 10 lb correctly for 10 reps without pain or leakage for functional activities and to allow patient to perform yard/house work without urinary leakage being a limiting factor.  Baseline:  Goal status: INITIAL  PLAN:  PT FREQUENCY: 1-2x/week  PT DURATION: 6 months  PLANNED INTERVENTIONS:  97110-Therapeutic exercises, 97530- Therapeutic activity, 97112- Neuromuscular re-education, 97535- Self Care, [id number]- Manual therapy, Patient/Family education, Balance training, Stair training, Taping, Joint mobilization, Spinal mobilization, Scar mobilization, Cryotherapy, Moist heat, and Biofeedback  PLAN FOR NEXT SESSION: continued pelvic floor AROM in seated, scar site manual therapy, core strengthening and stretching, toileting mechanics for optimal emptying, urge drill   Celena Domino, PT, DPT 01/28/24 3:28 PM [facility] [address], [address] [place], [place] [address] Phone # [phone] Fax [phone]

## 2024-01-29 DIAGNOSIS — N3946 Mixed incontinence: Secondary | ICD-10-CM | POA: Diagnosis not present

## 2024-01-29 DIAGNOSIS — C61 Malignant neoplasm of prostate: Secondary | ICD-10-CM | POA: Diagnosis not present

## 2024-02-10 ENCOUNTER — Inpatient Hospital Stay: Attending: Oncology

## 2024-02-10 ENCOUNTER — Inpatient Hospital Stay: Admitting: Oncology

## 2024-02-10 VITALS — BP 116/79 | HR 60 | Temp 98.1°F | Resp 17 | Ht 69.0 in | Wt 179.4 lb

## 2024-02-10 DIAGNOSIS — Z87891 Personal history of nicotine dependence: Secondary | ICD-10-CM | POA: Insufficient documentation

## 2024-02-10 DIAGNOSIS — Z79899 Other long term (current) drug therapy: Secondary | ICD-10-CM | POA: Diagnosis not present

## 2024-02-10 DIAGNOSIS — C61 Malignant neoplasm of prostate: Secondary | ICD-10-CM | POA: Insufficient documentation

## 2024-02-10 DIAGNOSIS — Z9079 Acquired absence of other genital organ(s): Secondary | ICD-10-CM | POA: Diagnosis not present

## 2024-02-10 DIAGNOSIS — D72829 Elevated white blood cell count, unspecified: Secondary | ICD-10-CM | POA: Insufficient documentation

## 2024-02-10 LAB — CMP (CANCER CENTER ONLY)
ALT: 18 U/L (ref 0–44)
AST: 23 U/L (ref 15–41)
Albumin: 4.4 g/dL (ref 3.5–5.0)
Alkaline Phosphatase: 99 U/L (ref 38–126)
Anion gap: 9 (ref 5–15)
BUN: 17 mg/dL (ref 8–23)
CO2: 26 mmol/L (ref 22–32)
Calcium: 9.2 mg/dL (ref 8.9–10.3)
Chloride: 103 mmol/L (ref 98–111)
Creatinine: 0.75 mg/dL (ref 0.61–1.24)
GFR, Estimated: >60 mL/min (ref >60)
Glucose, Bld: 136 mg/dL — ABNORMAL HIGH (ref 70–99)
Potassium: 4.5 mmol/L (ref 3.5–5.1)
Sodium: 138 mmol/L (ref 135–145)
Total Bilirubin: <0.2 mg/dL (ref 0.0–1.2)
Total Protein: 8.1 g/dL (ref 6.5–8.1)

## 2024-02-10 LAB — CBC WITH DIFFERENTIAL (CANCER CENTER ONLY)
Abs Immature Granulocytes: 0.01 K/uL (ref 0.00–0.07)
Basophils Absolute: 0.1 K/uL (ref 0.0–0.1)
Basophils Relative: 2 %
Eosinophils Absolute: 0.4 K/uL (ref 0.0–0.5)
Eosinophils Relative: 7 %
HCT: 41.3 % (ref 39.0–52.0)
Hemoglobin: 13.4 g/dL (ref 13.0–17.0)
Immature Granulocytes: 0 %
Lymphocytes Relative: 34 %
Lymphs Abs: 2.1 K/uL (ref 0.7–4.0)
MCH: 28.6 pg (ref 26.0–34.0)
MCHC: 32.4 g/dL (ref 30.0–36.0)
MCV: 88.1 fL (ref 80.0–100.0)
Monocytes Absolute: 0.9 K/uL (ref 0.1–1.0)
Monocytes Relative: 15 %
Neutro Abs: 2.6 K/uL (ref 1.7–7.7)
Neutrophils Relative %: 42 %
Platelet Count: 357 K/uL (ref 150–400)
RBC: 4.69 MIL/uL (ref 4.22–5.81)
RDW: 18.6 % — ABNORMAL HIGH (ref 11.5–15.5)
WBC Count: 6.1 K/uL (ref 4.0–10.5)
nRBC: 0 % (ref 0.0–0.2)

## 2024-02-10 LAB — PSA: Prostatic Specific Antigen: 0.23 ng/mL (ref 0.00–4.00)

## 2024-02-10 LAB — LACTATE DEHYDROGENASE: LDH: 142 U/L (ref 105–235)

## 2024-02-10 NOTE — Progress Notes (Signed)
 [facility]  HEMATOLOGY/ONCOLOGY CLINIC PROGRESS NOTE  PATIENT NAME: [name]   MR#: [id number] DOB: [birth year]  Patient Care Team: Gerome Brunet, DO as PCP - General (Family Medicine) Kate Lonni CROME, MD as PCP - Cardiology (Cardiology)  Date of visit: 02/10/2024   ASSESSMENT & PLAN:   [name] is a 68 y.o.  pleasant gentleman with past medical history of hyperlipidemia, prediabetes, degenerative joint disease, previous tobacco smoking, was referred to our clinic in March 2025 for further evaluation of leukocytosis.  Workup negative for MPN.  Leukocytosis was felt to be reactionary from previous smoking.  Later he was diagnosed with prostate cancer, Gleason grade 4+3 = 7 in July 2025.  Underwent prostatectomy in September 2025.  Prostate cancer (HCC) PSA level increased from 8 to 9.4 in June 2025.  MRI of the pelvis on 09/08/2023 showed PI-RADS 4.  He underwent prostate biopsy on 09/30/2023, performed by Dr. Steffan Pea.  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  Perineural invasion identified.    PET PSMA scan on 10/19/2023 showed right-greater-than-left prostatic tracer affinity likely represents 1 or more primaries. A focus of mild tracer affinity in long the right internal iliac vasculature may correspond to a diminutive node and is suspicious for isolated nodal metastasis.  Unfavorable, intermediate risk prostate cancer with possible metastasis to the right iliac node location.  Dr. Pea discussed options of surgery versus radiation treatments.  Discussed the possible need for adjuvant radiation even if he were to pursue surgery and given the extent of the disease, Dr. Pea did not think he will be able to nerve spare.  Patient opted to proceed with surgery.    On 12/03/2023, he underwent robotic assisted laparoscopic radical prostatectomy (Non nerve-sparing) and bilateral pelvic lymph node dissection.  Final  pathology showed prostatic adenocarcinoma, Gleason score 4+3=7 (grade group 3).  Established extraprostatic extension is present at right posterolateral mid (pT3a).  Lymphovascular invasion was identified.  Seminal vesicles, vasa deferentia and all margins of resection were negative for tumor.  2 lymph nodes were removed and were benign.    pT3a, pN0,cM0, Stage III B disease.   High-risk features including extraprostatic extension and lymphovascular invasion.  Postoperative PSA 0.23 (today, 02/10/2024).  Plan is to proceed with external beam radiation therapy, pending healing from surgery.  If persistent PSA elevation is noted, could consider androgen depression therapy with Lupron, potentially 6 months to 2 years. Will defer this to Urology.   Continue physical therapy and pelvic floor exercises to address urinary incontinence. - Scheduled follow-up appointment in three months to assess progress and adjust treatment plan as necessary.  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 Current count is 11,200, slightly above normal.   Smoking cessation may reduce inflammation and leukocytosis.  After his consultation with us , labs on 06/08/2023 showed normal white count of 9400 with normal differential.  Hemoglobin normal at 15.4, platelet count normal at 370,000.  CMP was unremarkable except for glucose 166.  LDH, sed rate, CRP, TSH, iron studies were all within normal limits.  BCR/ABL 1 was negative.  JAK2 mutation analysis with reflex testing to include CALR, MPL, exon 12-15 mutations were all negative.  Flow cytometry of peripheral blood was also unremarkable.  Repeat labs today also showed normal white count of 6100 with normal differential.  Hemoglobin and platelet count are within normal limits.   Clinical picture is not indicative of myeloproliferative neoplasm.  Since  patient quit smoking, we expect white count to remain  stable.  Originally plan was to see him in 1 year for follow-up.  However given diagnosis of prostate cancer, we will see him sooner in clinic to follow-up on developments and any additional adjuvant treatments that he may need.   I spent a total of 42 minutes during this encounter with the patient including review of chart and various tests results, discussions about plan of care and coordination of care plan.  I reviewed lab results and outside records for this visit and discussed relevant results with the patient. Diagnosis, plan of care and treatment options were also discussed in detail with the patient. Opportunity provided to ask questions and answers provided to his apparent satisfaction. Provided instructions to call our clinic with any problems, questions or concerns prior to return visit. I recommended to continue follow-up with PCP and sub-specialists. He verbalized understanding and agreed with the plan. No barriers to learning was detected.  Chinita Patten, MD  02/10/2024 12:18 PM  [facility] - A DEPT OF [facility] [address] [place] [address] Dept: [phone] Dept Fax: [phone]   CHIEF COMPLAINT/ REASON FOR VISIT:  Follow-up for history of leukocytosis.  Negative hematological workup.  Likely reactionary to previous tobacco use.  He quit smoking in March 2025.  Diagnosed with prostate cancer, Gleason grade 3+4=7 in July 2025.  INTERVAL HISTORY:  Discussed the use of AI scribe software for clinical note transcription with the patient, who gave verbal consent to proceed.  History of Present Illness  [name] is a 68 year old male with stage three prostate cancer who presents with urinary incontinence. He was referred by Dr. Shane for evaluation of prostate cancer and urinary incontinence.  He is experiencing significant urinary incontinence, described as 'really bad,' without any  bowel incontinence. He is currently undergoing pelvic floor exercises as part of his physical therapy regimen. No pain or burning during urination is reported.  The cancer was surgically removed, and lymph nodes were negative. A PSA level of 0.2 was noted, and a new PSA test was conducted today.  His appetite is good, and he denies nausea or vomiting. He is not smoking. His blood counts, including white count and platelets, are normal, and previous mild anemia has resolved. Kidney and liver function tests are normal.   SUMMARY OF HEMATOLOGIC HISTORY:  On 05/15/2023, labs showed white count of 19,600.  No differential was obtained.  Hemoglobin and platelet count were within normal limits.  He was referred to us  for further evaluation of leukocytosis.   On review of records, he has had chronic, intermittent leukocytosis with white count ranging anywhere between 13,000 to 19,000 since 2018 at least.   He denies recent infection. The last prescription antibiotics was more than 3 months ago.    Patient denies sinus congestion, cough, urinary frequency/urgency or dysuria, diarrhea, joint swelling/pain or abnormal skin rash.    The patient reports experiencing severe night sweats for the past seven years, which are so intense that he often requires changing clothes and sheets. The patient denies any fever and reports a good appetite. The patient quit smoking four weeks ago upon medical advice. The patient denies any history of steroid use.   He had no prior history or diagnosis of cancer. His age appropriate screening programs are up-to-date.   The patient has no prior diagnosis of autoimmune disease and was not  prescribed corticosteroids related products.   Smoking cessation may reduce inflammation and leukocytosis.   After his consultation with us , labs on 06/08/2023 showed normal white count of 9400 with normal differential.  Hemoglobin normal at 15.4, platelet count normal at 370,000.  CMP was  unremarkable except for glucose 166.  LDH, sed rate, CRP, TSH, iron studies were all within normal limits.  BCR/ABL 1 was negative.  JAK2 mutation analysis with reflex testing to include CALR, MPL, exon 12-15 mutations were all negative.  Flow cytometry of peripheral blood was also unremarkable.   Clinical picture is not indicative of myeloproliferative neoplasm.  Since patient quit smoking, we expect white count to remain stable.   ONCOLOGY HISTORY:  PSA level increased from 8 to 9.4 in June 2025.  MRI of the pelvis on 09/08/2023 showed PI-RADS 4.  He underwent prostate biopsy on 09/30/2023, performed by Dr. Steffan Pea.  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  Perineural invasion identified.    PET PSMA scan on 10/19/2023 showed right-greater-than-left prostatic tracer affinity likely represents 1 or more primaries. A focus of mild tracer affinity in long the right internal iliac vasculature may correspond to a diminutive node and is suspicious for isolated nodal metastasis.  Unfavorable, intermediate risk prostate cancer with possible metastasis to the right iliac node location.  Dr. Pea discussed options of surgery versus radiation treatments.  Discussed the possible need for adjuvant radiation even if he were to pursue surgery and given the extent of the disease, Dr. Pea did not think he will be able to nerve spare.  Patient opted to proceed with surgery.   On 12/03/2023, he underwent robotic assisted laparoscopic radical prostatectomy (Non nerve-sparing) and bilateral pelvic lymph node dissection.  Final pathology showed prostatic adenocarcinoma, Gleason score 4+3=7 (grade group 3).  Established extraprostatic extension is present at right posterolateral mid (pT3a).  Lymphovascular invasion was identified.  Seminal vesicles, vasa deferentia and all margins of resection were negative for tumor.  2 lymph nodes were removed and were benign.    pT3a,  pN0,cM0, Stage III B disease.   High-risk features including extraprostatic extension and lymphovascular invasion.  Postoperative PSA 0.23.  Plan is to proceed with external beam radiation therapy, pending healing from surgery.  Oncology History  Prostate cancer (HCC)  09/03/2023 Initial Diagnosis   Prostate cancer (HCC)   02/10/2024 Cancer Staging   Staging form: Prostate, AJCC 8th Edition - Pathologic: Stage IIIB (pT3a, pN0, cM0, PSA: 9.5, Grade Group: 3) - Signed by Autumn Millman, MD on 02/10/2024 Prostate specific antigen (PSA) range: Less than 10 Gleason score: 7 Histologic grading system: 5 grade system Residual tumor (R): R0     I have reviewed the past medical history, past surgical history, social history and family history with the patient and they are unchanged from previous note.  ALLERGIES: He has no known allergies.  MEDICATIONS:  Current Outpatient Medications  Medication Sig Dispense Refill   Cholecalciferol (VITAMIN D3) 25 MCG (1000 UT) CAPS Take 1 capsule by mouth daily.     dextromethorphan-guaiFENesin (MUCINEX DM) 30-600 MG 12hr tablet Take 1 tablet by mouth 2 (two) times daily.     diazepam  (VALIUM ) 10 MG tablet Take 10 mg by mouth at bedtime.     DULoxetine  (CYMBALTA ) 60 MG capsule Take 60 mg by mouth every evening.     ezetimibe  (ZETIA ) 10 MG tablet Take 1 tablet (10 mg total) by mouth daily. (Patient taking differently: Take 10 mg by mouth at bedtime.) 90 tablet  3   Ferrous Sulfate (IRON) 28 MG TABS Take 1 tablet by mouth 3 (three) times a week.     HYDROcodone -acetaminophen  (NORCO/VICODIN) 5-325 MG tablet Take 1-2 tablets by mouth every 6 (six) hours as needed for moderate pain (pain score 4-6) or severe pain (pain score 7-10). 20 tablet 0   methocarbamol  (ROBAXIN ) 750 MG tablet Take 750 mg by mouth every 8 (eight) hours as needed for muscle spasms.     metoprolol  succinate (TOPROL  XL) 25 MG 24 hr tablet Take 0.5 tablets (12.5 mg total) by mouth daily. 30  tablet 5   Multiple Vitamin (MULTI VITAMIN) TABS Take 1 tablet by mouth daily.     pantoprazole  (PROTONIX ) 40 MG tablet Take 40 mg by mouth at bedtime.     rosuvastatin  (CRESTOR ) 20 MG tablet TAKE 1 TABLET BY MOUTH AT BEDTIME 90 tablet 1   sertraline  (ZOLOFT ) 100 MG tablet Take 200 mg by mouth at bedtime.  0   tolterodine (DETROL LA) 4 MG 24 hr capsule Take 4 mg by mouth daily.     traMADol  (ULTRAM ) 50 MG tablet 2 tablets as needed Orally every 6 hours; Duration: 7 days As needed (M51.360)     polyethylene glycol powder (MIRALAX ) 17 GM/SCOOP powder Take 17 g by mouth daily. Dissolve 1 capful (17g) in 4-8 ounces of liquid and take by mouth daily. (Patient not taking: Reported on 02/10/2024) 238 g 0   sulfamethoxazole -trimethoprim  (BACTRIM  DS) 800-160 MG tablet Take 1 tablet by mouth 2 (two) times daily. Start the day prior to foley removal appointment (Patient not taking: Reported on 02/10/2024) 6 tablet 0   No current facility-administered medications for this visit.     REVIEW OF SYSTEMS:    Review of Systems - Oncology  All other pertinent systems were reviewed with the patient and are negative.  PHYSICAL EXAMINATION:    Onc Performance Status - 02/10/24 1149       ECOG Perf Status   ECOG Perf Status Restricted in physically strenuous activity but ambulatory and able to carry out work of a light or sedentary nature, e.g., light house work, office work      KPS SCALE   KPS % SCORE Cares for self, unable to carry on normal activity or to do active work            Vitals:   02/10/24 1148  BP: 116/79  Pulse: 60  Resp: 17  Temp: 98.1 F (36.7 C)  SpO2: 96%     Filed Weights   02/10/24 1148  Weight: 179 lb 6.4 oz (81.4 kg)      Physical Exam Constitutional:      General: He is not in acute distress.    Appearance: Normal appearance.  HENT:     Head: Normocephalic and atraumatic.  Eyes:     Conjunctiva/sclera: Conjunctivae normal.  Cardiovascular:     Rate  and Rhythm: Normal rate and regular rhythm.  Pulmonary:     Effort: Pulmonary effort is normal. No respiratory distress.  Abdominal:     General: There is no distension.  Neurological:     General: No focal deficit present.     Mental Status: He is alert and oriented to person, place, and time.  Psychiatric:        Mood and Affect: Mood normal.        Behavior: Behavior normal.     LABORATORY DATA:   I have reviewed the data as listed.  Results for orders placed  or performed in visit on 02/10/24  PSA (For [facility])  Result Value Ref Range   Prostatic Specific Antigen 0.23 0.00 - 4.00 ng/mL  Lactate dehydrogenase  Result Value Ref Range   LDH 142 105 - 235 U/L  CMP (Cancer Center only)  Result Value Ref Range   Sodium 138 135 - 145 mmol/L   Potassium 4.5 3.5 - 5.1 mmol/L   Chloride 103 98 - 111 mmol/L   CO2 26 22 - 32 mmol/L   Glucose, Bld 136 (H) 70 - 99 mg/dL   BUN 17 8 - 23 mg/dL   Creatinine 9.24 9.38 - 1.24 mg/dL   Calcium  9.2 8.9 - 10.3 mg/dL   Total Protein 8.1 6.5 - 8.1 g/dL   Albumin  4.4 3.5 - 5.0 g/dL   AST 23 15 - 41 U/L   ALT 18 0 - 44 U/L   Alkaline Phosphatase 99 38 - 126 U/L   Total Bilirubin <0.2 0.0 - 1.2 mg/dL   GFR, Estimated >39 >39 mL/min   Anion gap 9 5 - 15  CBC with Differential (Cancer Center Only)  Result Value Ref Range   WBC Count 6.1 4.0 - 10.5 K/uL   RBC 4.69 4.22 - 5.81 MIL/uL   Hemoglobin 13.4 13.0 - 17.0 g/dL   HCT 58.6 60.9 - 47.9 %   MCV 88.1 80.0 - 100.0 fL   MCH 28.6 26.0 - 34.0 pg   MCHC 32.4 30.0 - 36.0 g/dL   RDW 81.3 (H) 88.4 - 84.4 %   Platelet Count 357 150 - 400 K/uL   nRBC 0.0 0.0 - 0.2 %   Neutrophils Relative % 42 %   Neutro Abs 2.6 1.7 - 7.7 K/uL   Lymphocytes Relative 34 %   Lymphs Abs 2.1 0.7 - 4.0 K/uL   Monocytes Relative 15 %   Monocytes Absolute 0.9 0.1 - 1.0 K/uL   Eosinophils Relative 7 %   Eosinophils Absolute 0.4 0.0 - 0.5 K/uL   Basophils Relative 2 %   Basophils Absolute 0.1 0.0 - 0.1 K/uL    Immature Granulocytes 0 %   Abs Immature Granulocytes 0.01 0.00 - 0.07 K/uL     RADIOGRAPHIC STUDIES:  No recent pertinent imaging studies available to review.  Orders Placed This Encounter  Procedures   CBC with Differential (Cancer Center Only)    Standing Status:   Future    Expiration Date:   02/09/2025     Future Appointments  Date Time Provider Department Center  02/25/2024  2:00 PM Gretel Kendall C, PT [facility] None  03/08/2024  2:00 PM Price, Keely C, PT [facility] None  05/11/2024  3:15 PM [facility] None  05/11/2024  3:45 PM Cj Edgell, Chinita, MD [facility] None    This document was completed utilizing speech recognition software. Grammatical errors, random word insertions, pronoun errors, and incomplete sentences are an occasional consequence of this system due to software limitations, ambient noise, and hardware issues. Any formal questions or concerns about the content, text or information contained within the body of this dictation should be directly addressed to the provider for clarification.

## 2024-02-10 NOTE — Assessment & Plan Note (Signed)
 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 Current count is 11,200, slightly above normal.   Smoking cessation may reduce inflammation and leukocytosis.  After his consultation with us , labs on 06/08/2023 showed normal white count of 9400 with normal differential.  Hemoglobin normal at 15.4, platelet count normal at 370,000.  CMP was unremarkable except for glucose 166.  LDH, sed rate, CRP, TSH, iron studies were all within normal limits.  BCR/ABL 1 was negative.  JAK2 mutation analysis with reflex testing to include CALR, MPL, exon 12-15 mutations were all negative.  Flow cytometry of peripheral blood was also unremarkable.  Repeat labs today also showed normal white count of 6100 with normal differential.  Hemoglobin and platelet count are within normal limits.   Clinical picture is not indicative of myeloproliferative neoplasm.  Since patient quit smoking, we expect white count to remain stable.  Originally plan was to see him in 1 year for follow-up.  However given diagnosis of prostate cancer, we will see him sooner in clinic to follow-up on developments and any additional adjuvant treatments that he may need.

## 2024-02-10 NOTE — Assessment & Plan Note (Addendum)
 PSA level increased from 8 to 9.4 in June 2025.  MRI of the pelvis on 09/08/2023 showed PI-RADS 4.  He underwent prostate biopsy on 09/30/2023, performed by Dr. Steffan Pea.  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  Perineural invasion identified.    PET PSMA scan on 10/19/2023 showed right-greater-than-left prostatic tracer affinity likely represents 1 or more primaries. A focus of mild tracer affinity in long the right internal iliac vasculature may correspond to a diminutive node and is suspicious for isolated nodal metastasis.  Unfavorable, intermediate risk prostate cancer with possible metastasis to the right iliac node location.  Dr. Pea discussed options of surgery versus radiation treatments.  Discussed the possible need for adjuvant radiation even if he were to pursue surgery and given the extent of the disease, Dr. Pea did not think he will be able to nerve spare.  Patient opted to proceed with surgery.    On 12/03/2023, he underwent robotic assisted laparoscopic radical prostatectomy (Non nerve-sparing) and bilateral pelvic lymph node dissection.  Final pathology showed prostatic adenocarcinoma, Gleason score 4+3=7 (grade group 3).  Established extraprostatic extension is present at right posterolateral mid (pT3a).  Lymphovascular invasion was identified.  Seminal vesicles, vasa deferentia and all margins of resection were negative for tumor.  2 lymph nodes were removed and were benign.    pT3a, pN0,cM0, Stage III B disease.   High-risk features including extraprostatic extension and lymphovascular invasion.  Postoperative PSA 0.23 (today, 02/10/2024).  Plan is to proceed with external beam radiation therapy, pending healing from surgery.  If persistent PSA elevation is noted, could consider androgen depression therapy with Lupron, potentially 6 months to 2 years. Will defer this to Urology.   Continue physical therapy and pelvic floor exercises  to address urinary incontinence. - Scheduled follow-up appointment in three months to assess progress and adjust treatment plan as necessary.

## 2024-02-11 ENCOUNTER — Ambulatory Visit: Admitting: Physical Therapy

## 2024-02-16 ENCOUNTER — Encounter: Payer: Self-pay | Admitting: Oncology

## 2024-02-18 ENCOUNTER — Ambulatory Visit: Admitting: Physical Therapy

## 2024-02-18 DIAGNOSIS — M62838 Other muscle spasm: Secondary | ICD-10-CM | POA: Insufficient documentation

## 2024-02-18 DIAGNOSIS — R279 Unspecified lack of coordination: Secondary | ICD-10-CM

## 2024-02-18 DIAGNOSIS — R293 Abnormal posture: Secondary | ICD-10-CM | POA: Insufficient documentation

## 2024-02-18 NOTE — Therapy (Signed)
 OUTPATIENT PHYSICAL THERAPY MALE PELVIC TREATMENT   Patient Name: [name] MRN: [id number] DOB:September 25, 1955, 68 y.o., male Today's Date: 02/18/2024  END OF SESSION:  PT End of Session - 02/18/24 1432     Visit Number 5    Number of Visits 8    Date for Recertification  02/09/24    Authorization Type [employer] MCR ([employer] Approved 8 visits-12/15/2023-03/14/2024-auth#[id number])    Authorization - Visit Number 5    Authorization - Number of Visits 8    Progress Note Due on Visit 10    PT Start Time 0200    PT Stop Time 0240    PT Time Calculation (min) 40 min    Activity Tolerance Patient tolerated treatment well    Behavior During Therapy WFL for tasks assessed/performed              Past Medical History:  Diagnosis Date   Anxiety    Arthritis    Cancer (HCC)    prostate cancer   Current smoker    Degenerative cervical disc    Depression    ED (erectile dysfunction)    Elevated PSA    Headache    HLD (hyperlipidemia)    Myocardial infarction (HCC)    PONV (postoperative nausea and vomiting)    Pre-diabetes    Prediabetes    Spinal stenosis    Past Surgical History:  Procedure Laterality Date   LAPAROSCOPIC TOTAL PELVIC LYMPHADENECTOMY N/A 12/03/2023   Procedure: LAPAROSCOPIC BILATERAL PELVIC LYMPHADENECTOMY;  Surgeon: Shane Steffan BROCKS, MD;  Location: [facility];  Service: Urology;  Laterality: N/A;   LEFT HEART CATH AND CORONARY ANGIOGRAPHY N/A 05/18/2023   Procedure: LEFT HEART CATH AND CORONARY ANGIOGRAPHY;  Surgeon: Mady Bruckner, MD;  Location: [facility];  Service: Cardiovascular;  Laterality: N/A;   MANDIBLE FRACTURE SURGERY     ROBOT ASSISTED LAPAROSCOPIC RADICAL PROSTATECTOMY N/A 12/03/2023   Procedure: ROBOTIC ASSISTED LAPAROSCOPIC RADICAL PROSTATECTOMY;  Surgeon: Shane Steffan BROCKS, MD;  Location: [facility];  Service: Urology;  Laterality: N/A;   SPLENECTOMY     UMBILICAL HERNIA REPAIR     Patient Active Problem List   Diagnosis Date Noted    Prostate cancer (HCC) 09/03/2023   Leukocytosis 06/05/2023   Night sweats 06/05/2023   Quit smoking in March 2025 06/05/2023   Spinal stenosis of lumbar region 08/27/2020   Impingement syndrome of right shoulder 12/24/2016   Chronic right shoulder pain 11/26/2016   Neck pain 11/04/2016   Chronic bilateral low back pain with bilateral sciatica 11/04/2016    PCP: Gerome Brunet, DO  REFERRING PROVIDER: Claudene Waddell HERO, PA-C   REFERRING DIAG: JOSH (ICD-10-CM) - Malignant neoplasm of prostate  THERAPY DIAG:  Other muscle spasm  Unspecified lack of coordination  Abnormal posture  Rationale for Evaluation and Treatment: Rehabilitation  ONSET DATE: 12/03/23  SUBJECTIVE:  SUBJECTIVE STATEMENT: Patient reports that he is doing well. He has been going to void every hour - when he is active, he can go every 30 minutes. He has been getting up 2-3 times in the night which is much better. No bowel issues to report. He has been consistent with HEP - he is still leaking with exercises but the leakage is small in amount.   Eval: Patient reports that he had a prostatectomy and lymph node removal on September 25th, 2025. He is feeling sore in the surgical sites and heaviness in the pelvis/abdomen. He feels like he urinates constantly and is unable to control his urine.  Fluid intake: 4 cups of water  per day, sweet tea every once in a while, 2 cups of coffee per day  PAIN:  Are you having pain? No NPRS scale: 3-4/10 Pain location: Internal, Right, and Anterior  Pain type: aching and dull Pain description: intermittent   Aggravating factors: coughing  Relieving factors: pain relievers, tylenol    PRECAUTIONS: None  RED FLAGS: None and Bowel or bladder incontinence: Yes: secondary to prostatectomy     WEIGHT BEARING RESTRICTIONS: No  FALLS:  Has patient fallen in last 6 months? No  OCCUPATION: retired   PLOF: Independent  PATIENT GOALS: to lessen the leakage, to strengthen the bladder musculature   PERTINENT HISTORY:  Spleen removal in 2015, hernia repair   BOWEL MOVEMENT: Pain with bowel movement: No Type of bowel movement:Type (Bristol Stool Scale) 4-6, Frequency 1-3, Strain tries not to , and Splinting no Fully empty rectum: No Leakage: No Pads: No Fiber supplement: Yes: miralax  if needed   URINATION: Pain with urination: No Fully empty bladder: No Stream: Strong Urgency: Yes:   Frequency: more frequently than in the past  Leakage: Urge to void, Walking to the bathroom, Coughing, Laughing, Lifting, and Bending forward and in the nighttime  Pads: Yes: 2 diapers and 5 pads per day   INTERCOURSE: not planning to return to sexual activity  OBJECTIVE:  Note: Objective measures were completed at Evaluation unless otherwise noted.  PATIENT SURVEYS:  PFIQ-7 65  COGNITION: Overall cognitive status: Within functional limits for tasks assessed     SENSATION: Light touch: Appears intact Proprioception: Appears intact  MUSCLE LENGTH: within normal limits for all motions tested bilaterally with no pain   LUMBAR SPECIAL TESTS:  Single leg stance test: Positive  FUNCTIONAL TESTS:  Squat: bilateral dynamic knee valgus with loading   GAIT: Assistive device utilized: None Level of assistance: Complete Independence Comments: moderate trendelenburg gait pattern with ambulation   POSTURE: rounded shoulders and forward head  PELVIC ALIGNMENT: within normal limits bilaterally   LUMBARAROM/PROM:  A/PROM A/PROM  eval  Flexion 75% available  Extension 75% available  Right lateral flexion WNL  Left lateral flexion WNL  Right rotation 75% available  Left rotation 75% available   (Blank rows = not tested)  LOWER EXTREMITY AROM/PROM: within normal limits for all  motions tested bilaterally with no pain  LOWER EXTREMITY MMT: 3+/5 bilateral knees and hips grossly  PALPATION: GENERAL no tenderness to palpation of abdominal scars secondary to prostatectomy               External exam: apical breathing pattern with decreased lower rib excursion during inhalation/exhalation              Internal Pelvic Floor not performed today due to time limitations  Patient confirms identification and approves PT to assess internal pelvic floor and treatment No  PELVIC  MMT: not performed today due to time limitations    MMT eval  Internal Anal Sphincter   External Anal Sphincter   Puborectalis   Diastasis Recti 2 finger widths at umbilicus   (Blank rows = not tested)  TONE: High throughout abdominal region  TODAY'S TREATMENT:                                                                                                                              DATE:   01/12/2024 Supine butterfly stretch 2 x 1 mins Supine adductor stretch with green strap 2 x 20 sec bilateral  Supine bridge with purple ball 2 x 10 Open books x 10 each side Clamshell x 10 bilateral  Reverse clamshell x 10 bilateral  Sit to Stand x 10 Side stepping with light blue loop x 2 laps at counter Recumbent Bike Level 3 4 mins- PT present to discuss status  01/28/24: Internal rectal examination: Patient confirms identification and approves physical therapist to perform internal soft tissue work   Patient demonstrates a strong PF contraction in sidelying with strong coordination. Pt able to complete 10 quick flicks in rapid succession. No palpable trigger points in rectal canal, normal tone in puborectalis  Hooklying diaphragmatic breathing + pelvic floor lengthening with inhale + shortening with exhale 2x10  Hooklying pelvic floor quick flicks + diaphragmatic breathing 2x10  Knack technique for stress urinary incontinence management   02/18/24: Seated pelvic floor contraction +  diaphragmatic breathing 2x44min  Seated quick flick pelvic floor contractions + diaphragmatic breathing x15  Standing pelvic floor contraction + diaphragmatic breathing 2x10  Standing quick flick pelvic floor contractions + diaphragmatic breathing x15  Sit to stand + diaphragmatic breathing x10  Sit to stand + adductor ball squeeze + diaphragmatic breathing x10  Sit to stand + adductor ball squeeze + pelvic floor contraction + diaphragmatic breathing 2x10  Bridge + adductor ball squeeze + diaphragmatic breathing 2x53min  Seated adductor ball squeeze + diaphragmatic breathing 2x35min  Manual to abdominal scars to promote blood flow to abdomen and decrease scar tissue restriction in abdomen Deep diaphragmatic breathing while cups are in place   PATIENT EDUCATION:  Education details: Water  intake recommendation: first thing in morning, last thing at night, ounces based on body weight number; Double voiding technique for emptying bladder when urinating; Bladder training schedule: every hour for 1 week, then increasing to every 1.5 hours, 2 hours, etc Person educated: Patient Education method: Explanation, Demonstration, Tactile cues, Verbal cues, and Handouts Education comprehension: verbalized understanding, returned demonstration, verbal cues required, tactile cues required, and needs further education  HOME EXERCISE PROGRAM: Access Code: [email or web address] URL: [email or web address] Date: 02/18/2024 Prepared by: Celena Domino  Exercises - Seated Pelvic Floor Contraction  - 1 x daily - 7 x weekly - 2 sets - 10 reps - Standing Pelvic Floor Contraction  - 1 x daily - 7 x weekly - 1 sets - 10 reps - Quick Flick Pelvic Floor Contractions  Seated    - 1 x daily - 7 x weekly - 2 sets - 10 reps - Seated Hip Adduction Isometrics with Ball  - 1 x daily - 7 x weekly - 2 sets - 10 reps - Sit to Stand with Mercer Between Knees  - 1 x daily - 7 x weekly - 2 sets - 10 reps - Supine Bridge with Mini Swiss  Ball Between Knees  - 1 x daily - 7 x weekly - 2 sets - 10 reps - Clamshell  - 1 x daily - 7 x weekly - 2 sets - 10 reps - Sidelying Reverse Clamshell  - 1 x daily - 7 x weekly - 2 sets - 10 reps - Supine Butterfly Groin Stretch  - 1 x daily - 7 x weekly - 2 sets - hold - Sidelying Thoracic Rotation with Open Book  - 1 x daily - 7 x weekly - 2 sets - 10 reps  ASSESSMENT:  CLINICAL IMPRESSION: Zaccai presents to skilled therapy with no complaints of pain or discomfort. He has been trying to stay on his 30 minute voiding schedule - he is slowly working towards the 1 hour voiding interval goal. He purchased a penile clamp and is wearing in between voids. HEP has been easier to achieve since last visit. Exercises progressed in intensity today with addition of gravitational load, amount of repetitions, and addition of ball to sit to stand exercise. Pt advised to continue voiding intervals as he can. Manual therapy to abdominal scars revealed increased tension at central linea alba scar compared to others in abdominal wall. Patient required frequent verbal cues for correct breathing technique while performing exercises. Patient will benefit from skilled PT to address the below impairments and improve overall function.  OBJECTIVE IMPAIRMENTS: decreased coordination, decreased endurance, decreased mobility, decreased ROM, decreased strength, and pain.   ACTIVITY LIMITATIONS: carrying, lifting, bending, sitting, standing, squatting, stairs, transfers, bed mobility, continence, and toileting  PARTICIPATION LIMITATIONS: interpersonal relationship and community activity  PERSONAL FACTORS: Age, Past/current experiences, and Time since onset of injury/illness/exacerbation are also affecting patient's functional outcome.   REHAB POTENTIAL: Good  CLINICAL DECISION MAKING: Stable/uncomplicated  EVALUATION COMPLEXITY: Low   GOALS: Goals reviewed with patient? Yes  SHORT TERM GOALS: Target date:  01/12/2024  Pt will be independent with HEP.  Baseline: Goal status: INITIAL  2.  Pt will be independent with use of squatty potty, relaxed toileting mechanics, and improved bowel movement techniques in order to increase ease of bowel movements and complete evacuation.  Baseline:  Goal status: INITIAL  3.  Pt will be independent with the knack, urge suppression technique, and double voiding in order to improve bladder habits and decrease urinary incontinence.  Baseline:  Goal status: INITIAL  4.  Pt will report 50% reduction of pain due to improvements in posture, strength, and muscle length in abdomen and pelvis to improve quality of life.  Baseline:  Goal status: INITIAL  LONG TERM GOALS: Target date: 03/16/2024  Pt will be independent with advanced HEP.  Baseline:  Goal status: INITIAL  2.  Pt to demonstrate improved coordination of pelvic floor and breathing mechanics with 10# squat with appropriate synergistic patterns to decrease pain and leakage at least 75% of the time for improved ability to complete a 30 minute workout with strain at pelvic floor and symptoms.   Baseline:  Goal status: INITIAL  3.  Pt will have to use less than 2 diapers per day to save on  spending from purchasing diapers and to suggest improved urinary control/function.  Baseline:  Goal status: INITIAL  4.  Pt will be able to lift at least 10 lb correctly for 10 reps without pain or leakage for functional activities and to allow patient to perform yard/house work without urinary leakage being a limiting factor.  Baseline:  Goal status: INITIAL  PLAN:  PT FREQUENCY: 1-2x/week  PT DURATION: 6 months  PLANNED INTERVENTIONS: 97110-Therapeutic exercises, 97530- Therapeutic activity, 97112- Neuromuscular re-education, 97535- Self Care, [id number]- Manual therapy, Patient/Family education, Balance training, Stair training, Taping, Joint mobilization, Spinal mobilization, Scar mobilization, Cryotherapy, Moist  heat, and Biofeedback  PLAN FOR NEXT SESSION: continued pelvic floor AROM in seated, scar site manual therapy, core strengthening and stretching, toileting mechanics for optimal emptying, urge drill   Celena Domino, PT, DPT 02/18/2024 2:33 PM [facility] [address] [place], [place] [address] Phone # [phone] Fax [phone]

## 2024-02-25 ENCOUNTER — Ambulatory Visit: Admitting: Physical Therapy

## 2024-02-25 DIAGNOSIS — M62838 Other muscle spasm: Secondary | ICD-10-CM

## 2024-02-25 DIAGNOSIS — R293 Abnormal posture: Secondary | ICD-10-CM

## 2024-02-25 DIAGNOSIS — R279 Unspecified lack of coordination: Secondary | ICD-10-CM

## 2024-02-25 NOTE — Therapy (Signed)
 OUTPATIENT PHYSICAL THERAPY MALE PELVIC TREATMENT   Patient Name: [name] MRN: [id number] DOB:01-30-1956, 68 y.o., male Today's Date: 02/25/2024  END OF SESSION:  PT End of Session - 02/25/24 1441     Visit Number 6    Number of Visits 8    Date for Recertification  02/09/24    Authorization Type [employer] MCR ([employer] Approved 8 visits-12/15/2023-03/14/2024-auth#[id number])    Authorization - Visit Number 6    Authorization - Number of Visits 8    Progress Note Due on Visit 10    PT Start Time 0200    PT Stop Time 0245    PT Time Calculation (min) 45 min    Activity Tolerance Patient tolerated treatment well    Behavior During Therapy WFL for tasks assessed/performed               Past Medical History:  Diagnosis Date   Anxiety    Arthritis    Cancer (HCC)    prostate cancer   Current smoker    Degenerative cervical disc    Depression    ED (erectile dysfunction)    Elevated PSA    Headache    HLD (hyperlipidemia)    Myocardial infarction (HCC)    PONV (postoperative nausea and vomiting)    Pre-diabetes    Prediabetes    Spinal stenosis    Past Surgical History:  Procedure Laterality Date   LAPAROSCOPIC TOTAL PELVIC LYMPHADENECTOMY N/A 12/03/2023   Procedure: LAPAROSCOPIC BILATERAL PELVIC LYMPHADENECTOMY;  Surgeon: Shane Steffan BROCKS, MD;  Location: [facility];  Service: Urology;  Laterality: N/A;   LEFT HEART CATH AND CORONARY ANGIOGRAPHY N/A 05/18/2023   Procedure: LEFT HEART CATH AND CORONARY ANGIOGRAPHY;  Surgeon: Mady Bruckner, MD;  Location: [facility];  Service: Cardiovascular;  Laterality: N/A;   MANDIBLE FRACTURE SURGERY     ROBOT ASSISTED LAPAROSCOPIC RADICAL PROSTATECTOMY N/A 12/03/2023   Procedure: ROBOTIC ASSISTED LAPAROSCOPIC RADICAL PROSTATECTOMY;  Surgeon: Shane Steffan BROCKS, MD;  Location: [facility];  Service: Urology;  Laterality: N/A;   SPLENECTOMY     UMBILICAL HERNIA REPAIR     Patient Active Problem List   Diagnosis Date  Noted   Prostate cancer (HCC) 09/03/2023   Leukocytosis 06/05/2023   Night sweats 06/05/2023   Quit smoking in March 2025 06/05/2023   Spinal stenosis of lumbar region 08/27/2020   Impingement syndrome of right shoulder 12/24/2016   Chronic right shoulder pain 11/26/2016   Neck pain 11/04/2016   Chronic bilateral low back pain with bilateral sciatica 11/04/2016    PCP: Gerome Brunet, DO  REFERRING PROVIDER: Claudene Waddell HERO, PA-C   REFERRING DIAG: JOSH (ICD-10-CM) - Malignant neoplasm of prostate  THERAPY DIAG:  Other muscle spasm  Unspecified lack of coordination  Abnormal posture  Rationale for Evaluation and Treatment: Rehabilitation  ONSET DATE: 12/03/23  SUBJECTIVE:  SUBJECTIVE STATEMENT: Patient reports that he is doing well today. He has been going every hour during the day, nighttime is still good, other than being restless. When he wakes in the night he is usually dry. Still getting up 3-4 times in the night.  No bowel issues to report. He has been consistent with his HEP. Double voiding technique is working.   Eval: Patient reports that he had a prostatectomy and lymph node removal on September 25th, 2025. He is feeling sore in the surgical sites and heaviness in the pelvis/abdomen. He feels like he urinates constantly and is unable to control his urine.  Fluid intake: 4 cups of water  per day, sweet tea every once in a while, 2 cups of coffee per day  PAIN:  Are you having pain? No NPRS scale: 3-4/10 Pain location: Internal, Right, and Anterior  Pain type: aching and dull Pain description: intermittent   Aggravating factors: coughing  Relieving factors: pain relievers, tylenol    PRECAUTIONS: None  RED FLAGS: None and Bowel or bladder incontinence: Yes: secondary to  prostatectomy    WEIGHT BEARING RESTRICTIONS: No  FALLS:  Has patient fallen in last 6 months? No  OCCUPATION: retired   PLOF: Independent  PATIENT GOALS: to lessen the leakage, to strengthen the bladder musculature   PERTINENT HISTORY:  Spleen removal in 2015, hernia repair   BOWEL MOVEMENT: Pain with bowel movement: No Type of bowel movement:Type (Bristol Stool Scale) 4-6, Frequency 1-3, Strain tries not to , and Splinting no Fully empty rectum: No Leakage: No Pads: No Fiber supplement: Yes: miralax  if needed   URINATION: Pain with urination: No Fully empty bladder: No Stream: Strong Urgency: Yes:   Frequency: more frequently than in the past  Leakage: Urge to void, Walking to the bathroom, Coughing, Laughing, Lifting, and Bending forward and in the nighttime  Pads: Yes: 2 diapers and 5 pads per day   INTERCOURSE: not planning to return to sexual activity  OBJECTIVE:  Note: Objective measures were completed at Evaluation unless otherwise noted.  PATIENT SURVEYS:  PFIQ-7 65  COGNITION: Overall cognitive status: Within functional limits for tasks assessed     SENSATION: Light touch: Appears intact Proprioception: Appears intact  MUSCLE LENGTH: within normal limits for all motions tested bilaterally with no pain   LUMBAR SPECIAL TESTS:  Single leg stance test: Positive  FUNCTIONAL TESTS:  Squat: bilateral dynamic knee valgus with loading   GAIT: Assistive device utilized: None Level of assistance: Complete Independence Comments: moderate trendelenburg gait pattern with ambulation   POSTURE: rounded shoulders and forward head  PELVIC ALIGNMENT: within normal limits bilaterally   LUMBARAROM/PROM:  A/PROM A/PROM  eval  Flexion 75% available  Extension 75% available  Right lateral flexion WNL  Left lateral flexion WNL  Right rotation 75% available  Left rotation 75% available   (Blank rows = not tested)  LOWER EXTREMITY AROM/PROM: within normal  limits for all motions tested bilaterally with no pain  LOWER EXTREMITY MMT: 3+/5 bilateral knees and hips grossly  PALPATION: GENERAL no tenderness to palpation of abdominal scars secondary to prostatectomy               External exam: apical breathing pattern with decreased lower rib excursion during inhalation/exhalation              Internal Pelvic Floor not performed today due to time limitations  Patient confirms identification and approves PT to assess internal pelvic floor and treatment No  PELVIC MMT: not performed today  due to time limitations    MMT eval  Internal Anal Sphincter   External Anal Sphincter   Puborectalis   Diastasis Recti 2 finger widths at umbilicus   (Blank rows = not tested)  TONE: High throughout abdominal region  TODAY'S TREATMENT:                                                                                                                              DATE:   01/28/24: Internal rectal examination: Patient confirms identification and approves physical therapist to perform internal soft tissue work   Patient demonstrates a strong PF contraction in sidelying with strong coordination. Pt able to complete 10 quick flicks in rapid succession. No palpable trigger points in rectal canal, normal tone in puborectalis  Hooklying diaphragmatic breathing + pelvic floor lengthening with inhale + shortening with exhale 2x10  Hooklying pelvic floor quick flicks + diaphragmatic breathing 2x10  Knack technique for stress urinary incontinence management   02/18/24: Seated pelvic floor contraction + diaphragmatic breathing 2x7min  Seated quick flick pelvic floor contractions + diaphragmatic breathing x15  Standing pelvic floor contraction + diaphragmatic breathing 2x10  Standing quick flick pelvic floor contractions + diaphragmatic breathing x15  Sit to stand + diaphragmatic breathing x10  Sit to stand + adductor ball squeeze + diaphragmatic breathing x10  Sit  to stand + adductor ball squeeze + pelvic floor contraction + diaphragmatic breathing 2x10  Bridge + adductor ball squeeze + diaphragmatic breathing 2x56min  Seated adductor ball squeeze + diaphragmatic breathing 2x18min  Manual to abdominal scars to promote blood flow to abdomen and decrease scar tissue restriction in abdomen Deep diaphragmatic breathing while cups are in place   03/06/24: Education to limit fluid intake 1.5-2 hours before bedtime  Manual to abdominal scars to promote blood flow to abdomen and decrease scar tissue restriction in abdomen Deep diaphragmatic breathing while cups are in place  Bridge + hip abduction (GTB) + diaphragmatic breathing 2x10  Seated hip abduction (GTB) + diaphragmatic breathing 2x10  Seated hip internal rotation against ball + diaphragmatic breathing 2x10 alternating sides  Seated hip adduction + diaphragmatic breathing x74min  Sit to stand + GTB hip abduction + diaphragmatic breathing 2x10   PATIENT EDUCATION:  Education details: Water  intake recommendation: first thing in morning, last thing at night, ounces based on body weight number; Double voiding technique for emptying bladder when urinating; Bladder training schedule: every hour for 1 week, then increasing to every 1.5 hours, 2 hours, etc Person educated: Patient Education method: Explanation, Demonstration, Tactile cues, Verbal cues, and Handouts Education comprehension: verbalized understanding, returned demonstration, verbal cues required, tactile cues required, and needs further education  HOME EXERCISE PROGRAM: Access Code: [email or web address] URL: [email or web address] Date: 02/25/2024 Prepared by: Celena Domino  Exercises - Seated Pelvic Floor Contraction  - 1 x daily - 7 x weekly - 2 sets - 10 reps - Standing Pelvic Floor Contraction  - 1 x daily - 7 x  weekly - 1 sets - 10 reps - Quick Flick Pelvic Floor Contractions Seated    - 1 x daily - 7 x weekly - 2 sets - 10 reps - Supine  Bridge with Mini Swiss Ball Between Knees  - 1 x daily - 7 x weekly - 1 sets - 10 reps - Supine Bridge with Resistance Band  - 1 x daily - 7 x weekly - 2 sets - 10 reps - Seated Hip Abduction with Resistance  - 1 x daily - 7 x weekly - 2 sets - 12 reps - Seated Hip Internal Rotation with Ball and Resistance  - 1 x daily - 7 x weekly - 2 sets - 10 reps - Sit to Stand with Resistance Around Legs  - 1 x daily - 7 x weekly - 2 sets - 10 reps - Seated Hip Adduction Isometrics with Ball  - 1 x daily - 7 x weekly - 2 sets - 10 reps - Supine Butterfly Groin Stretch  - 1 x daily - 7 x weekly - 2 sets - hold - Sidelying Thoracic Rotation with Open Book  - 1 x daily - 7 x weekly - 2 sets - 10 reps  ASSESSMENT:  CLINICAL IMPRESSION: Racer presents to skilled therapy with no complaints of pain or discomfort. He has been trying to stay on his 30 minute voiding schedule - he is slowly working towards the 1 hour voiding interval goal. He has been more consistent with HEP recently. Double voiding technique is helping significantly with emptying the bladder in the mornings. Pt advised to limit fluid intake before bedtime to prevent nighttime urinary frequency. Exercises progressed in intensity today with addition of gravitational load, amount of repetitions, and addition of band with the sit to stand exercise. Pt advised to continue voiding intervals as he can. Manual therapy to abdominal scars revealed increased tension at central linea alba scar compared to others in abdominal wall, no pain with this though. Patient required frequent verbal cues for correct breathing technique while performing exercises. Patient will benefit from skilled PT to address the below impairments and improve overall function.  OBJECTIVE IMPAIRMENTS: decreased coordination, decreased endurance, decreased mobility, decreased ROM, decreased strength, and pain.   ACTIVITY LIMITATIONS: carrying, lifting, bending, sitting, standing,  squatting, stairs, transfers, bed mobility, continence, and toileting  PARTICIPATION LIMITATIONS: interpersonal relationship and community activity  PERSONAL FACTORS: Age, Past/current experiences, and Time since onset of injury/illness/exacerbation are also affecting patient's functional outcome.   REHAB POTENTIAL: Good  CLINICAL DECISION MAKING: Stable/uncomplicated  EVALUATION COMPLEXITY: Low   GOALS: Goals reviewed with patient? Yes  SHORT TERM GOALS: Target date: 01/12/2024  Pt will be independent with HEP.  Baseline: Goal status: INITIAL  2.  Pt will be independent with use of squatty potty, relaxed toileting mechanics, and improved bowel movement techniques in order to increase ease of bowel movements and complete evacuation.  Baseline:  Goal status: INITIAL  3.  Pt will be independent with the knack, urge suppression technique, and double voiding in order to improve bladder habits and decrease urinary incontinence.  Baseline:  Goal status: INITIAL  4.  Pt will report 50% reduction of pain due to improvements in posture, strength, and muscle length in abdomen and pelvis to improve quality of life.  Baseline:  Goal status: INITIAL  LONG TERM GOALS: Target date: 03/16/2024  Pt will be independent with advanced HEP.  Baseline:  Goal status: INITIAL  2.  Pt to demonstrate improved coordination of  pelvic floor and breathing mechanics with 10# squat with appropriate synergistic patterns to decrease pain and leakage at least 75% of the time for improved ability to complete a 30 minute workout with strain at pelvic floor and symptoms.   Baseline:  Goal status: INITIAL  3.  Pt will have to use less than 2 diapers per day to save on spending from purchasing diapers and to suggest improved urinary control/function.  Baseline:  Goal status: INITIAL  4.  Pt will be able to lift at least 10 lb correctly for 10 reps without pain or leakage for functional activities and to allow  patient to perform yard/house work without urinary leakage being a limiting factor.  Baseline:  Goal status: INITIAL  PLAN:  PT FREQUENCY: 1-2x/week  PT DURATION: 6 months  PLANNED INTERVENTIONS: 97110-Therapeutic exercises, 97530- Therapeutic activity, 97112- Neuromuscular re-education, 97535- Self Care, [id number]- Manual therapy, Patient/Family education, Balance training, Stair training, Taping, Joint mobilization, Spinal mobilization, Scar mobilization, Cryotherapy, Moist heat, and Biofeedback  PLAN FOR NEXT SESSION: continued pelvic floor AROM in seated, scar site manual therapy, core strengthening and stretching, toileting mechanics for optimal emptying, urge drill   Celena Domino, PT, DPT 02/25/2024 2:42 PM [facility] [address], [address] [place], [place] [address] Phone # [phone] Fax [phone]

## 2024-03-08 ENCOUNTER — Encounter: Admitting: Physical Therapy

## 2024-03-28 ENCOUNTER — Other Ambulatory Visit (HOSPITAL_BASED_OUTPATIENT_CLINIC_OR_DEPARTMENT_OTHER): Payer: Self-pay | Admitting: Nurse Practitioner

## 2024-05-11 ENCOUNTER — Inpatient Hospital Stay

## 2024-05-11 ENCOUNTER — Inpatient Hospital Stay: Admitting: Oncology

## 2024-06-14 ENCOUNTER — Ambulatory Visit
